# Patient Record
Sex: Female | Born: 1965
Health system: Southern US, Community
[De-identification: ages and names within clinical notes are randomized; demographics above are authoritative.]

## PROBLEM LIST (undated history)

## (undated) DIAGNOSIS — M797 Fibromyalgia: Secondary | ICD-10-CM

## (undated) DIAGNOSIS — M51369 Other intervertebral disc degeneration, lumbar region without mention of lumbar back pain or lower extremity pain: Secondary | ICD-10-CM

## (undated) DIAGNOSIS — D259 Leiomyoma of uterus, unspecified: Secondary | ICD-10-CM

## (undated) DIAGNOSIS — R209 Unspecified disturbances of skin sensation: Secondary | ICD-10-CM

## (undated) DIAGNOSIS — M5136 Other intervertebral disc degeneration, lumbar region: Secondary | ICD-10-CM

## (undated) DIAGNOSIS — G43909 Migraine, unspecified, not intractable, without status migrainosus: Secondary | ICD-10-CM

## (undated) DIAGNOSIS — J45909 Unspecified asthma, uncomplicated: Secondary | ICD-10-CM

## (undated) DIAGNOSIS — K219 Gastro-esophageal reflux disease without esophagitis: Secondary | ICD-10-CM

## (undated) HISTORY — DX: Migraine, unspecified, not intractable, without status migrainosus: G43.909

## (undated) HISTORY — PX: HAND SURGERY: SHX662

## (undated) HISTORY — DX: Unspecified disturbances of skin sensation: R20.9

## (undated) HISTORY — PX: CARPAL TUNNEL RELEASE: SHX101

## (undated) HISTORY — PX: CYSTECTOMY: SUR359

## (undated) HISTORY — DX: Other intervertebral disc degeneration, lumbar region: M51.36

## (undated) HISTORY — DX: Unspecified asthma, uncomplicated: J45.909

## (undated) HISTORY — PX: SHOULDER SURGERY: SHX246

---

## 1998-01-18 ENCOUNTER — Emergency Department (HOSPITAL_COMMUNITY): Admission: EM | Admit: 1998-01-18 | Discharge: 1998-01-18 | Payer: Self-pay | Admitting: Emergency Medicine

## 1998-01-25 ENCOUNTER — Inpatient Hospital Stay (HOSPITAL_COMMUNITY): Admission: EM | Admit: 1998-01-25 | Discharge: 1998-01-27 | Payer: Self-pay | Admitting: Emergency Medicine

## 1998-03-13 ENCOUNTER — Emergency Department (HOSPITAL_COMMUNITY): Admission: EM | Admit: 1998-03-13 | Discharge: 1998-03-13 | Payer: Self-pay | Admitting: Emergency Medicine

## 1998-03-16 ENCOUNTER — Ambulatory Visit (HOSPITAL_COMMUNITY): Admission: RE | Admit: 1998-03-16 | Discharge: 1998-03-16 | Payer: Self-pay | Admitting: Family Medicine

## 1998-04-04 ENCOUNTER — Ambulatory Visit (HOSPITAL_COMMUNITY): Admission: RE | Admit: 1998-04-04 | Discharge: 1998-04-04 | Payer: Self-pay | Admitting: Gastroenterology

## 1998-12-04 ENCOUNTER — Ambulatory Visit (HOSPITAL_COMMUNITY): Admission: RE | Admit: 1998-12-04 | Discharge: 1998-12-04 | Payer: Self-pay | Admitting: *Deleted

## 2000-02-13 ENCOUNTER — Other Ambulatory Visit: Admission: RE | Admit: 2000-02-13 | Discharge: 2000-02-13 | Payer: Self-pay | Admitting: Obstetrics and Gynecology

## 2000-02-13 ENCOUNTER — Other Ambulatory Visit: Admission: RE | Admit: 2000-02-13 | Discharge: 2000-02-13 | Payer: Self-pay | Admitting: Obstetrics & Gynecology

## 2000-02-15 ENCOUNTER — Ambulatory Visit (HOSPITAL_COMMUNITY): Admission: AD | Admit: 2000-02-15 | Discharge: 2000-02-15 | Payer: Self-pay | Admitting: Obstetrics & Gynecology

## 2000-02-15 ENCOUNTER — Encounter (INDEPENDENT_AMBULATORY_CARE_PROVIDER_SITE_OTHER): Payer: Self-pay

## 2000-04-15 ENCOUNTER — Encounter: Payer: Self-pay | Admitting: Internal Medicine

## 2000-04-15 ENCOUNTER — Encounter: Admission: RE | Admit: 2000-04-15 | Discharge: 2000-04-15 | Payer: Self-pay | Admitting: Internal Medicine

## 2000-10-16 ENCOUNTER — Encounter: Payer: Self-pay | Admitting: Internal Medicine

## 2000-10-16 ENCOUNTER — Encounter: Admission: RE | Admit: 2000-10-16 | Discharge: 2000-10-16 | Payer: Self-pay | Admitting: Internal Medicine

## 2000-10-21 ENCOUNTER — Encounter: Admission: RE | Admit: 2000-10-21 | Discharge: 2000-10-21 | Payer: Self-pay | Admitting: Internal Medicine

## 2000-10-21 ENCOUNTER — Encounter: Payer: Self-pay | Admitting: Internal Medicine

## 2001-04-30 ENCOUNTER — Encounter: Payer: Self-pay | Admitting: Internal Medicine

## 2001-04-30 ENCOUNTER — Encounter: Admission: RE | Admit: 2001-04-30 | Discharge: 2001-04-30 | Payer: Self-pay | Admitting: Internal Medicine

## 2001-05-05 ENCOUNTER — Encounter: Admission: RE | Admit: 2001-05-05 | Discharge: 2001-05-05 | Payer: Self-pay | Admitting: Internal Medicine

## 2001-05-05 ENCOUNTER — Encounter: Payer: Self-pay | Admitting: Internal Medicine

## 2001-05-10 ENCOUNTER — Other Ambulatory Visit: Admission: RE | Admit: 2001-05-10 | Discharge: 2001-05-10 | Payer: Self-pay | Admitting: Obstetrics and Gynecology

## 2001-05-12 ENCOUNTER — Encounter (INDEPENDENT_AMBULATORY_CARE_PROVIDER_SITE_OTHER): Payer: Self-pay

## 2001-05-12 ENCOUNTER — Ambulatory Visit (HOSPITAL_COMMUNITY): Admission: RE | Admit: 2001-05-12 | Discharge: 2001-05-12 | Payer: Self-pay | Admitting: Obstetrics and Gynecology

## 2001-07-30 ENCOUNTER — Encounter: Payer: Self-pay | Admitting: Internal Medicine

## 2001-07-30 ENCOUNTER — Encounter: Admission: RE | Admit: 2001-07-30 | Discharge: 2001-07-30 | Payer: Self-pay | Admitting: Internal Medicine

## 2001-08-15 ENCOUNTER — Emergency Department (HOSPITAL_COMMUNITY): Admission: EM | Admit: 2001-08-15 | Discharge: 2001-08-15 | Payer: Self-pay | Admitting: Emergency Medicine

## 2001-08-15 ENCOUNTER — Encounter: Payer: Self-pay | Admitting: Emergency Medicine

## 2001-11-26 ENCOUNTER — Encounter: Payer: Self-pay | Admitting: Internal Medicine

## 2001-11-26 ENCOUNTER — Encounter: Admission: RE | Admit: 2001-11-26 | Discharge: 2001-11-26 | Payer: Self-pay | Admitting: Internal Medicine

## 2001-12-23 ENCOUNTER — Encounter: Payer: Self-pay | Admitting: Emergency Medicine

## 2001-12-23 ENCOUNTER — Emergency Department (HOSPITAL_COMMUNITY): Admission: EM | Admit: 2001-12-23 | Discharge: 2001-12-23 | Payer: Self-pay | Admitting: Emergency Medicine

## 2003-02-09 ENCOUNTER — Encounter: Payer: Self-pay | Admitting: Internal Medicine

## 2003-02-09 ENCOUNTER — Encounter: Admission: RE | Admit: 2003-02-09 | Discharge: 2003-02-09 | Payer: Self-pay | Admitting: Internal Medicine

## 2004-02-28 ENCOUNTER — Ambulatory Visit (HOSPITAL_COMMUNITY): Admission: RE | Admit: 2004-02-28 | Discharge: 2004-02-28 | Payer: Self-pay | Admitting: Internal Medicine

## 2004-10-02 ENCOUNTER — Ambulatory Visit (HOSPITAL_COMMUNITY): Admission: RE | Admit: 2004-10-02 | Discharge: 2004-10-02 | Payer: Self-pay | Admitting: Anesthesiology

## 2006-10-16 ENCOUNTER — Emergency Department (HOSPITAL_COMMUNITY): Admission: EM | Admit: 2006-10-16 | Discharge: 2006-10-16 | Payer: Self-pay | Admitting: Emergency Medicine

## 2007-05-12 ENCOUNTER — Emergency Department (HOSPITAL_COMMUNITY): Admission: EM | Admit: 2007-05-12 | Discharge: 2007-05-12 | Payer: Self-pay | Admitting: Family Medicine

## 2007-09-18 ENCOUNTER — Emergency Department (HOSPITAL_COMMUNITY): Admission: EM | Admit: 2007-09-18 | Discharge: 2007-09-19 | Payer: Self-pay | Admitting: Emergency Medicine

## 2007-10-18 ENCOUNTER — Ambulatory Visit: Payer: Self-pay | Admitting: Gastroenterology

## 2007-10-18 LAB — CONVERTED CEMR LAB
AST: 16 units/L (ref 0–37)
Albumin: 3.7 g/dL (ref 3.5–5.2)
Amylase: 245 units/L — ABNORMAL HIGH (ref 27–131)
Lipase: 41 units/L (ref 11.0–59.0)
Total Bilirubin: 0.4 mg/dL (ref 0.3–1.2)
Total Protein: 7 g/dL (ref 6.0–8.3)

## 2007-10-26 ENCOUNTER — Ambulatory Visit: Payer: Self-pay | Admitting: Gastroenterology

## 2007-11-02 ENCOUNTER — Ambulatory Visit (HOSPITAL_COMMUNITY): Admission: RE | Admit: 2007-11-02 | Discharge: 2007-11-02 | Payer: Self-pay | Admitting: Gastroenterology

## 2007-11-15 ENCOUNTER — Ambulatory Visit: Payer: Self-pay | Admitting: Gastroenterology

## 2008-04-22 ENCOUNTER — Emergency Department (HOSPITAL_COMMUNITY): Admission: EM | Admit: 2008-04-22 | Discharge: 2008-04-22 | Payer: Self-pay | Admitting: Family Medicine

## 2008-06-02 ENCOUNTER — Emergency Department (HOSPITAL_COMMUNITY): Admission: EM | Admit: 2008-06-02 | Discharge: 2008-06-02 | Payer: Self-pay | Admitting: Emergency Medicine

## 2008-06-15 ENCOUNTER — Emergency Department (HOSPITAL_COMMUNITY): Admission: EM | Admit: 2008-06-15 | Discharge: 2008-06-16 | Payer: Self-pay | Admitting: Emergency Medicine

## 2008-06-26 ENCOUNTER — Emergency Department (HOSPITAL_COMMUNITY): Admission: EM | Admit: 2008-06-26 | Discharge: 2008-06-26 | Payer: Self-pay | Admitting: Emergency Medicine

## 2008-10-22 ENCOUNTER — Encounter (INDEPENDENT_AMBULATORY_CARE_PROVIDER_SITE_OTHER): Payer: Self-pay | Admitting: *Deleted

## 2008-10-22 ENCOUNTER — Emergency Department (HOSPITAL_COMMUNITY): Admission: EM | Admit: 2008-10-22 | Discharge: 2008-10-23 | Payer: Self-pay | Admitting: Emergency Medicine

## 2008-10-23 ENCOUNTER — Encounter (INDEPENDENT_AMBULATORY_CARE_PROVIDER_SITE_OTHER): Payer: Self-pay | Admitting: *Deleted

## 2008-10-30 ENCOUNTER — Telehealth: Payer: Self-pay | Admitting: Gastroenterology

## 2008-10-30 ENCOUNTER — Emergency Department (HOSPITAL_COMMUNITY): Admission: EM | Admit: 2008-10-30 | Discharge: 2008-10-30 | Payer: Self-pay | Admitting: Emergency Medicine

## 2008-10-30 ENCOUNTER — Encounter (INDEPENDENT_AMBULATORY_CARE_PROVIDER_SITE_OTHER): Payer: Self-pay | Admitting: *Deleted

## 2008-10-31 DIAGNOSIS — R51 Headache: Secondary | ICD-10-CM

## 2008-10-31 DIAGNOSIS — R109 Unspecified abdominal pain: Secondary | ICD-10-CM | POA: Insufficient documentation

## 2008-10-31 DIAGNOSIS — R519 Headache, unspecified: Secondary | ICD-10-CM | POA: Insufficient documentation

## 2008-10-31 DIAGNOSIS — F329 Major depressive disorder, single episode, unspecified: Secondary | ICD-10-CM

## 2008-10-31 DIAGNOSIS — M1990 Unspecified osteoarthritis, unspecified site: Secondary | ICD-10-CM | POA: Insufficient documentation

## 2008-10-31 DIAGNOSIS — IMO0001 Reserved for inherently not codable concepts without codable children: Secondary | ICD-10-CM

## 2008-10-31 DIAGNOSIS — Z8669 Personal history of other diseases of the nervous system and sense organs: Secondary | ICD-10-CM

## 2008-11-01 ENCOUNTER — Ambulatory Visit: Payer: Self-pay | Admitting: Gastroenterology

## 2008-11-08 ENCOUNTER — Telehealth: Payer: Self-pay | Admitting: Gastroenterology

## 2008-11-08 DIAGNOSIS — R141 Gas pain: Secondary | ICD-10-CM | POA: Insufficient documentation

## 2008-11-08 DIAGNOSIS — R1013 Epigastric pain: Secondary | ICD-10-CM

## 2008-11-08 DIAGNOSIS — R142 Eructation: Secondary | ICD-10-CM

## 2008-11-08 DIAGNOSIS — R143 Flatulence: Secondary | ICD-10-CM

## 2008-11-24 ENCOUNTER — Ambulatory Visit (HOSPITAL_COMMUNITY): Admission: RE | Admit: 2008-11-24 | Discharge: 2008-11-24 | Payer: Self-pay | Admitting: Gastroenterology

## 2008-12-06 ENCOUNTER — Ambulatory Visit: Payer: Self-pay | Admitting: Gastroenterology

## 2008-12-06 ENCOUNTER — Encounter: Payer: Self-pay | Admitting: Gastroenterology

## 2009-04-03 ENCOUNTER — Emergency Department (HOSPITAL_COMMUNITY): Admission: EM | Admit: 2009-04-03 | Discharge: 2009-04-03 | Payer: Self-pay | Admitting: Emergency Medicine

## 2009-04-04 ENCOUNTER — Emergency Department (HOSPITAL_COMMUNITY): Admission: EM | Admit: 2009-04-04 | Discharge: 2009-04-04 | Payer: Self-pay | Admitting: Family Medicine

## 2009-04-08 ENCOUNTER — Emergency Department (HOSPITAL_COMMUNITY): Admission: EM | Admit: 2009-04-08 | Discharge: 2009-04-08 | Payer: Self-pay | Admitting: Emergency Medicine

## 2009-04-28 ENCOUNTER — Emergency Department (HOSPITAL_COMMUNITY): Admission: EM | Admit: 2009-04-28 | Discharge: 2009-04-28 | Payer: Self-pay | Admitting: Emergency Medicine

## 2009-05-29 ENCOUNTER — Encounter: Admission: RE | Admit: 2009-05-29 | Discharge: 2009-05-29 | Payer: Self-pay | Admitting: Specialist

## 2009-08-09 ENCOUNTER — Emergency Department (HOSPITAL_COMMUNITY): Admission: EM | Admit: 2009-08-09 | Discharge: 2009-08-09 | Payer: Self-pay | Admitting: Emergency Medicine

## 2009-08-20 ENCOUNTER — Emergency Department (HOSPITAL_COMMUNITY): Admission: EM | Admit: 2009-08-20 | Discharge: 2009-08-21 | Payer: Self-pay | Admitting: Emergency Medicine

## 2009-12-04 ENCOUNTER — Emergency Department (HOSPITAL_COMMUNITY): Admission: EM | Admit: 2009-12-04 | Discharge: 2009-12-04 | Payer: Self-pay | Admitting: Emergency Medicine

## 2010-01-08 ENCOUNTER — Emergency Department (HOSPITAL_COMMUNITY): Admission: EM | Admit: 2010-01-08 | Discharge: 2010-01-08 | Payer: Self-pay | Admitting: Emergency Medicine

## 2010-02-06 ENCOUNTER — Emergency Department (HOSPITAL_COMMUNITY): Admission: EM | Admit: 2010-02-06 | Discharge: 2010-02-06 | Payer: Self-pay | Admitting: Emergency Medicine

## 2010-04-30 ENCOUNTER — Emergency Department (HOSPITAL_COMMUNITY): Admission: EM | Admit: 2010-04-30 | Discharge: 2010-05-01 | Payer: Self-pay | Admitting: Emergency Medicine

## 2010-08-11 ENCOUNTER — Emergency Department (HOSPITAL_COMMUNITY)
Admission: EM | Admit: 2010-08-11 | Discharge: 2010-08-11 | Payer: Self-pay | Source: Home / Self Care | Admitting: Emergency Medicine

## 2010-10-17 LAB — COMPREHENSIVE METABOLIC PANEL
ALT: 10 U/L (ref 0–35)
AST: 16 U/L (ref 0–37)
BUN: 8 mg/dL (ref 6–23)
CO2: 22 mEq/L (ref 19–32)
GFR calc Af Amer: >60 mL/min (ref >60)
GFR calc non Af Amer: >60 mL/min (ref >60)
Glucose, Bld: 90 mg/dL (ref 70–99)
Potassium: 3.3 mEq/L — ABNORMAL LOW (ref 3.5–5.1)

## 2010-10-17 LAB — CBC
HCT: 37.5 % (ref 36.0–46.0)
MCHC: 32.4 g/dL (ref 30.0–36.0)
MCV: 80.5 fL (ref 78.0–100.0)
Platelets: 357 10*3/uL (ref 150–400)
RBC: 4.66 MIL/uL (ref 3.87–5.11)
RDW: 14.8 % (ref 11.5–15.5)
WBC: 7.6 10*3/uL (ref 4.0–10.5)

## 2010-10-17 LAB — DIFFERENTIAL
Basophils Relative: 0 % (ref 0–1)
Eosinophils Absolute: 0.1 10*3/uL (ref 0.0–0.7)
Lymphocytes Relative: 26 % (ref 12–46)
Neutro Abs: 5.2 10*3/uL (ref 1.7–7.7)
Neutrophils Relative %: 69 % (ref 43–77)

## 2010-10-17 LAB — URINALYSIS, ROUTINE W REFLEX MICROSCOPIC
Bilirubin Urine: NEGATIVE
Glucose, UA: NEGATIVE mg/dL
Nitrite: NEGATIVE
pH: 6 (ref 5.0–8.0)

## 2010-10-20 LAB — CBC
HCT: 39.7 % (ref 36.0–46.0)
HCT: 39.5 % (ref 36.0–46.0)
Hemoglobin: 13.1 g/dL (ref 12.0–15.0)
MCH: 25.7 pg — ABNORMAL LOW (ref 26.0–34.0)
MCHC: 32.8 g/dL (ref 30.0–36.0)
MCHC: 33.3 g/dL (ref 30.0–36.0)
MCV: 78.3 fL (ref 78.0–100.0)
MCV: 77.6 fL — ABNORMAL LOW (ref 78.0–100.0)
MCV: 78.6 fL (ref 78.0–100.0)
Platelets: 282 10*3/uL (ref 150–400)
Platelets: 347 10*3/uL (ref 150–400)
RBC: 5.09 MIL/uL (ref 3.87–5.11)
RBC: 5.11 MIL/uL (ref 3.87–5.11)
RDW: 14.9 % (ref 11.5–15.5)
RDW: 14.8 % (ref 11.5–15.5)
WBC: 6.3 10*3/uL (ref 4.0–10.5)
WBC: 9.2 10*3/uL (ref 4.0–10.5)
WBC: 9.8 10*3/uL (ref 4.0–10.5)

## 2010-10-20 LAB — COMPREHENSIVE METABOLIC PANEL
ALT: 18 U/L (ref 0–35)
ALT: 16 U/L (ref 0–35)
AST: 20 U/L (ref 0–37)
AST: 20 U/L (ref 0–37)
Albumin: 4.2 g/dL (ref 3.5–5.2)
Alkaline Phosphatase: 37 U/L — ABNORMAL LOW (ref 39–117)
Alkaline Phosphatase: 43 U/L (ref 39–117)
Alkaline Phosphatase: 40 U/L (ref 39–117)
BUN: 15 mg/dL (ref 6–23)
BUN: 7 mg/dL (ref 6–23)
CO2: 27 mEq/L (ref 19–32)
CO2: 25 mEq/L (ref 19–32)
Calcium: 9.6 mg/dL (ref 8.4–10.5)
Chloride: 100 mEq/L (ref 96–112)
Chloride: 102 mEq/L (ref 96–112)
Creatinine, Ser: 0.66 mg/dL (ref 0.4–1.2)
Creatinine, Ser: 0.66 mg/dL (ref 0.4–1.2)
GFR calc Af Amer: >60 mL/min (ref >60)
GFR calc Af Amer: >60 mL/min (ref >60)
GFR calc non Af Amer: >60 mL/min (ref >60)
GFR calc non Af Amer: >60 mL/min (ref >60)
Glucose, Bld: 89 mg/dL (ref 70–99)
Glucose, Bld: 114 mg/dL — ABNORMAL HIGH (ref 70–99)
Potassium: 3.4 mEq/L — ABNORMAL LOW (ref 3.5–5.1)
Potassium: 3.7 mEq/L (ref 3.5–5.1)
Potassium: 3.5 mEq/L (ref 3.5–5.1)
Sodium: 135 mEq/L (ref 135–145)
Sodium: 133 mEq/L — ABNORMAL LOW (ref 135–145)
Total Bilirubin: 0.9 mg/dL (ref 0.3–1.2)
Total Bilirubin: 0.6 mg/dL (ref 0.3–1.2)
Total Protein: 8.2 g/dL (ref 6.0–8.3)
Total Protein: 8.4 g/dL — ABNORMAL HIGH (ref 6.0–8.3)

## 2010-10-20 LAB — URINALYSIS, ROUTINE W REFLEX MICROSCOPIC
Bilirubin Urine: NEGATIVE
Bilirubin Urine: NEGATIVE
Glucose, UA: NEGATIVE mg/dL
Glucose, UA: NEGATIVE mg/dL
Glucose, UA: NEGATIVE mg/dL
Hgb urine dipstick: NEGATIVE
Hgb urine dipstick: NEGATIVE
Ketones, ur: 40 mg/dL — AB
Ketones, ur: 40 mg/dL — AB
Ketones, ur: 15 mg/dL — AB
Nitrite: NEGATIVE
Protein, ur: NEGATIVE mg/dL
Protein, ur: NEGATIVE mg/dL
Protein, ur: NEGATIVE mg/dL
Specific Gravity, Urine: 1.026 (ref 1.005–1.030)
Urobilinogen, UA: 0.2 mg/dL (ref 0.0–1.0)
Urobilinogen, UA: 0.2 mg/dL (ref 0.0–1.0)
pH: 6.5 (ref 5.0–8.0)

## 2010-10-20 LAB — POCT PREGNANCY, URINE: Preg Test, Ur: NEGATIVE

## 2010-10-20 LAB — DIFFERENTIAL
Basophils Absolute: 0 10*3/uL (ref 0.0–0.1)
Basophils Absolute: 0 10*3/uL (ref 0.0–0.1)
Basophils Absolute: 0 10*3/uL (ref 0.0–0.1)
Basophils Relative: 0 % (ref 0–1)
Basophils Relative: 0 % (ref 0–1)
Eosinophils Absolute: 0 10*3/uL (ref 0.0–0.7)
Eosinophils Absolute: 0 10*3/uL (ref 0.0–0.7)
Eosinophils Relative: 0 % (ref 0–5)
Eosinophils Relative: 1 % (ref 0–5)
Lymphocytes Relative: 31 % (ref 12–46)
Lymphocytes Relative: 13 % (ref 12–46)
Lymphs Abs: 1.2 10*3/uL (ref 0.7–4.0)
Monocytes Absolute: 0.3 10*3/uL (ref 0.1–1.0)
Monocytes Relative: 5 % (ref 3–12)
Monocytes Relative: 3 % (ref 3–12)
Neutro Abs: 3.9 10*3/uL (ref 1.7–7.7)
Neutro Abs: 7.7 10*3/uL (ref 1.7–7.7)
Neutrophils Relative %: 62 % (ref 43–77)
Neutrophils Relative %: 83 % — ABNORMAL HIGH (ref 43–77)

## 2010-10-20 LAB — LIPASE, BLOOD
Lipase: 32 U/L (ref 11–59)
Lipase: 43 U/L (ref 11–59)

## 2010-10-20 LAB — WET PREP, GENITAL: Trich, Wet Prep: NONE SEEN

## 2010-10-20 LAB — RAPID URINE DRUG SCREEN, HOSP PERFORMED
Amphetamines: NOT DETECTED
Barbiturates: NOT DETECTED
Benzodiazepines: NOT DETECTED

## 2010-10-21 LAB — CBC
HCT: 39.2 % (ref 36.0–46.0)
MCHC: 32.5 g/dL (ref 30.0–36.0)
MCV: 78.6 fL (ref 78.0–100.0)
RBC: 4.98 MIL/uL (ref 3.87–5.11)

## 2010-10-21 LAB — DIFFERENTIAL
Eosinophils Relative: 0 % (ref 0–5)
Lymphocytes Relative: 12 % (ref 12–46)
Lymphs Abs: 1.1 10*3/uL (ref 0.7–4.0)
Monocytes Absolute: 0.3 10*3/uL (ref 0.1–1.0)
Monocytes Relative: 3 % (ref 3–12)

## 2010-10-21 LAB — COMPREHENSIVE METABOLIC PANEL
ALT: 14 U/L (ref 0–35)
Albumin: 4.8 g/dL (ref 3.5–5.2)
Alkaline Phosphatase: 34 U/L — ABNORMAL LOW (ref 39–117)
CO2: 22 mEq/L (ref 19–32)
GFR calc Af Amer: >60 mL/min (ref >60)
Glucose, Bld: 96 mg/dL (ref 70–99)
Total Protein: 8.4 g/dL — ABNORMAL HIGH (ref 6.0–8.3)

## 2010-10-21 LAB — URINALYSIS, ROUTINE W REFLEX MICROSCOPIC
Bilirubin Urine: NEGATIVE
Hgb urine dipstick: NEGATIVE
Ketones, ur: 15 mg/dL — AB
Nitrite: NEGATIVE
Urobilinogen, UA: 0.2 mg/dL (ref 0.0–1.0)

## 2010-10-22 LAB — DIFFERENTIAL
Eosinophils Relative: 0 % (ref 0–5)
Lymphocytes Relative: 15 % (ref 12–46)
Lymphs Abs: 1 10*3/uL (ref 0.7–4.0)
Monocytes Absolute: 0.3 10*3/uL (ref 0.1–1.0)
Monocytes Relative: 4 % (ref 3–12)

## 2010-10-22 LAB — URINALYSIS, ROUTINE W REFLEX MICROSCOPIC
Glucose, UA: NEGATIVE mg/dL
Hgb urine dipstick: NEGATIVE
Specific Gravity, Urine: 1.028 (ref 1.005–1.030)
Urobilinogen, UA: 0.2 mg/dL (ref 0.0–1.0)

## 2010-10-22 LAB — CBC
MCV: 78.8 fL (ref 78.0–100.0)
Platelets: 302 10*3/uL (ref 150–400)
WBC: 6.8 10*3/uL (ref 4.0–10.5)

## 2010-10-22 LAB — COMPREHENSIVE METABOLIC PANEL
AST: 18 U/L (ref 0–37)
Albumin: 4.1 g/dL (ref 3.5–5.2)
Calcium: 9.2 mg/dL (ref 8.4–10.5)
Chloride: 109 mEq/L (ref 96–112)
Creatinine, Ser: 0.76 mg/dL (ref 0.4–1.2)
GFR calc Af Amer: >60 mL/min (ref >60)
Total Bilirubin: 0.7 mg/dL (ref 0.3–1.2)
Total Protein: 8.4 g/dL — ABNORMAL HIGH (ref 6.0–8.3)

## 2010-10-22 LAB — URINE MICROSCOPIC-ADD ON

## 2010-11-08 LAB — POCT URINALYSIS DIP (DEVICE)
Glucose, UA: NEGATIVE mg/dL
Ketones, ur: NEGATIVE mg/dL
Nitrite: NEGATIVE
Protein, ur: NEGATIVE mg/dL
Protein, ur: NEGATIVE mg/dL
Specific Gravity, Urine: 1.02 (ref 1.005–1.030)
Urobilinogen, UA: 0.2 mg/dL (ref 0.0–1.0)
pH: 6 (ref 5.0–8.0)

## 2010-11-08 LAB — GC/CHLAMYDIA PROBE AMP, GENITAL
Chlamydia, DNA Probe: NEGATIVE
Chlamydia, DNA Probe: NEGATIVE
GC Probe Amp, Genital: NEGATIVE

## 2010-11-08 LAB — WET PREP, GENITAL
Trich, Wet Prep: NONE SEEN
WBC, Wet Prep HPF POC: NONE SEEN
Yeast Wet Prep HPF POC: NONE SEEN

## 2010-11-14 LAB — DIFFERENTIAL
Basophils Absolute: 0 10*3/uL (ref 0.0–0.1)
Basophils Absolute: 0 10*3/uL (ref 0.0–0.1)
Basophils Relative: 0 % (ref 0–1)
Eosinophils Relative: 0 % (ref 0–5)
Eosinophils Relative: 1 % (ref 0–5)
Lymphocytes Relative: 14 % (ref 12–46)
Lymphs Abs: 1.1 10*3/uL (ref 0.7–4.0)
Monocytes Absolute: 0.1 10*3/uL (ref 0.1–1.0)
Monocytes Absolute: 0.4 10*3/uL (ref 0.1–1.0)
Monocytes Relative: 1 % — ABNORMAL LOW (ref 3–12)
Neutro Abs: 6.3 10*3/uL (ref 1.7–7.7)
Neutro Abs: 8.9 10*3/uL — ABNORMAL HIGH (ref 1.7–7.7)

## 2010-11-14 LAB — COMPREHENSIVE METABOLIC PANEL
ALT: 15 U/L (ref 0–35)
AST: 16 U/L (ref 0–37)
Albumin: 4.1 g/dL (ref 3.5–5.2)
Alkaline Phosphatase: 42 U/L (ref 39–117)
BUN: 8 mg/dL (ref 6–23)
BUN: 9 mg/dL (ref 6–23)
CO2: 24 mEq/L (ref 19–32)
Calcium: 9.2 mg/dL (ref 8.4–10.5)
Calcium: 9.5 mg/dL (ref 8.4–10.5)
Chloride: 102 mEq/L (ref 96–112)
Creatinine, Ser: 0.7 mg/dL (ref 0.4–1.2)
GFR calc Af Amer: >60 mL/min (ref >60)
GFR calc non Af Amer: >60 mL/min (ref >60)
Glucose, Bld: 103 mg/dL — ABNORMAL HIGH (ref 70–99)
Sodium: 136 mEq/L (ref 135–145)
Total Bilirubin: 0.9 mg/dL (ref 0.3–1.2)
Total Protein: 7.5 g/dL (ref 6.0–8.3)
Total Protein: 7.7 g/dL (ref 6.0–8.3)

## 2010-11-14 LAB — CBC
HCT: 41.3 % (ref 36.0–46.0)
HCT: 42.8 % (ref 36.0–46.0)
Hemoglobin: 13.8 g/dL (ref 12.0–15.0)
MCHC: 32.2 g/dL (ref 30.0–36.0)
MCV: 78.9 fL (ref 78.0–100.0)
Platelets: 374 10*3/uL (ref 150–400)
RBC: 5.43 MIL/uL — ABNORMAL HIGH (ref 3.87–5.11)
RDW: 14.8 % (ref 11.5–15.5)
RDW: 15.3 % (ref 11.5–15.5)
WBC: 7.6 10*3/uL (ref 4.0–10.5)

## 2010-11-14 LAB — URINE MICROSCOPIC-ADD ON

## 2010-11-14 LAB — URINALYSIS, ROUTINE W REFLEX MICROSCOPIC
Bilirubin Urine: NEGATIVE
Glucose, UA: NEGATIVE mg/dL
Hgb urine dipstick: NEGATIVE
Hgb urine dipstick: NEGATIVE
Leukocytes, UA: NEGATIVE
Protein, ur: 30 mg/dL — AB
Protein, ur: NEGATIVE mg/dL
Specific Gravity, Urine: 1.029 (ref 1.005–1.030)
Urobilinogen, UA: 0.2 mg/dL (ref 0.0–1.0)
Urobilinogen, UA: 0.2 mg/dL (ref 0.0–1.0)

## 2010-12-17 NOTE — Assessment & Plan Note (Signed)
 HEALTHCARE                         GASTROENTEROLOGY OFFICE NOTE   NAME:Martel, ALEXSYS ESKIN                        MRN:          [id number]  DATE:11/15/2007                            DOB:          04/06/1966    PROBLEM:  Abdominal pain.   Ms. Iyer has returned for a scheduled followup. She continues to  complain of intermittent sharp periumbilical and mid epigastric pain.  The symptoms have not improved with high dose Nexium. Upper endoscopy  was entirely normal. A CT scan demonstrated uterine fibroid and small  liver cysts.   PHYSICAL EXAMINATION:  Pulse 88, blood pressure 100/60, weight 117.   IMPRESSION:  Nonspecific abdominal pain. I do not think this is related  to pancreatitis.   RECOMMENDATIONS:  1. Trial of Levbid 0.375 mg twice a day.  2. Avoid nonsteroidals.  3. Decrease Nexium to 40 mg daily.     Barbette Hair. Arlyce Dice, MD,FACG  Electronically Signed    RDK/MedQ  DD: 11/15/2007  DT: 11/15/2007  Job #: [id number]

## 2010-12-17 NOTE — Assessment & Plan Note (Signed)
[facility]                         GASTROENTEROLOGY OFFICE NOTE   NAME:Kerry Ortiz, Kerry Ortiz                        MRN:          [id number]  DATE:10/18/2007                            DOB:          11-26-1965    This is an office add-on note.   PROBLEM:  Followup per ER for pancreatitis.   HISTORY:  Kerry Ortiz is a 45 year old African American female known remotely  to Dr. Arlyce Dice who was last seen here in 1999.  At that time she was  evaluated for recurrent epigastric pain.  Though the patient tells me  today that she has never had pancreatitis, her old records relate that  she did have an episode of pancreatitis apparently at the age of 49.  She had undergone ERCP here in September 1999, which was normal and had  an upper endoscopy in 1999 as well which was normal.  She apparently has  previous history of peptic ulcer disease as well.  She is maintained on  chronic Nexium 40 mg per day.  The patient had an acute episode of epigastric pain which took her to  the emergency room on September 19, 2007.  Labs including CBC, C-MET, and  amylase and lipase were all negative with the exception of an amylase  elevated at 158.  Her lipase was normal at 49.  Upper abdominal  ultrasound was done which was a normal exam with the gallbladder  appearing normal.  There was no evidence of common bile duct dilation  and pancreas was also read as normal.  The patient was given Vicodin to  use as needed for pain and asked to follow up with GI.  Now, 3.5 weeks later she states that she is still having epigastric pain  though not as intense as it was initially.  She says the pain at that  time was 10/10 and is now 7-8 out of 10.  She has used the hydrocodone  and finds that helps the pain a bit but does not completely alleviate  it.  She is finding eating difficult and has been eating very light as  this tends to exacerbate the pain.  She has not had any vomiting, though  does  complain of nausea and states that her weight has been stable.  She  has not had any associated fever or chills.  There is no radiation of  her pain to her back or her chest.  She did noticed a small amount of  bright red blood per rectum today on the tissue.  She does take  occasional ibuprofen and is on a combination of hydrocodone/ibuprofen  product which she uses p.r.n.  The patient denies any alcohol use at all.  There is no family history  of pancreatic or gallbladder disease that she is aware of.  She has not  had problems with hyperlipidemia in the past.  She has not been on any  new medications.   CURRENT MEDICATIONS:  1. Nexium 40 mg daily.  2. Flexeril 10 p.o. daily.  3. Lyrica 75 b.i.d.  4. Hydrocodone/ibuprofen 7.5/200 p.r.n.   ALLERGIES:  No known drug allergies.   PAST HISTORY:  Pertinent for:  1. Fibromyalgia.  2. Depression.  3. Osteoarthritis.  4. Chronic headaches.  5. She has had a rotator cuff repair.  6. Carpal tunnel repair.   SOCIAL HISTORY:  The patient is single, has one child who accompanies  her today.  She is disabled because of her fibromyalgia.  She is a  nonsmoker, nondrinker.   FAMILY HISTORY:  Positive for alcoholism in her father.  Negative for  other GI diseases.   REVIEW OF SYSTEMS:  Positive for fatigue and lower extremity edema both  of which she attributes to the fibromyalgia.  She does have intermittent  problems with insomnia and has shortness of breath intermittently.  Otherwise complete review of systems negative except as in HPI.   PHYSICAL EXAMINATION:  GENERAL: Well developed African American female  in no acute distress.  VITAL SIGNS:  Height is 5 feet 2 inches, weight is 114.8, blood pressure  100/60, pulse is 84.  HEENT:  Nontraumatic, normocephalic.  EOMI.  PERRLA.  Sclerae are  anicteric.  NECK:  Supple.  CARDIOVASCULAR:  Regular rate and rhythm with S1 and S2.  No murmur,  rub, or gallop.  PULMONARY:  Clear to A&P.   ABDOMEN:  Soft.  She is mildly tender in the epigastrium.  There is no  palpable mass or hepatosplenomegaly.  No guarding or rebound.  RECTAL:  Not done today.   IMPRESSION:  A 45 year old African American female with one month  history of epigastric pain, initially diagnosed with pancreatitis,  however, this diagnosis was arrived at primarily by a mildly elevated  amylase.  Concerned at this point that her pain may be due to gastritis  or peptic ulcer disease rather than pancreatitis.   PLAN:  1. Increase Nexium to 40 mg b.i.d.  2. Stop ibuprofen products.  3. Schedule upper endoscopy with Dr. Arlyce Dice.  4. Repeat hepatic panel, amylase, and lipase today.      Kerry Gip, PA-C  Electronically Signed      Hedwig Morton. Juanda Chance, MD  Electronically Signed   AE/MedQ  DD: 10/18/2007  DT: 10/18/2007  Job #: [id number]   cc:   Barbette Hair. Arlyce Dice, MD,FACG

## 2010-12-20 NOTE — H&P (Signed)
[facility]  Patient:    Kerry Ortiz, Kerry Ortiz Visit Number: [id number] MRN: [id number]          Service Type: DSU Location: [facility] Attending Physician:  Wandalee Ferdinand Dictated by:   Rudy Jew Ashley Royalty, M.D. Admit Date:  05/12/2001                           History and Physical  HISTORY OF PRESENT ILLNESS:   The patient is a 45 year old gravida 1, para 0, AB1 with a several-week history of pelvic pain.  She was referred through the courtesy of Velna Hatchet, M.D. for same.  Dr. Allyne Gee ordered an ultrasound evaluation of the patients pelvis on April 30, 2001.  The study revealed a fibroid uterus with a right ovarian cyst 2.2 cm in greatest diameter.  The left ovary was essentially normal.  The patient is here for diagnostic/operative laparoscopy to remove the right ovarian cyst and evaluate the reason for her pelvic pain.  She stated the symptoms were sufficiently debilitating to warrant immediate surgery as opposed to expectant management with repeat ultrasound.  MEDICATIONS:                  1. Folic acid.                               2. Nexium.                               3. Vicodin.                               4. Metronidazole for vaginitis.  PAST MEDICAL HISTORY:         1. Hiatal hernia.                               2. History of pancreatitis.  PAST SURGICAL HISTORY:        None.  ALLERGIES:                    None.  FAMILY HISTORY:               Positive for hypertension and diabetes.  SOCIAL HISTORY:               The patient denies the use of tobacco or significant alcohol.  REVIEW OF SYSTEMS:            Noncontributory.  PHYSICAL EXAMINATION:  GENERAL:                      Well-developed, well-nourished, pleasant black female in no acute distress.  VITAL SIGNS:                  Afebrile with stable vital signs.  SKIN:                         Warm and dry without lesions.  LYMPH:                        There is no  supraclavicular, cervical or inguinal adenopathy.  HEENT:  Normocephalic.  NECK:                         Supple without thyromegaly.  CHEST:                        Lungs are clear.  CARDIAC:                      Regular rate and rhythm without murmurs, rubs, or gallops.  BREASTS:                      Deferred.  ABDOMEN:                      Soft and nontender without masses or organomegaly.  Bowel sounds are active.  MUSCULOSKELETAL:              Full range of motion without edema, cyanosis or ______.  PELVIC:                       Deferred until examination under anesthesia.  IMPRESSION:                   1. Right ovarian cyst.                               2. Pelvic pain, possibly secondary to #1, though                                  objective findings do not seem to explain the                                  entirety of the patients pelvic symptoms.  PLAN:                         Diagnostic/operative laparoscopy, right ovarian cystectomy and indicated procedures.  The risks, benefits, complications and alternatives were fully discussed with the patient.  She states she understands and accepts.  Questions were invited and answered.Dictated by: Rudy Jew Ashley Royalty, M.D. Attending Physician:  Wandalee Ferdinand DD:  05/12/01 TD:  05/12/01 Job: [id number]

## 2010-12-20 NOTE — Op Note (Signed)
[facility]  Patient:    Kerry Ortiz, Kerry Ortiz [facility] Visit Number: [id number] MRN: [id number]          Service Type: Attending:  Rudy Jew. Ashley Royalty, M.D. Dictated by:   Rudy Jew Ashley Royalty, M.D.                             Operative Report  PREOPERATIVE DIAGNOSES:       1. Right ovarian cyst.                               2. Pelvic pain possibly secondary to #1,                                  debilitating.  POSTOPERATIVE DIAGNOSES:      1. Right ovarian cyst, pathology pending.                               2. Pelvic pain possibly secondary to #1,                                  debilitating.                               3. No evidence of adhesions or endometriosis.  PROCEDURES:                   1. Diagnostic/operative laparoscopy.                               2. Right ovarian cystectomy.  SURGEON:                      Rudy Jew. Ashley Royalty, M.D.  ANESTHESIA:                   General.  ESTIMATED BLOOD LOSS:         Less than 50 cc.  COMPLICATIONS:                None.  PACKS AND DRAINS:             None.  DESCRIPTION OF PROCEDURE:     The patient was taken to the operating room and placed in the dorsal supine position.  After adequate general endotracheal anesthesia was administered, she was placed in the lithotomy position, prepped and draped in the usual manner for abdominal and vaginal surgery.  A posterior weighted retractor was placed in the vagina and the anterior lip of the cervix was grasped with a single-toooth tenaculum.  A Jarcho uterine manipulator was placed in the cervix.  The bladder was drained with a red rubber catheter.  A 1.5 cm infraumbilical incision was made in the transverse plane.  The laparoscopic trocar was then placed into the abdominal cavity.  Its location was verified by placement of the laparoscope.  There was no evidence of any trauma.  Pneumoperitoneum was created with CO2 and maintained throughout the procedure.  Next,  two 5 mm suprapubic trocars was placed in the left and right lower quadrants respectively using transillumination and direct  visualization. The pelvis was thoroughly surveyed.  The uterus was normal size, shape and contour without evidence of any endometriosis or external fibroids.  The fallopian tubes were bilaterally normal size, shape, contour and length with luxuriant fimbria.  The left ovary was normal size, shape and contour without evidence of any cysts or endometriosis.  The right ovary was approximately 4 x 3 x 3 cm with an approximate 2 cm cyst on the superior aspect. Appropriate photos were obtained.  The remainder of the peritoneal surfaces were smooth and glistening.  I could appreciate no evidence of any peritoneal endometriosis or other adhesions in the abdomen or pelvis.  Attention was then turned to the ovarian cystectomy.  The YAG laser was employed, using the GRP6 probe at 15 watts of power.  The cyst was circumscribed with the laser.  The cyst was then removed mostly intact and somewhat in a piecemeal fashion and submitted to pathology for histologic studies.  Hemostasis was obtained with bipolar cautery.  Hemostasis was noted. Copious irrigation was accomplished with the Nezhat suction irrigator. Hemostasis was once again noted.  Next, the abdominal instruments were removed and pneumoperitoneum evacuated.  Fascial defects were closed with 0 Vicryl in interrupted fashion.  The inferior skin incisions were closed with Dermabond. The superior skin incision was closed with 3-0 chromic in subcuticular fashion.  Approximately 7 cc of 0.25% Bupivacaine was instilled into the incisions to aid in postoperative analgesia.  The patient tolerated the procedure extremely well and was returned to the recovery room in good condition. Dictated by:   Rudy Jew Ashley Royalty, M.D. Attending:  Rudy Jew Ashley Royalty, M.D. DD:  05/12/01 TD:  05/12/01 Job: [id number]

## 2010-12-20 NOTE — Op Note (Signed)
[facility]  Patient:    Kerry Ortiz, Kerry Ortiz                        MRN: [id number] Proc. Date: 02/15/00 Adm. Date:  [id number] Attending:  Mickle Mallory                           Operative Report  PREOPERATIVE DIAGNOSIS:       Incomplete abortion - blood type B positive.  POSTOPERATIVE DIAGNOSIS:      Incomplete abortion - blood type B positive. Pathology pending.  OPERATION:                    Suction dilatation and curettage.  SURGEON:                      Gerrit Friends. Aldona Bar, M.D.  ASSISTANT:  ANESTHESIA:                   Intravenous conscious sedation and local paracervical block with 1% Xylocaine without epinephrine.  ESTIMATED BLOOD LOSS:  INDICATIONS:                  This primigravida who was seen in the office on July 13, with a history of some spotting had an ultrasound which showed what sounds ike an empty sac at seven weeks gestation.  She called on the morning of July 14, with increased bleeding, cramping, and clotting, and was advised to come to [facility] where she was seen at 8:30 a.m. and determined that she ad an incomplete abortion - the sac was visualized inside the uterus and there was  moderate bleeding from the cervix noted.  D&C was posted with the OR accordingly.  DESCRIPTION OF PROCEDURE:     The patient was finally taken to the operating room where after she was prepped and draped, she was given intravenous conscious sedation.  She was placed in the modified lithotomy position in the short Allen  stirrups.  A speculum was inserted in the vagina and the single tooth tenaculum was placed on the cervix for uterine stability during the procedure.  A paracervical block was carried out at this time using 12 cc of 1% Xylocaine without epinephrine and thereafter with minimal difficulty the internal os was dilated to a #25 Pratt dilator.  A #8 suction curet was then placed in the cavity and  after appropriately being tested, the cavity was thoroughly, gently, and systematically evacuated of all products of conception and this was confirmed by curettage with a small standard curet.  Further suctioning produced no tissue and at this time the patient was felt to be complete and stable and the procedure was terminated. Examination under anesthesia at this time found a uterus that was upper limits of normal size and firm.  The sacral promentory was very easy to feel.  The patient was taken to the recovery room in satisfactory condition having tolerated the procedure well.  Estimated blood loss was 50 cc.  All sponge, needle, and instrument counts were correct x 2.  The patient will be discharged to home with a prescription for Doxycycline to take one twice daily for a total of three days to avoid any potential infection and he will be given a prescription for Tylenol No.3 to use if needed for cramping. She will return to the office in approximately one  weeks time. Condition on arrival to the recovery room is satisfactory. DD:  02/15/00 TD:  02/15/00 Job: [id number]

## 2011-04-25 LAB — COMPREHENSIVE METABOLIC PANEL
ALT: 13
AST: 15
Albumin: 3.5
Alkaline Phosphatase: 30 — ABNORMAL LOW
BUN: 10
CO2: 25
Calcium: 8.9
Chloride: 106
Creatinine, Ser: 0.78
GFR calc Af Amer: >60
GFR calc non Af Amer: >60
Glucose, Bld: 102 — ABNORMAL HIGH
Potassium: 3.2 — ABNORMAL LOW
Sodium: 138
Total Bilirubin: 0.6
Total Protein: 6.6

## 2011-04-25 LAB — DIFFERENTIAL
Basophils Absolute: 0
Basophils Relative: 0
Eosinophils Absolute: 0
Eosinophils Relative: 1
Lymphocytes Relative: 39
Lymphs Abs: 2.2
Monocytes Absolute: 0.3
Monocytes Relative: 6
Neutro Abs: 2.9
Neutrophils Relative %: 54

## 2011-04-25 LAB — CBC
HCT: 34.2 — ABNORMAL LOW
Hemoglobin: 11.3 — ABNORMAL LOW
MCHC: 33.1
MCV: 77.4 — ABNORMAL LOW
Platelets: 352
RBC: 4.42
RDW: 14.6
WBC: 5.5

## 2011-04-25 LAB — URINALYSIS, ROUTINE W REFLEX MICROSCOPIC
Bilirubin Urine: NEGATIVE
Glucose, UA: NEGATIVE
Hgb urine dipstick: NEGATIVE
Nitrite: NEGATIVE
Specific Gravity, Urine: 1.029
pH: 6.5

## 2011-04-25 LAB — LIPASE, BLOOD
Lipase: 39
Lipase: 49

## 2011-04-25 LAB — AMYLASE: Amylase: 158 — ABNORMAL HIGH

## 2011-05-06 LAB — DIFFERENTIAL
Basophils Absolute: 0
Basophils Relative: 0
Eosinophils Absolute: 0
Eosinophils Absolute: 0
Eosinophils Relative: 1
Lymphocytes Relative: 35
Monocytes Absolute: 0.2
Monocytes Absolute: 0.4
Monocytes Relative: 5

## 2011-05-06 LAB — URINALYSIS, ROUTINE W REFLEX MICROSCOPIC
Bilirubin Urine: NEGATIVE
Bilirubin Urine: NEGATIVE
Glucose, UA: NEGATIVE
Hgb urine dipstick: NEGATIVE
Hgb urine dipstick: NEGATIVE
Ketones, ur: NEGATIVE
Protein, ur: NEGATIVE
Specific Gravity, Urine: 1.018
Urobilinogen, UA: 0.2
pH: 5.5

## 2011-05-06 LAB — LIPASE, BLOOD: Lipase: 43

## 2011-05-06 LAB — POCT I-STAT, CHEM 8
Hemoglobin: 15
Sodium: 138
TCO2: 21

## 2011-05-06 LAB — HEPATIC FUNCTION PANEL
Bilirubin, Direct: 0.1
Indirect Bilirubin: 0.8
Total Bilirubin: 0.9

## 2011-05-06 LAB — CBC
HCT: 39.6
Hemoglobin: 12.9
Hemoglobin: 13
MCHC: 32.2
MCHC: 32.7
MCV: 77.5 — ABNORMAL LOW
MCV: 78.3
RBC: 5.12 — ABNORMAL HIGH
RDW: 14.3

## 2011-05-06 LAB — COMPREHENSIVE METABOLIC PANEL
ALT: 12
AST: 19
Albumin: 4.3
CO2: 27
Creatinine, Ser: 0.67
Glucose, Bld: 87
Total Bilirubin: 0.7

## 2012-03-26 ENCOUNTER — Encounter (HOSPITAL_COMMUNITY): Payer: Self-pay

## 2012-03-26 ENCOUNTER — Emergency Department (HOSPITAL_COMMUNITY)
Admission: EM | Admit: 2012-03-26 | Discharge: 2012-03-26 | Disposition: A | Discharge status: Home / Self Care | Payer: Medicare Other | Attending: Emergency Medicine | Admitting: Emergency Medicine

## 2012-03-26 DIAGNOSIS — Z79899 Other long term (current) drug therapy: Secondary | ICD-10-CM | POA: Insufficient documentation

## 2012-03-26 DIAGNOSIS — K219 Gastro-esophageal reflux disease without esophagitis: Secondary | ICD-10-CM | POA: Insufficient documentation

## 2012-03-26 DIAGNOSIS — M7989 Other specified soft tissue disorders: Secondary | ICD-10-CM

## 2012-03-26 HISTORY — DX: Fibromyalgia: M79.7

## 2012-03-26 HISTORY — DX: Gastro-esophageal reflux disease without esophagitis: K21.9

## 2012-03-26 MED ORDER — ENOXAPARIN SODIUM 100 MG/ML ~~LOC~~ SOLN
1.5000 mg/kg | SUBCUTANEOUS | Status: AC
  Administered 2012-03-26: 80 mg via SUBCUTANEOUS
  Filled 2012-03-26: qty 1

## 2012-03-26 NOTE — ED Notes (Signed)
Pt now to window c/o chest pain, pt taken to Triage 2

## 2012-03-26 NOTE — ED Notes (Signed)
MD at bedside. 

## 2012-03-26 NOTE — ED Provider Notes (Signed)
History     CSN: [id number]  Arrival date & time 03/26/12  1639   First MD Initiated Contact with Patient 03/26/12 1928      Chief Complaint  Patient presents with  . Leg Swelling     The history is provided by the patient.   the patient reports developing bilateral leg swelling left greater than right over the past several days.  She has no prior history of DVT or pulmonary embolism.  Her main complaint is pain in the left calf.  She's had no recent surgery or long travel.  She's had no recent immobilization.  She reports that she had some chest pain shortness of breath several days ago but does not have any at this time.  He has no pleuritic chest pain.  She's had no lightheadedness when standing up.  She denies erythema rash.  Pain is worsened by palpation.  She denies weakness or numbness of her lower extremity  Past Medical History  Diagnosis Date  . Fibromyalgia   . Acid reflux     Past Surgical History  Procedure Date  . Carpal tunnel release   . Shoulder surgery   . Cystectomy     Family History  Problem Relation Age of Onset  . Hypertension Mother     History  Substance Use Topics  . Smoking status: Never Smoker   . Smokeless tobacco: Never Used  . Alcohol Use: No    OB History    Grav Para Term Preterm Abortions TAB SAB Ect Mult Living                  Review of Systems  All other systems reviewed and are negative.    Allergies  Review of patient's allergies indicates no known allergies.  Home Medications   Current Outpatient Rx  Name Route Sig Dispense Refill  . BIOTIN 10 MG PO CAPS Oral Take 1 tablet by mouth daily.    . CYCLOBENZAPRINE HCL 10 MG PO TABS Oral Take 10 mg by mouth 3 (three) times daily as needed. Muscle spasms    . DICLOFENAC SODIUM 75 MG PO TBEC Oral Take 75 mg by mouth 2 (two) times daily.    Marland Kitchen ESOMEPRAZOLE MAGNESIUM 40 MG PO CPDR Oral Take 40 mg by mouth daily before breakfast.    . HYDROCODONE-ACETAMINOPHEN 5-325 MG PO  TABS Oral Take 1 tablet by mouth every 6 (six) hours as needed. pain    . PREGABALIN 75 MG PO CAPS Oral Take 75 mg by mouth 2 (two) times daily.    Marland Kitchen ZONISAMIDE 25 MG PO CAPS Oral Take 75 mg by mouth daily.      BP 116/67  Pulse 60  Temp 98.4 F (36.9 C) (Oral)  Resp 18  Ht 5\' 2"  (1.575 m)  Wt 121 lb (54.885 kg)  BMI 22.13 kg/m2  SpO2 100%  LMP 03/11/2012  Physical Exam  Nursing note and vitals reviewed. Constitutional: She is oriented to person, place, and time. She appears well-developed and well-nourished. No distress.  HENT:  Head: Normocephalic and atraumatic.  Eyes: EOM are normal.  Neck: Normal range of motion.  Cardiovascular: Normal rate, regular rhythm and normal heart sounds.   Pulmonary/Chest: Effort normal and breath sounds normal.  Abdominal: Soft. She exhibits no distension. There is no tenderness.  Musculoskeletal: Normal range of motion.  Neurological: She is alert and oriented to person, place, and time.  Skin: Skin is warm and dry.  Psychiatric: She has a normal  mood and affect. Judgment normal.    ED Course  Procedures (including critical care time)   Date: 03/27/2012  Rate: 60  Rhythm: normal sinus rhythm  QRS Axis: normal  Intervals: normal  ST/T Wave abnormalities: normal  Conduction Disutrbances: none  Narrative Interpretation:   Old EKG Reviewed: No significant changes noted     Labs Reviewed - No data to display No results found.   1. Swelling of left lower extremity       MDM  Patient is given a dose of Lovenox in emergency department.  She will be seen in the vascular lab in the morning for duplex ultrasound of her left lower extremity to evaluate for DVT.  Her vitals signs are normal.  She has no hypoxia.  She has no chest pain or shortness of breath at this time.  He is noted in the nursing report that she has had shortness of breath x3 days but she does not express this to me.        Lyanne Co, MD 03/28/12 [phone]

## 2012-03-26 NOTE — ED Notes (Signed)
Patient reports that she began having bilateral leg swelling, weakness, and sob x 3 days.

## 2012-03-27 ENCOUNTER — Ambulatory Visit (HOSPITAL_COMMUNITY)
Admission: RE | Admit: 2012-03-27 | Discharge: 2012-03-27 | Disposition: A | Discharge status: Home / Self Care | Payer: Medicare Other | Source: Ambulatory Visit | Attending: Emergency Medicine | Admitting: Emergency Medicine

## 2012-03-27 DIAGNOSIS — M7989 Other specified soft tissue disorders: Secondary | ICD-10-CM

## 2012-03-27 NOTE — Progress Notes (Signed)
VASCULAR LAB PRELIMINARY  PRELIMINARY  PRELIMINARY  PRELIMINARY  Left lower extremity venous Doppler completed.    Preliminary report:  There is no DVT or SVT noted in the left lower extremity.  Kerry Ortiz, 03/27/2012, 7:19 PM

## 2012-04-21 ENCOUNTER — Other Ambulatory Visit: Payer: Self-pay | Admitting: Cardiology

## 2012-04-21 DIAGNOSIS — R079 Chest pain, unspecified: Secondary | ICD-10-CM

## 2012-04-27 ENCOUNTER — Ambulatory Visit
Admission: RE | Admit: 2012-04-27 | Discharge: 2012-04-27 | Disposition: A | Discharge status: Home / Self Care | Payer: Medicare Other | Source: Ambulatory Visit | Attending: Cardiology | Admitting: Cardiology

## 2012-04-27 DIAGNOSIS — R079 Chest pain, unspecified: Secondary | ICD-10-CM

## 2012-04-27 MED ORDER — IOHEXOL 350 MG/ML SOLN
100.0000 mL | Freq: Once | INTRAVENOUS | Status: AC | PRN
  Administered 2012-04-27: 100 mL via INTRAVENOUS

## 2012-07-11 ENCOUNTER — Emergency Department (HOSPITAL_COMMUNITY)
Admission: EM | Admit: 2012-07-11 | Discharge: 2012-07-11 | Disposition: A | Discharge status: Home / Self Care | Payer: Medicare Other | Attending: Emergency Medicine | Admitting: Emergency Medicine

## 2012-07-11 ENCOUNTER — Encounter (HOSPITAL_COMMUNITY): Payer: Self-pay | Admitting: *Deleted

## 2012-07-11 DIAGNOSIS — R109 Unspecified abdominal pain: Secondary | ICD-10-CM | POA: Insufficient documentation

## 2012-07-11 DIAGNOSIS — Z8739 Personal history of other diseases of the musculoskeletal system and connective tissue: Secondary | ICD-10-CM | POA: Insufficient documentation

## 2012-07-11 DIAGNOSIS — K2970 Gastritis, unspecified, without bleeding: Secondary | ICD-10-CM | POA: Insufficient documentation

## 2012-07-11 DIAGNOSIS — K219 Gastro-esophageal reflux disease without esophagitis: Secondary | ICD-10-CM | POA: Insufficient documentation

## 2012-07-11 DIAGNOSIS — R1900 Intra-abdominal and pelvic swelling, mass and lump, unspecified site: Secondary | ICD-10-CM | POA: Insufficient documentation

## 2012-07-11 DIAGNOSIS — R197 Diarrhea, unspecified: Secondary | ICD-10-CM | POA: Insufficient documentation

## 2012-07-11 DIAGNOSIS — Z79899 Other long term (current) drug therapy: Secondary | ICD-10-CM | POA: Insufficient documentation

## 2012-07-11 DIAGNOSIS — K2990 Gastroduodenitis, unspecified, without bleeding: Secondary | ICD-10-CM | POA: Insufficient documentation

## 2012-07-11 LAB — COMPREHENSIVE METABOLIC PANEL
ALT: 17 U/L (ref 0–35)
AST: 16 U/L (ref 0–37)
Albumin: 3.8 g/dL (ref 3.5–5.2)
Calcium: 8.9 mg/dL (ref 8.4–10.5)
Creatinine, Ser: 0.76 mg/dL (ref 0.50–1.10)
GFR calc non Af Amer: >90 mL/min (ref >90)
Sodium: 136 mEq/L (ref 135–145)
Total Protein: 7.6 g/dL (ref 6.0–8.3)

## 2012-07-11 LAB — CBC WITH DIFFERENTIAL/PLATELET
Basophils Absolute: 0 10*3/uL (ref 0.0–0.1)
Basophils Relative: 0 % (ref 0–1)
Eosinophils Relative: 1 % (ref 0–5)
HCT: 37.4 % (ref 36.0–46.0)
Lymphocytes Relative: 35 % (ref 12–46)
MCHC: 33.4 g/dL (ref 30.0–36.0)
MCV: 75.3 fL — ABNORMAL LOW (ref 78.0–100.0)
Monocytes Absolute: 0.2 10*3/uL (ref 0.1–1.0)
Platelets: 346 10*3/uL (ref 150–400)
RDW: 13.9 % (ref 11.5–15.5)
WBC: 5.1 10*3/uL (ref 4.0–10.5)

## 2012-07-11 LAB — URINALYSIS, ROUTINE W REFLEX MICROSCOPIC
Glucose, UA: NEGATIVE mg/dL
Hgb urine dipstick: NEGATIVE
Leukocytes, UA: NEGATIVE
Protein, ur: NEGATIVE mg/dL
Specific Gravity, Urine: 1.03 (ref 1.005–1.030)
pH: 5.5 (ref 5.0–8.0)

## 2012-07-11 LAB — PREGNANCY, URINE: Preg Test, Ur: NEGATIVE

## 2012-07-11 MED ORDER — ONDANSETRON HCL 4 MG PO TABS: 4.0000 mg | ORAL_TABLET | Freq: Four times a day (QID) | ORAL | Status: DC

## 2012-07-11 MED ORDER — SODIUM CHLORIDE 0.9 % IV BOLUS (SEPSIS)
1000.0000 mL | Freq: Once | INTRAVENOUS | Status: AC
  Administered 2012-07-11: 1000 mL via INTRAVENOUS

## 2012-07-11 MED ORDER — GI COCKTAIL ~~LOC~~
30.0000 mL | Freq: Once | ORAL | Status: AC
  Administered 2012-07-11: 30 mL via ORAL
  Filled 2012-07-11: qty 30

## 2012-07-11 MED ORDER — MORPHINE SULFATE 4 MG/ML IJ SOLN
4.0000 mg | Freq: Once | INTRAMUSCULAR | Status: AC
  Administered 2012-07-11: 4 mg via INTRAVENOUS
  Filled 2012-07-11: qty 1

## 2012-07-11 MED ORDER — ONDANSETRON HCL 4 MG/2ML IJ SOLN
4.0000 mg | Freq: Once | INTRAMUSCULAR | Status: AC
  Administered 2012-07-11: 4 mg via INTRAVENOUS
  Filled 2012-07-11: qty 2

## 2012-07-11 MED ORDER — SUCRALFATE 1 G PO TABS: 1.0000 g | ORAL_TABLET | Freq: Four times a day (QID) | ORAL | Status: DC

## 2012-07-11 NOTE — ED Provider Notes (Signed)
History     CSN: [id number]  Arrival date & time 07/11/12  0136   First MD Initiated Contact with Patient 07/11/12 0205      Chief Complaint  Patient presents with  . Nausea  . Emesis  . Diarrhea    (Consider location/radiation/quality/duration/timing/severity/associated sxs/prior treatment) HPI Comments: Patient presents with a one-day history of nausea vomiting and diarrhea. She states she started with some diarrhea this morning around 7 AM. She describes as watery diarrhea. She subsequently began having nausea and vomiting this evening. She's also had of constant burning type pain to her upper abdomen. She has had a history of pancreatitis and peptic ulcer disease in the past. She denies any blood in her stool or her emesis. Denies any fevers or chills. Denies any urinary symptoms. She's taking antacids without relief.  Patient is a 46 y.o. female presenting with vomiting and diarrhea.  Emesis  Associated symptoms include abdominal pain and diarrhea. Pertinent negatives include no arthralgias, no chills, no cough, no fever and no headaches.  Diarrhea The primary symptoms include abdominal pain, nausea, vomiting and diarrhea. Primary symptoms do not include fever, fatigue, arthralgias or rash.  The illness does not include chills or back pain.    Past Medical History  Diagnosis Date  . Fibromyalgia   . Acid reflux     Past Surgical History  Procedure Date  . Carpal tunnel release   . Shoulder surgery   . Cystectomy     Family History  Problem Relation Age of Onset  . Hypertension Mother     History  Substance Use Topics  . Smoking status: Never Smoker   . Smokeless tobacco: Never Used  . Alcohol Use: No    OB History    Grav Para Term Preterm Abortions TAB SAB Ect Mult Living                  Review of Systems  Constitutional: Negative for fever, chills, diaphoresis and fatigue.  HENT: Negative for congestion, rhinorrhea and sneezing.   Eyes: Negative.    Respiratory: Negative for cough, chest tightness and shortness of breath.   Cardiovascular: Negative for chest pain and leg swelling.  Gastrointestinal: Positive for nausea, vomiting, abdominal pain, diarrhea and abdominal distention. Negative for blood in stool.  Genitourinary: Negative for frequency, hematuria, flank pain and difficulty urinating.  Musculoskeletal: Negative for back pain and arthralgias.  Skin: Negative for rash.  Neurological: Negative for dizziness, speech difficulty, weakness, numbness and headaches.    Allergies  Review of patient's allergies indicates no known allergies.  Home Medications   Current Outpatient Rx  Name  Route  Sig  Dispense  Refill  . BIOTIN 10 MG PO CAPS   Oral   Take 1 tablet by mouth daily.         . CYCLOBENZAPRINE HCL 10 MG PO TABS   Oral   Take 10 mg by mouth 3 (three) times daily as needed. Muscle spasms         . DICLOFENAC SODIUM 75 MG PO TBEC   Oral   Take 75 mg by mouth 2 (two) times daily.         Marland Kitchen ESOMEPRAZOLE MAGNESIUM 40 MG PO CPDR   Oral   Take 40 mg by mouth daily before breakfast.         . HYDROCODONE-ACETAMINOPHEN 5-325 MG PO TABS   Oral   Take 1 tablet by mouth every 6 (six) hours as needed. pain         .  PREGABALIN 75 MG PO CAPS   Oral   Take 75 mg by mouth 2 (two) times daily.         Marland Kitchen ZONISAMIDE 25 MG PO CAPS   Oral   Take 75 mg by mouth daily.         Marland Kitchen ONDANSETRON HCL 4 MG PO TABS   Oral   Take 1 tablet (4 mg total) by mouth every 6 (six) hours.   12 tablet   0   . SUCRALFATE 1 G PO TABS   Oral   Take 1 tablet (1 g total) by mouth 4 (four) times daily.   20 tablet   0     BP 109/76  Pulse 85  Temp 99.1 F (37.3 C) (Oral)  Resp 15  Ht 5\' 2"  (1.575 m)  Wt 121 lb (54.885 kg)  BMI 22.13 kg/m2  SpO2 100%  LMP 06/23/2012  Physical Exam  Constitutional: She is oriented to person, place, and time. She appears well-developed and well-nourished.  HENT:  Head:  Normocephalic and atraumatic.  Eyes: Pupils are equal, round, and reactive to light.  Neck: Normal range of motion. Neck supple.  Cardiovascular: Normal rate, regular rhythm and normal heart sounds.   Pulmonary/Chest: Effort normal and breath sounds normal. No respiratory distress. She has no wheezes. She has no rales. She exhibits no tenderness.  Abdominal: Soft. Bowel sounds are normal. There is tenderness (Moderate tenderness to epigastrium). There is no rebound and no guarding.       No CVA tenderness  Musculoskeletal: Normal range of motion. She exhibits no edema.  Lymphadenopathy:    She has no cervical adenopathy.  Neurological: She is alert and oriented to person, place, and time.  Skin: Skin is warm and dry. No rash noted.  Psychiatric: She has a normal mood and affect.    ED Course  Procedures (including critical care time)  Results for orders placed during the hospital encounter of 07/11/12  CBC WITH DIFFERENTIAL      Component Value Range   WBC 5.1  4.0 - 10.5 K/uL   RBC 4.97  3.87 - 5.11 MIL/uL   Hemoglobin 12.5  12.0 - 15.0 g/dL   HCT 40.9  81.1 - 91.4 %   MCV 75.3 (*) 78.0 - 100.0 fL   MCH 25.2 (*) 26.0 - 34.0 pg   MCHC 33.4  30.0 - 36.0 g/dL   RDW 78.2  95.6 - 21.3 %   Platelets 346  150 - 400 K/uL   Neutrophils Relative 61  43 - 77 %   Neutro Abs 3.1  1.7 - 7.7 K/uL   Lymphocytes Relative 35  12 - 46 %   Lymphs Abs 1.8  0.7 - 4.0 K/uL   Monocytes Relative 4  3 - 12 %   Monocytes Absolute 0.2  0.1 - 1.0 K/uL   Eosinophils Relative 1  0 - 5 %   Eosinophils Absolute 0.0  0.0 - 0.7 K/uL   Basophils Relative 0  0 - 1 %   Basophils Absolute 0.0  0.0 - 0.1 K/uL  COMPREHENSIVE METABOLIC PANEL      Component Value Range   Sodium 136  135 - 145 mEq/L   Potassium 3.5  3.5 - 5.1 mEq/L   Chloride 104  96 - 112 mEq/L   CO2 22  19 - 32 mEq/L   Glucose, Bld 103 (*) 70 - 99 mg/dL   BUN 10  6 - 23 mg/dL   Creatinine,  Ser 0.76  0.50 - 1.10 mg/dL   Calcium 8.9  8.4 -  96.0 mg/dL   Total Protein 7.6  6.0 - 8.3 g/dL   Albumin 3.8  3.5 - 5.2 g/dL   AST 16  0 - 37 U/L   ALT 17  0 - 35 U/L   Alkaline Phosphatase 35 (*) 39 - 117 U/L   Total Bilirubin 0.3  0.3 - 1.2 mg/dL   GFR calc non Af Amer >90  >90 mL/min   GFR calc Af Amer >90  >90 mL/min  LIPASE, BLOOD      Component Value Range   Lipase 38  11 - 59 U/L  URINALYSIS, ROUTINE W REFLEX MICROSCOPIC      Component Value Range   Color, Urine YELLOW  YELLOW   APPearance CLOUDY (*) CLEAR   Specific Gravity, Urine 1.030  1.005 - 1.030   pH 5.5  5.0 - 8.0   Glucose, UA NEGATIVE  NEGATIVE mg/dL   Hgb urine dipstick NEGATIVE  NEGATIVE   Bilirubin Urine NEGATIVE  NEGATIVE   Ketones, ur NEGATIVE  NEGATIVE mg/dL   Protein, ur NEGATIVE  NEGATIVE mg/dL   Urobilinogen, UA 1.0  0.0 - 1.0 mg/dL   Nitrite NEGATIVE  NEGATIVE   Leukocytes, UA NEGATIVE  NEGATIVE  PREGNANCY, URINE      Component Value Range   Preg Test, Ur NEGATIVE  NEGATIVE   No results found.    1. Gastritis       MDM  Pt feeling much better after fluids, zofran, GI cocktail.  abd nontender.  No evidence of pancreatitis.  Given that pain has resolved, do not feel that further imaging is warranted.  Will start on zofran, carafate in addition to her nexium.  F/u with her PMD in 2 days or return here if symptoms worsen.        Rolan Bucco, MD 07/11/12 [phone]

## 2012-07-11 NOTE — ED Notes (Signed)
Pt from home, alert and oriented. Sts she began having diarrhea constantly since Saturday morning at 700am and nausea/vomiting since 11pm Saturday as well. Patient sts that she has had a lot of belching and abdominal pain constantly since Saturday morning. Patient sts she has taken an antacid with no relief.

## 2012-09-23 ENCOUNTER — Other Ambulatory Visit: Payer: Self-pay | Admitting: Gastroenterology

## 2012-09-23 DIAGNOSIS — R109 Unspecified abdominal pain: Secondary | ICD-10-CM

## 2012-09-23 DIAGNOSIS — R102 Pelvic and perineal pain: Secondary | ICD-10-CM

## 2012-09-28 ENCOUNTER — Other Ambulatory Visit: Payer: Medicare Other

## 2012-10-04 ENCOUNTER — Other Ambulatory Visit: Payer: Medicare Other

## 2012-10-08 ENCOUNTER — Other Ambulatory Visit: Payer: Medicare Other

## 2012-10-20 ENCOUNTER — Ambulatory Visit
Admission: RE | Admit: 2012-10-20 | Discharge: 2012-10-20 | Disposition: A | Discharge status: Home / Self Care | Payer: Medicare Other | Source: Ambulatory Visit | Attending: Gastroenterology | Admitting: Gastroenterology

## 2012-10-20 DIAGNOSIS — R109 Unspecified abdominal pain: Secondary | ICD-10-CM

## 2012-10-20 DIAGNOSIS — R102 Pelvic and perineal pain: Secondary | ICD-10-CM

## 2012-11-11 ENCOUNTER — Other Ambulatory Visit (HOSPITAL_COMMUNITY)
Admission: RE | Admit: 2012-11-11 | Discharge: 2012-11-11 | Disposition: A | Discharge status: Home / Self Care | Payer: Medicare Other | Source: Ambulatory Visit | Attending: Obstetrics and Gynecology | Admitting: Obstetrics and Gynecology

## 2012-11-11 ENCOUNTER — Other Ambulatory Visit: Payer: Self-pay | Admitting: Obstetrics and Gynecology

## 2012-11-11 DIAGNOSIS — Z124 Encounter for screening for malignant neoplasm of cervix: Secondary | ICD-10-CM | POA: Insufficient documentation

## 2012-11-11 DIAGNOSIS — Z1151 Encounter for screening for human papillomavirus (HPV): Secondary | ICD-10-CM | POA: Insufficient documentation

## 2013-04-11 ENCOUNTER — Encounter (HOSPITAL_COMMUNITY): Payer: Self-pay | Admitting: Emergency Medicine

## 2013-04-11 ENCOUNTER — Emergency Department (INDEPENDENT_AMBULATORY_CARE_PROVIDER_SITE_OTHER)
Admission: EM | Admit: 2013-04-11 | Discharge: 2013-04-11 | Disposition: A | Discharge status: Home / Self Care | Payer: Medicare Other | Source: Home / Self Care | Attending: Family Medicine | Admitting: Family Medicine

## 2013-04-11 DIAGNOSIS — L501 Idiopathic urticaria: Secondary | ICD-10-CM

## 2013-04-11 MED ORDER — TRIAMCINOLONE ACETONIDE 40 MG/ML IJ SUSP
INTRAMUSCULAR | Status: AC
  Filled 2013-04-11: qty 1

## 2013-04-11 MED ORDER — TRIAMCINOLONE ACETONIDE 40 MG/ML IJ SUSP
40.0000 mg | Freq: Once | INTRAMUSCULAR | Status: AC
  Administered 2013-04-11: 40 mg via INTRAMUSCULAR

## 2013-04-11 MED ORDER — HYDROXYZINE HCL 25 MG PO TABS: 25.0000 mg | ORAL_TABLET | Freq: Four times a day (QID) | ORAL | Status: DC

## 2013-04-11 NOTE — ED Notes (Signed)
C/o rash on bilateral arms for one week now.  Patient has used cream on arm but no relief.  Patient says she notice rash after doing yard work.  Rash does itch.

## 2013-04-11 NOTE — ED Provider Notes (Signed)
CSN: [id number]     Arrival date & time 04/11/13  1257 History   First MD Initiated Contact with Patient 04/11/13 1409     Chief Complaint  Patient presents with  . Rash   (Consider location/radiation/quality/duration/timing/severity/associated sxs/prior Treatment) Patient is a 47 y.o. female presenting with rash. The history is provided by the patient.  Rash Pain severity:  No pain Onset quality:  Sudden Duration:  1 week Progression:  Unchanged Chronicity:  New Context comment:  Onset after working in yard, not evident at this time.   Past Medical History  Diagnosis Date  . Fibromyalgia   . Acid reflux    Past Surgical History  Procedure Laterality Date  . Carpal tunnel release    . Shoulder surgery    . Cystectomy     Family History  Problem Relation Age of Onset  . Hypertension Mother    History  Substance Use Topics  . Smoking status: Never Smoker   . Smokeless tobacco: Never Used  . Alcohol Use: No   OB History   Grav Para Term Preterm Abortions TAB SAB Ect Mult Living                 Review of Systems  Constitutional: Negative.   Skin: Positive for rash.    Allergies  Review of patient's allergies indicates no known allergies.  Home Medications   Current Outpatient Rx  Name  Route  Sig  Dispense  Refill  . Biotin 10 MG CAPS   Oral   Take 1 tablet by mouth daily.         . cyclobenzaprine (FLEXERIL) 10 MG tablet   Oral   Take 10 mg by mouth 3 (three) times daily as needed. Muscle spasms         . diclofenac (VOLTAREN) 75 MG EC tablet   Oral   Take 75 mg by mouth 2 (two) times daily.         Marland Kitchen esomeprazole (NEXIUM) 40 MG capsule   Oral   Take 40 mg by mouth daily before breakfast.         . HYDROcodone-acetaminophen (NORCO/VICODIN) 5-325 MG per tablet   Oral   Take 1 tablet by mouth every 6 (six) hours as needed. pain         . hydrOXYzine (ATARAX/VISTARIL) 25 MG tablet   Oral   Take 1 tablet (25 mg total) by mouth every 6  (six) hours. For rash   20 tablet   0   . ondansetron (ZOFRAN) 4 MG tablet   Oral   Take 1 tablet (4 mg total) by mouth every 6 (six) hours.   12 tablet   0   . pregabalin (LYRICA) 75 MG capsule   Oral   Take 75 mg by mouth 2 (two) times daily.         . sucralfate (CARAFATE) 1 G tablet   Oral   Take 1 tablet (1 g total) by mouth 4 (four) times daily.   20 tablet   0   . zonisamide (ZONEGRAN) 25 MG capsule   Oral   Take 75 mg by mouth daily.          BP 117/74  Pulse 62  Temp(Src) 98.8 F (37.1 C) (Oral)  Resp 18  SpO2 100%  LMP 02/14/2013 Physical Exam  Nursing note and vitals reviewed. Constitutional: She is oriented to person, place, and time. She appears well-developed and well-nourished.  Neurological: She is alert and oriented  to person, place, and time.  Skin: Skin is warm and dry. No rash noted.    ED Course  Procedures (including critical care time) Labs Review Labs Reviewed - No data to display Imaging Review No results found.  MDM      Linna Hoff, MD 04/11/13 1430

## 2013-09-02 ENCOUNTER — Emergency Department (INDEPENDENT_AMBULATORY_CARE_PROVIDER_SITE_OTHER)
Admission: EM | Admit: 2013-09-02 | Discharge: 2013-09-02 | Disposition: A | Discharge status: Home / Self Care | Payer: Medicare Other | Source: Home / Self Care | Attending: Family Medicine | Admitting: Family Medicine

## 2013-09-02 ENCOUNTER — Encounter (HOSPITAL_COMMUNITY): Payer: Self-pay | Admitting: Emergency Medicine

## 2013-09-02 DIAGNOSIS — M79641 Pain in right hand: Secondary | ICD-10-CM

## 2013-09-02 DIAGNOSIS — M79609 Pain in unspecified limb: Secondary | ICD-10-CM

## 2013-09-02 DIAGNOSIS — B354 Tinea corporis: Secondary | ICD-10-CM

## 2013-09-02 MED ORDER — NAPROXEN 500 MG PO TABS
500.0000 mg | ORAL_TABLET | Freq: Two times a day (BID) | ORAL | Status: DC
Start: 2013-09-02 — End: 2013-11-16

## 2013-09-02 MED ORDER — TERBINAFINE HCL 1 % EX CREA: 1.0000 "application " | TOPICAL_CREAM | Freq: Two times a day (BID) | CUTANEOUS | Status: DC

## 2013-09-02 NOTE — ED Provider Notes (Signed)
CSN: [id number]     Arrival date & time 09/02/13  1057 History   First MD Initiated Contact with Patient 09/02/13 1141     Chief Complaint  Patient presents with  . Rash  . Hand Problem   (Consider location/radiation/quality/duration/timing/severity/associated sxs/prior Treatment) Patient is a 48 y.o. female presenting with rash.  Rash Location:  Hand Hand rash location:  R wrist and R finger Quality: dryness, itchiness and scaling   Severity:  Mild Onset quality:  Gradual Duration:  4 days Timing:  Constant Progression:  Worsening Chronicity:  New Relieved by:  None tried Worsened by:  Nothing tried Ineffective treatments:  None tried  Kerry Ortiz is a 48 y.o. female who presents to the ED with a rash on the right wrist and little finger that started 4 days ago. She complains of itching to the areas. She has not used any OTC medications. She woke this morning and noted swelling of the right hand and tenderness. She does not know of any injury to the hand.   Past Medical History  Diagnosis Date  . Fibromyalgia   . Acid reflux    Past Surgical History  Procedure Laterality Date  . Carpal tunnel release    . Shoulder surgery    . Cystectomy     Family History  Problem Relation Age of Onset  . Hypertension Mother    History  Substance Use Topics  . Smoking status: Never Smoker   . Smokeless tobacco: Never Used  . Alcohol Use: No   OB History   Grav Para Term Preterm Abortions TAB SAB Ect Mult Living                 Review of Systems Negative except as stated in HPI  Allergies  Review of patient's allergies indicates no known allergies.  Home Medications   Current Outpatient Rx  Name  Route  Sig  Dispense  Refill  . diclofenac (VOLTAREN) 75 MG EC tablet   Oral   Take 75 mg by mouth 2 (two) times daily.         Marland Kitchen esomeprazole (NEXIUM) 40 MG capsule   Oral   Take 40 mg by mouth daily before breakfast.         . HYDROcodone-acetaminophen  (NORCO/VICODIN) 5-325 MG per tablet   Oral   Take 1 tablet by mouth every 6 (six) hours as needed. pain         . pregabalin (LYRICA) 75 MG capsule   Oral   Take 75 mg by mouth 2 (two) times daily.         Marland Kitchen tiZANidine (ZANAFLEX) 4 MG capsule   Oral   Take 4 mg by mouth 3 (three) times daily.         Marland Kitchen zonisamide (ZONEGRAN) 25 MG capsule   Oral   Take 75 mg by mouth daily.         . Biotin 10 MG CAPS   Oral   Take 1 tablet by mouth daily.         . cyclobenzaprine (FLEXERIL) 10 MG tablet   Oral   Take 10 mg by mouth 3 (three) times daily as needed. Muscle spasms         . hydrOXYzine (ATARAX/VISTARIL) 25 MG tablet   Oral   Take 1 tablet (25 mg total) by mouth every 6 (six) hours. For rash   20 tablet   0   . ondansetron (ZOFRAN) 4 MG tablet  Oral   Take 1 tablet (4 mg total) by mouth every 6 (six) hours.   12 tablet   0   . sucralfate (CARAFATE) 1 G tablet   Oral   Take 1 tablet (1 g total) by mouth 4 (four) times daily.   20 tablet   0    BP 136/62  Pulse 60  Temp(Src) 97.7 F (36.5 C) (Oral)  Resp 16  SpO2 100%  LMP 07/05/2013 Physical Exam  Nursing note and vitals reviewed. Constitutional: She is oriented to person, place, and time. She appears well-developed and well-nourished. No distress.  HENT:  Head: Normocephalic.  Eyes: EOM are normal.  Neck: Neck supple.  Cardiovascular: Normal rate.   Pulmonary/Chest: Effort normal.  Musculoskeletal: Normal range of motion.       Right hand: She exhibits tenderness and swelling. She exhibits normal range of motion, normal capillary refill and no deformity. Normal sensation noted. Normal strength noted.       Hands: There is a circular scaly rash noted on the little finger of the right hand. There is a similar area noted to the ulnar aspect of the right wrist. Radial pulse strong, adequate circulation, good touch sensation. There is minimal swelling noted to the dorsum of the right hand that is  tender with palpation.    Neurological: She is alert and oriented to person, place, and time. No cranial nerve deficit.  Skin: Skin is warm and dry.  Psychiatric: She has a normal mood and affect. Her behavior is normal.    ED Course  Procedures   MDM  48 y.o. female with tinea of the wrist and right little finger. Pain of the right hand and wrist due to inflammation. No signs of infection at this time.  She will wear a wrist splint for comfort, take NSAIDS and use the antifungal  cream for the ringworm. She will return as needed for any problems.    Medication List    TAKE these medications       naproxen 500 MG tablet  Commonly known as:  NAPROSYN  Take 1 tablet (500 mg total) by mouth 2 (two) times daily.     terbinafine 1 % cream  Commonly known as:  LAMISIL AT  Apply 1 application topically 2 (two) times daily.      ASK your doctor about these medications       Biotin 10 MG Caps  Take 1 tablet by mouth daily.     cyclobenzaprine 10 MG tablet  Commonly known as:  FLEXERIL  Take 10 mg by mouth 3 (three) times daily as needed. Muscle spasms     diclofenac 75 MG EC tablet  Commonly known as:  VOLTAREN  Take 75 mg by mouth 2 (two) times daily.     esomeprazole 40 MG capsule  Commonly known as:  NEXIUM  Take 40 mg by mouth daily before breakfast.     HYDROcodone-acetaminophen 5-325 MG per tablet  Commonly known as:  NORCO/VICODIN  Take 1 tablet by mouth every 6 (six) hours as needed. pain     hydrOXYzine 25 MG tablet  Commonly known as:  ATARAX/VISTARIL  Take 1 tablet (25 mg total) by mouth every 6 (six) hours. For rash     ondansetron 4 MG tablet  Commonly known as:  ZOFRAN  Take 1 tablet (4 mg total) by mouth every 6 (six) hours.     pregabalin 75 MG capsule  Commonly known as:  LYRICA  Take 75 mg  by mouth 2 (two) times daily.     sucralfate 1 G tablet  Commonly known as:  CARAFATE  Take 1 tablet (1 g total) by mouth 4 (four) times daily.     tiZANidine  4 MG capsule  Commonly known as:  ZANAFLEX  Take 4 mg by mouth 3 (three) times daily.     zonisamide 25 MG capsule  Commonly known as:  ZONEGRAN  Take 75 mg by mouth daily.           Ashley Murrain, NP 09/02/13 1225

## 2013-09-02 NOTE — Discharge Instructions (Signed)
The swelling and tenderness in your hand is most likely inflammation. We are giving you medication for pain and inflammation. Wear the wrist splint for comfort. Use the cream for the ring worm. Return as needed.

## 2013-09-02 NOTE — ED Provider Notes (Signed)
Medical screening examination/treatment/procedure(s) were performed by resident physician or non-physician practitioner and as supervising physician I was immediately available for consultation/collaboration.   Pauline Good MD.   Billy Fischer, MD 09/02/13 2137

## 2013-09-02 NOTE — ED Notes (Signed)
C/o rash and swelling of the right hand with irritation/burning sensation.  X 3 to 4 days.  No otc meds used for symptoms.  Denies changes in soaps/detergents.

## 2013-09-23 ENCOUNTER — Encounter (HOSPITAL_COMMUNITY): Payer: Self-pay | Admitting: Emergency Medicine

## 2013-09-23 ENCOUNTER — Emergency Department (INDEPENDENT_AMBULATORY_CARE_PROVIDER_SITE_OTHER)
Admission: EM | Admit: 2013-09-23 | Discharge: 2013-09-23 | Disposition: A | Discharge status: Home / Self Care | Payer: Medicare Other | Source: Home / Self Care | Attending: Family Medicine | Admitting: Family Medicine

## 2013-09-23 DIAGNOSIS — J069 Acute upper respiratory infection, unspecified: Secondary | ICD-10-CM

## 2013-09-23 MED ORDER — GUAIFENESIN-CODEINE 100-10 MG/5ML PO SOLN: 10.0000 mL | Freq: Four times a day (QID) | ORAL | Status: DC | PRN

## 2013-09-23 MED ORDER — ALBUTEROL SULFATE HFA 108 (90 BASE) MCG/ACT IN AERS: 2.0000 | INHALATION_SPRAY | RESPIRATORY_TRACT | Status: DC | PRN

## 2013-09-23 MED ORDER — AEROCHAMBER PLUS FLO-VU LARGE MISC: 1.0000 | Freq: Once | Status: DC

## 2013-09-23 MED ORDER — METHYLPREDNISOLONE (PAK) 4 MG PO TABS: ORAL_TABLET | ORAL | Status: DC

## 2013-09-23 MED ORDER — AZITHROMYCIN 250 MG PO TABS: ORAL_TABLET | ORAL | Status: DC

## 2013-09-23 NOTE — ED Notes (Signed)
Pt states symptoms present since 09/16/13.  Mw,cma

## 2013-09-23 NOTE — Discharge Instructions (Signed)

## 2013-09-23 NOTE — ED Provider Notes (Signed)
CSN: [id number]     Arrival date & time 09/23/13  1615 History   First MD Initiated Contact with Patient 09/23/13 1737     Chief Complaint  Patient presents with  . Cough     (Consider location/radiation/quality/duration/timing/severity/associated sxs/prior Treatment) HPI Comments: 67f presents complaining of cough, chest tightness, nasal congestion, shortness of breath, pleuritic chest pain. These symptoms have all been getting worse for about one week. She denies any recent travel or sick contacts. Denies any fever at home. Denies NVD  Patient is a 48 y.o. female presenting with cough.  Cough Associated symptoms: chest pain, rhinorrhea, sore throat and wheezing   Associated symptoms: no chills, no fever, no myalgias, no rash and no shortness of breath     Past Medical History  Diagnosis Date  . Fibromyalgia   . Acid reflux    Past Surgical History  Procedure Laterality Date  . Carpal tunnel release    . Shoulder surgery    . Cystectomy     Family History  Problem Relation Age of Onset  . Hypertension Mother    History  Substance Use Topics  . Smoking status: Never Smoker   . Smokeless tobacco: Never Used  . Alcohol Use: No   OB History   Grav Para Term Preterm Abortions TAB SAB Ect Mult Living                 Review of Systems  Constitutional: Negative for fever and chills.  HENT: Positive for congestion, rhinorrhea and sore throat. Negative for postnasal drip.   Eyes: Negative for visual disturbance.  Respiratory: Positive for cough, chest tightness and wheezing. Negative for shortness of breath.   Cardiovascular: Positive for chest pain. Negative for palpitations and leg swelling.  Gastrointestinal: Negative for nausea, vomiting, abdominal pain and diarrhea.  Endocrine: Negative for polydipsia and polyuria.  Genitourinary: Negative for dysuria, urgency and frequency.  Musculoskeletal: Negative for arthralgias and myalgias.  Skin: Negative for rash.   Neurological: Negative for dizziness, weakness and light-headedness.      Allergies  Review of patient's allergies indicates no known allergies.  Home Medications   Current Outpatient Rx  Name  Route  Sig  Dispense  Refill  . esomeprazole (NEXIUM) 40 MG capsule   Oral   Take 40 mg by mouth daily before breakfast.         . HYDROcodone-acetaminophen (NORCO/VICODIN) 5-325 MG per tablet   Oral   Take 1 tablet by mouth every 6 (six) hours as needed. pain         . zonisamide (ZONEGRAN) 25 MG capsule   Oral   Take 75 mg by mouth daily.         Marland Kitchen albuterol (PROVENTIL HFA;VENTOLIN HFA) 108 (90 BASE) MCG/ACT inhaler   Inhalation   Inhale 2 puffs into the lungs every 4 (four) hours as needed for wheezing.   1 Inhaler   0   . azithromycin (ZITHROMAX Z-PAK) 250 MG tablet      Use as directed   6 each   0   . Biotin 10 MG CAPS   Oral   Take 1 tablet by mouth daily.         . cyclobenzaprine (FLEXERIL) 10 MG tablet   Oral   Take 10 mg by mouth 3 (three) times daily as needed. Muscle spasms         . diclofenac (VOLTAREN) 75 MG EC tablet   Oral   Take 75 mg by mouth  2 (two) times daily.         Marland Kitchen guaiFENesin-codeine 100-10 MG/5ML syrup   Oral   Take 10 mLs by mouth every 6 (six) hours as needed for cough.   120 mL   0   . hydrOXYzine (ATARAX/VISTARIL) 25 MG tablet   Oral   Take 1 tablet (25 mg total) by mouth every 6 (six) hours. For rash   20 tablet   0   . methylPREDNIsolone (MEDROL DOSPACK) 4 MG tablet      follow package directions   21 tablet   0   . naproxen (NAPROSYN) 500 MG tablet   Oral   Take 1 tablet (500 mg total) by mouth 2 (two) times daily.   20 tablet   0   . ondansetron (ZOFRAN) 4 MG tablet   Oral   Take 1 tablet (4 mg total) by mouth every 6 (six) hours.   12 tablet   0   . pregabalin (LYRICA) 75 MG capsule   Oral   Take 75 mg by mouth 2 (two) times daily.         Marland Kitchen Spacer/Aero-Holding Chambers (AEROCHAMBER PLUS  FLO-VU LARGE) MISC   Other   1 each by Other route once.   1 each   0   . sucralfate (CARAFATE) 1 G tablet   Oral   Take 1 tablet (1 g total) by mouth 4 (four) times daily.   20 tablet   0   . terbinafine (LAMISIL AT) 1 % cream   Topical   Apply 1 application topically 2 (two) times daily.   30 g   0   . tiZANidine (ZANAFLEX) 4 MG capsule   Oral   Take 4 mg by mouth 3 (three) times daily.          BP 123/73  Pulse 70  Temp(Src) 98.7 F (37.1 C) (Oral)  Resp 18  SpO2 100%  LMP 09/16/2013 Physical Exam  Nursing note and vitals reviewed. Constitutional: She is oriented to person, place, and time. Vital signs are normal. She appears well-developed and well-nourished. No distress.  HENT:  Head: Normocephalic and atraumatic.  Right Ear: External ear normal.  Left Ear: External ear normal.  Nose: Nose normal.  Mouth/Throat: Oropharynx is clear and moist. No oropharyngeal exudate.  Eyes: Conjunctivae are normal. Right eye exhibits no discharge. Left eye exhibits no discharge.  Neck: Normal range of motion. Neck supple.  Cardiovascular: Normal rate, regular rhythm and normal heart sounds.  Exam reveals no gallop and no friction rub.   No murmur heard. Pulmonary/Chest: Effort normal and breath sounds normal. No respiratory distress. She has no wheezes. She has no rales.  Lymphadenopathy:    She has no cervical adenopathy.  Neurological: She is alert and oriented to person, place, and time. She has normal strength. Coordination normal.  Skin: Skin is warm and dry. No rash noted. She is not diaphoretic.  Psychiatric: She has a normal mood and affect. Judgment normal.    ED Course  Procedures (including critical care time) Labs Review Labs Reviewed - No data to display Imaging Review No results found.    MDM   Final diagnoses:  URI (upper respiratory infection)   Treating with azithromycin in addition to symptomatic treatment.  F/u if worsening or not improving     Meds ordered this encounter  Medications  . guaiFENesin-codeine 100-10 MG/5ML syrup    Sig: Take 10 mLs by mouth every 6 (six) hours as needed for  cough.    Dispense:  120 mL    Refill:  0    Order Specific Question:  Supervising Provider    Answer:  Lynne Leader, [place]  . azithromycin (ZITHROMAX Z-PAK) 250 MG tablet    Sig: Use as directed    Dispense:  6 each    Refill:  0    Order Specific Question:  Supervising Provider    Answer:  Lynne Leader, [place]  . albuterol (PROVENTIL HFA;VENTOLIN HFA) 108 (90 BASE) MCG/ACT inhaler    Sig: Inhale 2 puffs into the lungs every 4 (four) hours as needed for wheezing.    Dispense:  1 Inhaler    Refill:  0    Order Specific Question:  Supervising Provider    Answer:  Lynne Leader, [place]  . Spacer/Aero-Holding Chambers (AEROCHAMBER PLUS FLO-VU LARGE) MISC    Sig: 1 each by Other route once.    Dispense:  1 each    Refill:  0    Order Specific Question:  Supervising Provider    Answer:  Lynne Leader, [place]  . methylPREDNIsolone (MEDROL DOSPACK) 4 MG tablet    Sig: follow package directions    Dispense:  21 tablet    Refill:  0    Order Specific Question:  Supervising Provider    Answer:  Lynne Leader, [place]       Liam Graham, PA-C 09/23/13 1816

## 2013-09-23 NOTE — ED Notes (Signed)
C/o  Productive cough with thick mucus.  Chest congestion.  Cough is worse at night.  Runny nose.    No relief with otc meds.  Denies fever, n/v/d

## 2013-09-25 NOTE — ED Provider Notes (Signed)
Medical screening examination/treatment/procedure(s) were performed by a resident physician or non-physician practitioner and as the supervising physician I was immediately available for consultation/collaboration.  Rishon Thilges, MD    Josias Tomerlin S Hung Rhinesmith, MD 09/25/13 0833 

## 2013-11-16 ENCOUNTER — Emergency Department (HOSPITAL_COMMUNITY)
Admission: EM | Admit: 2013-11-16 | Discharge: 2013-11-16 | Disposition: A | Discharge status: Home / Self Care | Payer: Medicare Other | Attending: Emergency Medicine | Admitting: Emergency Medicine

## 2013-11-16 ENCOUNTER — Encounter (HOSPITAL_COMMUNITY): Payer: Self-pay | Admitting: Emergency Medicine

## 2013-11-16 ENCOUNTER — Emergency Department (HOSPITAL_COMMUNITY): Payer: Medicare Other

## 2013-11-16 DIAGNOSIS — R071 Chest pain on breathing: Secondary | ICD-10-CM | POA: Insufficient documentation

## 2013-11-16 DIAGNOSIS — Z8742 Personal history of other diseases of the female genital tract: Secondary | ICD-10-CM | POA: Insufficient documentation

## 2013-11-16 DIAGNOSIS — Z791 Long term (current) use of non-steroidal anti-inflammatories (NSAID): Secondary | ICD-10-CM | POA: Insufficient documentation

## 2013-11-16 DIAGNOSIS — K219 Gastro-esophageal reflux disease without esophagitis: Secondary | ICD-10-CM | POA: Insufficient documentation

## 2013-11-16 DIAGNOSIS — Z3202 Encounter for pregnancy test, result negative: Secondary | ICD-10-CM | POA: Insufficient documentation

## 2013-11-16 DIAGNOSIS — M7918 Myalgia, other site: Secondary | ICD-10-CM

## 2013-11-16 DIAGNOSIS — Z79899 Other long term (current) drug therapy: Secondary | ICD-10-CM | POA: Insufficient documentation

## 2013-11-16 HISTORY — DX: Leiomyoma of uterus, unspecified: D25.9

## 2013-11-16 LAB — CBC WITH DIFFERENTIAL/PLATELET
Basophils Absolute: 0 10*3/uL (ref 0.0–0.1)
Basophils Relative: 0 % (ref 0–1)
EOS ABS: 0 10*3/uL (ref 0.0–0.7)
Eosinophils Relative: 0 % (ref 0–5)
HCT: 36.6 % (ref 36.0–46.0)
HEMOGLOBIN: 11.9 g/dL — AB (ref 12.0–15.0)
Lymphocytes Relative: 34 % (ref 12–46)
Lymphs Abs: 2 10*3/uL (ref 0.7–4.0)
MCH: 25.5 pg — AB (ref 26.0–34.0)
MCHC: 32.5 g/dL (ref 30.0–36.0)
MCV: 78.5 fL (ref 78.0–100.0)
MONOS PCT: 6 % (ref 3–12)
Monocytes Absolute: 0.3 10*3/uL (ref 0.1–1.0)
NEUTROS ABS: 3.4 10*3/uL (ref 1.7–7.7)
NEUTROS PCT: 60 % (ref 43–77)
Platelets: 306 10*3/uL (ref 150–400)
RBC: 4.66 MIL/uL (ref 3.87–5.11)
RDW: 14.4 % (ref 11.5–15.5)
WBC: 5.8 10*3/uL (ref 4.0–10.5)

## 2013-11-16 LAB — LIPASE, BLOOD: LIPASE: 46 U/L (ref 11–59)

## 2013-11-16 LAB — COMPREHENSIVE METABOLIC PANEL
ALK PHOS: 34 U/L — AB (ref 39–117)
ALT: 8 U/L (ref 0–35)
AST: 11 U/L (ref 0–37)
Albumin: 3.8 g/dL (ref 3.5–5.2)
BILIRUBIN TOTAL: 0.4 mg/dL (ref 0.3–1.2)
BUN: 18 mg/dL (ref 6–23)
CO2: 24 mEq/L (ref 19–32)
Calcium: 9.1 mg/dL (ref 8.4–10.5)
Chloride: 99 mEq/L (ref 96–112)
Creatinine, Ser: 0.95 mg/dL (ref 0.50–1.10)
GFR calc non Af Amer: 70 mL/min — ABNORMAL LOW (ref >90)
GFR, EST AFRICAN AMERICAN: 81 mL/min — AB (ref >90)
GLUCOSE: 86 mg/dL (ref 70–99)
POTASSIUM: 3.6 meq/L — AB (ref 3.7–5.3)
Sodium: 136 mEq/L — ABNORMAL LOW (ref 137–147)
Total Protein: 7.4 g/dL (ref 6.0–8.3)

## 2013-11-16 LAB — URINALYSIS, ROUTINE W REFLEX MICROSCOPIC
BILIRUBIN URINE: NEGATIVE
Glucose, UA: NEGATIVE mg/dL
Hgb urine dipstick: NEGATIVE
Ketones, ur: NEGATIVE mg/dL
Leukocytes, UA: NEGATIVE
Nitrite: NEGATIVE
PH: 6.5 (ref 5.0–8.0)
Protein, ur: NEGATIVE mg/dL
SPECIFIC GRAVITY, URINE: 1.029 (ref 1.005–1.030)
Urobilinogen, UA: 1 mg/dL (ref 0.0–1.0)

## 2013-11-16 LAB — PREGNANCY, URINE: Preg Test, Ur: NEGATIVE

## 2013-11-16 MED ORDER — SODIUM CHLORIDE 0.9 % IV SOLN
1000.0000 mL | INTRAVENOUS | Status: DC
Start: 2013-11-16 — End: 2013-11-17
  Administered 2013-11-16: 1000 mL via INTRAVENOUS

## 2013-11-16 MED ORDER — HYDROCODONE-ACETAMINOPHEN 5-325 MG PO TABS: 1.0000 | ORAL_TABLET | ORAL | Status: DC | PRN

## 2013-11-16 MED ORDER — KETOROLAC TROMETHAMINE 30 MG/ML IJ SOLN
30.0000 mg | Freq: Once | INTRAMUSCULAR | Status: AC
  Administered 2013-11-16: 30 mg via INTRAVENOUS
  Filled 2013-11-16: qty 1

## 2013-11-16 MED ORDER — MORPHINE SULFATE 4 MG/ML IJ SOLN: 4.0000 mg | Freq: Once | INTRAMUSCULAR | Status: DC

## 2013-11-16 MED ORDER — SODIUM CHLORIDE 0.9 % IV SOLN
1000.0000 mL | Freq: Once | INTRAVENOUS | Status: AC
  Administered 2013-11-16: 1000 mL via INTRAVENOUS

## 2013-11-16 MED ORDER — NAPROXEN 500 MG PO TABS
500.0000 mg | ORAL_TABLET | Freq: Two times a day (BID) | ORAL | Status: DC
Start: 2013-11-16 — End: 2015-05-03

## 2013-11-16 NOTE — ED Provider Notes (Signed)
CSN: [id number]     Arrival date & time 11/16/13  1918 History   First MD Initiated Contact with Patient 11/16/13 2002     Chief Complaint  Patient presents with  . Flank Pain   Patient is a 48 y.o. female presenting with flank pain. The history is provided by the patient.  Flank Pain This is a new problem. The current episode started more than 2 days ago (5 days ago). The problem occurs constantly. The problem has been gradually worsening. Associated symptoms comments: The pain is located in the right flank.   . Exacerbated by:  WHenever she tries to turn or bend the pain will increase.  Nothing relieves the symptoms.    Past Medical History  Diagnosis Date  . Fibromyalgia   . Acid reflux   . Uterine fibroid    Past Surgical History  Procedure Laterality Date  . Carpal tunnel release    . Shoulder surgery    . Cystectomy     Family History  Problem Relation Age of Onset  . Hypertension Mother    History  Substance Use Topics  . Smoking status: Never Smoker   . Smokeless tobacco: Never Used  . Alcohol Use: No   OB History   Grav Para Term Preterm Abortions TAB SAB Ect Mult Living                 Review of Systems  Constitutional: Negative for fever.  Gastrointestinal: Negative for vomiting and diarrhea.  Genitourinary: Positive for flank pain. Negative for dysuria.  Skin: Negative for rash.  All other systems reviewed and are negative.     Allergies  Review of patient's allergies indicates no known allergies.  Home Medications   Prior to Admission medications   Medication Sig Start Date End Date Taking? Authorizing Provider  albuterol (PROVENTIL HFA;VENTOLIN HFA) 108 (90 BASE) MCG/ACT inhaler Inhale 2 puffs into the lungs every 4 (four) hours as needed for wheezing. 09/23/13  Yes Liam Graham, PA-C  diclofenac (VOLTAREN) 75 MG EC tablet Take 75 mg by mouth 2 (two) times daily.   Yes Historical Provider, MD  esomeprazole (NEXIUM) 40 MG capsule Take 40 mg by  mouth daily before breakfast.   Yes Historical Provider, MD  HYDROcodone-acetaminophen (NORCO) 10-325 MG per tablet Take 1 tablet by mouth every 6 (six) hours as needed (pain).   Yes Historical Provider, MD  ibuprofen (ADVIL,MOTRIN) 800 MG tablet Take 800 mg by mouth daily as needed (pain.).   Yes Historical Provider, MD  pregabalin (LYRICA) 75 MG capsule Take 75 mg by mouth 2 (two) times daily.   Yes Historical Provider, MD  Spacer/Aero-Holding Chambers (AEROCHAMBER PLUS FLO-VU LARGE) MISC 1 each by Other route once. 09/23/13  Yes Freeman Caldron Baker, PA-C  sucralfate (CARAFATE) 1 G tablet Take 1 tablet (1 g total) by mouth 4 (four) times daily. 07/11/12  Yes Malvin Johns, MD  tiZANidine (ZANAFLEX) 4 MG capsule Take 4 mg by mouth 3 (three) times daily.   Yes Historical Provider, MD  zonisamide (ZONEGRAN) 25 MG capsule Take 75 mg by mouth daily.   Yes Historical Provider, MD   BP 122/71  Pulse 74  Temp(Src) 97.7 F (36.5 C) (Oral)  Resp 18  Ht 5\' 2"  (1.575 m)  Wt 125 lb (56.7 kg)  BMI 22.86 kg/m2  SpO2 99%  LMP 10/24/2013 Physical Exam  Nursing note and vitals reviewed. Constitutional: She appears well-developed and well-nourished. No distress.  HENT:  Head: Normocephalic and atraumatic.  Right Ear: External ear normal.  Left Ear: External ear normal.  Eyes: Conjunctivae are normal. Right eye exhibits no discharge. Left eye exhibits no discharge. No scleral icterus.  Neck: Neck supple. No tracheal deviation present.  Cardiovascular: Normal rate, regular rhythm and intact distal pulses.   Pulmonary/Chest: Effort normal and breath sounds normal. No stridor. No respiratory distress. She has no wheezes. She has no rales. She exhibits tenderness ( discrete area of ttp, right mid axillar lower intercostal region).  Abdominal: Soft. Bowel sounds are normal. She exhibits no distension. There is no tenderness. There is no rebound and no guarding.  Musculoskeletal: She exhibits no edema and no  tenderness.  Neurological: She is alert. She has normal strength. No cranial nerve deficit (no facial droop, extraocular movements intact, no slurred speech) or sensory deficit. She exhibits normal muscle tone. She displays no seizure activity. Coordination normal.  Skin: Skin is warm and dry. No rash noted.  Psychiatric: She has a normal mood and affect.    ED Course  Procedures (including critical care time) Labs Review Labs Reviewed  URINALYSIS, ROUTINE W REFLEX MICROSCOPIC - Abnormal; Notable for the following:    APPearance CLOUDY (*)    All other components within normal limits  CBC WITH DIFFERENTIAL - Abnormal; Notable for the following:    Hemoglobin 11.9 (*)    MCH 25.5 (*)    All other components within normal limits  COMPREHENSIVE METABOLIC PANEL - Abnormal; Notable for the following:    Sodium 136 (*)    Potassium 3.6 (*)    Alkaline Phosphatase 34 (*)    GFR calc non Af Amer 70 (*)    GFR calc Af Amer 81 (*)    All other components within normal limits  LIPASE, BLOOD  PREGNANCY, URINE    Imaging Review Dg Chest 2 View  11/16/2013   CLINICAL DATA:  Cough for 2 months.  Right flank pain.  EXAM: CHEST  2 VIEW  COMPARISON:  08/11/2010 as well as chest CT 04/27/2012  FINDINGS: Lungs are clear. The cardiomediastinal silhouette and remainder of the exam is unchanged.  IMPRESSION: No active cardiopulmonary disease.   Electronically Signed   By: Marin Olp M.D.   On: 11/16/2013 20:31    MDM   Final diagnoses:  Intercostal muscle pain    Labs are normal.  No abdominal ttp on exam.  Symptoms unrelated to eating and drinking.  Doubt gallbladder etiology.  Nl vitals.  Low risk for PE.  Perc negative.  Suspect muscular etiology.  Rx pain meds.  Follow up one week.  Return for fever, vomiting.    Kathalene Frames, MD 11/16/13 2221

## 2013-11-16 NOTE — ED Notes (Signed)
Patient transported to X-ray 

## 2013-11-16 NOTE — ED Notes (Signed)
Pt states has been having R flank pain x 5 days, denies n/v/d, denies urinary problems, denies hx of kidney stones.

## 2013-11-16 NOTE — ED Notes (Signed)
Pt reports sharp R flank pain x 5 days, started suddenly when pt was sitting in car. Very tender to palpation.

## 2013-11-16 NOTE — Discharge Instructions (Signed)

## 2014-02-14 ENCOUNTER — Other Ambulatory Visit: Payer: Self-pay | Admitting: Obstetrics and Gynecology

## 2014-02-14 ENCOUNTER — Other Ambulatory Visit (HOSPITAL_COMMUNITY)
Admission: RE | Admit: 2014-02-14 | Discharge: 2014-02-14 | Disposition: A | Discharge status: Home / Self Care | Payer: Medicare Other | Source: Ambulatory Visit | Attending: Obstetrics and Gynecology | Admitting: Obstetrics and Gynecology

## 2014-02-14 DIAGNOSIS — Z124 Encounter for screening for malignant neoplasm of cervix: Secondary | ICD-10-CM | POA: Insufficient documentation

## 2014-02-16 LAB — CYTOLOGY - PAP

## 2014-08-18 ENCOUNTER — Encounter (HOSPITAL_COMMUNITY): Payer: Self-pay | Admitting: Emergency Medicine

## 2014-08-18 ENCOUNTER — Emergency Department (INDEPENDENT_AMBULATORY_CARE_PROVIDER_SITE_OTHER)
Admission: EM | Admit: 2014-08-18 | Discharge: 2014-08-18 | Disposition: A | Discharge status: Home / Self Care | Payer: Medicare Other | Source: Home / Self Care | Attending: Emergency Medicine | Admitting: Emergency Medicine

## 2014-08-18 DIAGNOSIS — J020 Streptococcal pharyngitis: Secondary | ICD-10-CM

## 2014-08-18 LAB — POCT RAPID STREP A: Streptococcus, Group A Screen (Direct): NEGATIVE

## 2014-08-18 MED ORDER — AMOXICILLIN 500 MG PO CAPS: 500.0000 mg | ORAL_CAPSULE | Freq: Two times a day (BID) | ORAL | Status: DC

## 2014-08-18 NOTE — Discharge Instructions (Signed)
You likely have strep throat. Take amoxicillin 1 pill twice a day for 10 days. Use salt water gargles and Chloraseptic spray as needed for sore throat. Take Tylenol or ibuprofen as needed for body aches or fever. Follow-up as needed.

## 2014-08-18 NOTE — ED Provider Notes (Signed)
CSN: [id number]     Arrival date & time 08/18/14  1533 History   First MD Initiated Contact with Patient 08/18/14 1619     Chief Complaint  Patient presents with  . Sore Throat   (Consider location/radiation/quality/duration/timing/severity/associated sxs/prior Treatment) HPI  She is a 49 year old woman here for evaluation of sore throat. Her symptoms started last night and are worse today. She reports sore throat and body aches. No headaches. No known fever. No nasal congestion or runny nose. No cough or shortness of breath. No nausea or vomiting. No known sick contacts.  Past Medical History  Diagnosis Date  . Fibromyalgia   . Acid reflux   . Uterine fibroid    Past Surgical History  Procedure Laterality Date  . Carpal tunnel release    . Shoulder surgery    . Cystectomy     Family History  Problem Relation Age of Onset  . Hypertension Mother    History  Substance Use Topics  . Smoking status: Never Smoker   . Smokeless tobacco: Never Used  . Alcohol Use: No   OB History    No data available     Review of Systems  Constitutional: Negative for fever and chills.  HENT: Positive for sore throat. Negative for congestion, rhinorrhea and trouble swallowing.   Respiratory: Negative for cough and shortness of breath.   Gastrointestinal: Negative for nausea and vomiting.  Musculoskeletal: Positive for myalgias.  Neurological: Negative for headaches.    Allergies  Review of patient's allergies indicates no known allergies.  Home Medications   Prior to Admission medications   Medication Sig Start Date End Date Taking? Authorizing Provider  albuterol (PROVENTIL HFA;VENTOLIN HFA) 108 (90 BASE) MCG/ACT inhaler Inhale 2 puffs into the lungs every 4 (four) hours as needed for wheezing. 09/23/13   Liam Graham, PA-C  amoxicillin (AMOXIL) 500 MG capsule Take 1 capsule (500 mg total) by mouth 2 (two) times daily. 08/18/14   Melony Overly, MD  diclofenac (VOLTAREN) 75 MG EC  tablet Take 75 mg by mouth 2 (two) times daily.    Historical Provider, MD  esomeprazole (NEXIUM) 40 MG capsule Take 40 mg by mouth daily before breakfast.    Historical Provider, MD  HYDROcodone-acetaminophen (NORCO) 10-325 MG per tablet Take 1 tablet by mouth every 6 (six) hours as needed (pain).    Historical Provider, MD  HYDROcodone-acetaminophen (NORCO/VICODIN) 5-325 MG per tablet Take 1-2 tablets by mouth every 4 (four) hours as needed. 11/16/13   Dorie Rank, MD  ibuprofen (ADVIL,MOTRIN) 800 MG tablet Take 800 mg by mouth daily as needed (pain.).    Historical Provider, MD  naproxen (NAPROSYN) 500 MG tablet Take 1 tablet (500 mg total) by mouth 2 (two) times daily with a meal. 11/16/13   Dorie Rank, MD  pregabalin (LYRICA) 75 MG capsule Take 75 mg by mouth 2 (two) times daily.    Historical Provider, MD  Spacer/Aero-Holding Chambers (AEROCHAMBER PLUS FLO-VU LARGE) MISC 1 each by Other route once. 09/23/13   Liam Graham, PA-C  sucralfate (CARAFATE) 1 G tablet Take 1 tablet (1 g total) by mouth 4 (four) times daily. 07/11/12   Malvin Johns, MD  tiZANidine (ZANAFLEX) 4 MG capsule Take 4 mg by mouth 3 (three) times daily.    Historical Provider, MD  zonisamide (ZONEGRAN) 25 MG capsule Take 75 mg by mouth daily.    Historical Provider, MD   BP 115/78 mmHg  Pulse 77  Temp(Src) 98.2 F (36.8 C) (Oral)  Resp 18  SpO2 98%  LMP 07/20/2014 Physical Exam  Constitutional: She is oriented to person, place, and time. She appears well-developed and well-nourished. No distress.  HENT:  Head: Normocephalic and atraumatic.  Mouth/Throat: Mucous membranes are normal. Posterior oropharyngeal erythema present. No oropharyngeal exudate.  Cardiovascular: Normal rate, regular rhythm and normal heart sounds.   Pulmonary/Chest: Effort normal and breath sounds normal. No respiratory distress. She has no wheezes. She has no rales.  Lymphadenopathy:    She has no cervical adenopathy.  Neurological: She is alert  and oriented to person, place, and time.    ED Course  Procedures (including critical care time) Labs Review Labs Reviewed  POCT RAPID STREP A ([facility] URG CARE ONLY)    Imaging Review No results found.   MDM   1. Strep pharyngitis    Rapid strep is negative, but clinically she has strep throat. We'll treat with amoxicillin. Symptomatic care with Chloraseptic spray, salt water gargles, Tylenol, Motrin. Follow-up as needed.    Melony Overly, MD 08/18/14 [phone]

## 2014-08-18 NOTE — ED Notes (Signed)
Sore throat started last night, general body aches, unknown if running a fever per patient.  Denies ear pain, no runny nose or cough

## 2014-08-20 LAB — CULTURE, GROUP A STREP

## 2014-09-23 ENCOUNTER — Encounter (HOSPITAL_COMMUNITY): Payer: Self-pay | Admitting: *Deleted

## 2014-09-23 ENCOUNTER — Emergency Department (INDEPENDENT_AMBULATORY_CARE_PROVIDER_SITE_OTHER)
Admission: EM | Admit: 2014-09-23 | Discharge: 2014-09-23 | Disposition: A | Discharge status: Home / Self Care | Payer: Medicare Other | Source: Home / Self Care | Attending: Emergency Medicine | Admitting: Emergency Medicine

## 2014-09-23 DIAGNOSIS — J020 Streptococcal pharyngitis: Secondary | ICD-10-CM | POA: Diagnosis not present

## 2014-09-23 DIAGNOSIS — R5381 Other malaise: Secondary | ICD-10-CM

## 2014-09-23 LAB — POCT RAPID STREP A: STREPTOCOCCUS, GROUP A SCREEN (DIRECT): NEGATIVE

## 2014-09-23 MED ORDER — AMOXICILLIN 500 MG PO CAPS: 500.0000 mg | ORAL_CAPSULE | Freq: Two times a day (BID) | ORAL | Status: DC

## 2014-09-23 NOTE — Discharge Instructions (Signed)
Pharyngitis Pharyngitis is a sore throat (pharynx). There is redness, pain, and swelling of your throat. HOME CARE   Drink enough fluids to keep your pee (urine) clear or pale yellow.  Only take medicine as told by your doctor.  You may get sick again if you do not take medicine as told. Finish your medicines, even if you start to feel better.  Do not take aspirin.  Rest.  Rinse your mouth (gargle) with salt water ( tsp of salt per 1 qt of water) every 1-2 hours. This will help the pain.  If you are not at risk for choking, you can suck on hard candy or sore throat lozenges. GET HELP IF:  You have large, tender lumps on your neck.  You have a rash.  You cough up green, yellow-brown, or bloody spit. GET HELP RIGHT AWAY IF:   You have a stiff neck.  You drool or cannot swallow liquids.  You throw up (vomit) or are not able to keep medicine or liquids down.  You have very bad pain that does not go away with medicine.  You have problems breathing (not from a stuffy nose). MAKE SURE YOU:   Understand these instructions.  Will watch your condition.  Will get help right away if you are not doing well or get worse. Document Released: 01/07/2008 Document Revised: 05/11/2013 Document Reviewed: 03/28/2013 [facility] Patient Information 2015 [place], [place]. This information is not intended to replace advice given to you by your health care provider. Make sure you discuss any questions you have with your health care provider.    Strep test was negative. However given your exam will have you complete a course of Antibiotics. Use Motrin/Ibuprofen 800mg  every 8 hours for pain and fevers/chills

## 2014-09-23 NOTE — ED Provider Notes (Signed)
CSN: [id number]     Arrival date & time 09/23/14  1323 History   First MD Initiated Contact with Patient 09/23/14 1403     Chief Complaint  Patient presents with  . URI   (Consider location/radiation/quality/duration/timing/severity/associated sxs/prior Treatment) HPI Comments: Patient is a 49 yo black female who presents with onset sore throat yesterday, chills, myalgias and overall fatigue. She was diagnosed with Strep 8 weeks ago and reports that "this feels the same". She denies cough or nasal congestion. No known exposure is noted.   Patient is a 49 y.o. female presenting with URI. The history is provided by the patient.  URI Presenting symptoms: fatigue and sore throat   Presenting symptoms: no fever and no rhinorrhea   Associated symptoms: myalgias     Past Medical History  Diagnosis Date  . Fibromyalgia   . Acid reflux   . Uterine fibroid    Past Surgical History  Procedure Laterality Date  . Carpal tunnel release    . Shoulder surgery    . Cystectomy     Family History  Problem Relation Age of Onset  . Hypertension Mother    History  Substance Use Topics  . Smoking status: Never Smoker   . Smokeless tobacco: Never Used  . Alcohol Use: No   OB History    No data available     Review of Systems  Constitutional: Positive for chills and fatigue. Negative for fever.  HENT: Positive for sore throat. Negative for rhinorrhea and sinus pressure.   Respiratory: Negative.   Musculoskeletal: Positive for myalgias.  Allergic/Immunologic: Negative.   Neurological: Negative.   Psychiatric/Behavioral: Negative.     Allergies  Review of patient's allergies indicates no known allergies.  Home Medications   Prior to Admission medications   Medication Sig Start Date End Date Taking? Authorizing Provider  albuterol (PROVENTIL HFA;VENTOLIN HFA) 108 (90 BASE) MCG/ACT inhaler Inhale 2 puffs into the lungs every 4 (four) hours as needed for wheezing. 09/23/13   Liam Graham, PA-C  amoxicillin (AMOXIL) 500 MG capsule Take 1 capsule (500 mg total) by mouth 2 (two) times daily. 09/23/14   Bjorn Pippin, PA-C  diclofenac (VOLTAREN) 75 MG EC tablet Take 75 mg by mouth 2 (two) times daily.    Historical Provider, MD  esomeprazole (NEXIUM) 40 MG capsule Take 40 mg by mouth daily before breakfast.    Historical Provider, MD  HYDROcodone-acetaminophen (NORCO) 10-325 MG per tablet Take 1 tablet by mouth every 6 (six) hours as needed (pain).    Historical Provider, MD  HYDROcodone-acetaminophen (NORCO/VICODIN) 5-325 MG per tablet Take 1-2 tablets by mouth every 4 (four) hours as needed. 11/16/13   Dorie Rank, MD  ibuprofen (ADVIL,MOTRIN) 800 MG tablet Take 800 mg by mouth daily as needed (pain.).    Historical Provider, MD  naproxen (NAPROSYN) 500 MG tablet Take 1 tablet (500 mg total) by mouth 2 (two) times daily with a meal. 11/16/13   Dorie Rank, MD  pregabalin (LYRICA) 75 MG capsule Take 75 mg by mouth 2 (two) times daily.    Historical Provider, MD  Spacer/Aero-Holding Chambers (AEROCHAMBER PLUS FLO-VU LARGE) MISC 1 each by Other route once. 09/23/13   Liam Graham, PA-C  sucralfate (CARAFATE) 1 G tablet Take 1 tablet (1 g total) by mouth 4 (four) times daily. 07/11/12   Malvin Johns, MD  tiZANidine (ZANAFLEX) 4 MG capsule Take 4 mg by mouth 3 (three) times daily.    Historical Provider, MD  zonisamide (  ZONEGRAN) 25 MG capsule Take 75 mg by mouth daily.    Historical Provider, MD   BP 118/70 mmHg  Pulse 70  Temp(Src) 98.9 F (37.2 C) (Oral)  Resp 18  SpO2 100%  LMP 09/20/2014 Physical Exam  Constitutional: She is oriented to person, place, and time. She appears well-developed and well-nourished. No distress.  HENT:  Head: Normocephalic and atraumatic.  Right Ear: External ear normal.  Left Ear: External ear normal.  Mouth/Throat: Oropharyngeal exudate present.  Injected oropharynx with mild exudate left tonsil. +1  Neck: Normal range of motion. Neck supple.   Cardiovascular: Normal rate and regular rhythm.   Pulmonary/Chest: Effort normal and breath sounds normal. No respiratory distress.  Lymphadenopathy:    She has no cervical adenopathy.  Neurological: She is alert and oriented to person, place, and time.  Skin: Skin is warm and dry. No rash noted. She is not diaphoretic.  Psychiatric: Her behavior is normal.  Nursing note and vitals reviewed.   ED Course  Procedures (including critical care time) Labs Review Labs Reviewed  POCT RAPID STREP A ([facility] CARE ONLY)    Imaging Review No results found.   MDM   1. Strep pharyngitis   2. Malaise    Clinical presentation of strep though rapid negative. Will treat with Amoxicillin and supportive care. F/U if worsens.   Bjorn Pippin, PA-C 09/23/14 1530

## 2014-09-23 NOTE — ED Notes (Signed)
Pt  Reports  Symptoms  Of       Headache   sorethroat      Chills  And  Body aches     With  Symptoms   Starting  Yesterday       Pt is  Sitting  Upright on  The  Exam  Table  Speaking in  Complete  sentances      Masked   And  Is  In no acute  Distress       Pt   Reports  Symptoms  Not  releived  By OTC meds

## 2014-09-26 LAB — CULTURE, GROUP A STREP: Strep A Culture: NEGATIVE

## 2014-10-24 ENCOUNTER — Emergency Department (INDEPENDENT_AMBULATORY_CARE_PROVIDER_SITE_OTHER): Payer: Medicare Other

## 2014-10-24 ENCOUNTER — Emergency Department (INDEPENDENT_AMBULATORY_CARE_PROVIDER_SITE_OTHER)
Admission: EM | Admit: 2014-10-24 | Discharge: 2014-10-24 | Disposition: A | Discharge status: Home / Self Care | Payer: Medicare Other | Source: Home / Self Care | Attending: Family Medicine | Admitting: Family Medicine

## 2014-10-24 ENCOUNTER — Encounter (HOSPITAL_COMMUNITY): Payer: Self-pay | Admitting: Emergency Medicine

## 2014-10-24 DIAGNOSIS — R05 Cough: Secondary | ICD-10-CM

## 2014-10-24 DIAGNOSIS — R053 Chronic cough: Secondary | ICD-10-CM

## 2014-10-24 MED ORDER — FLUTICASONE PROPIONATE 50 MCG/ACT NA SUSP: 2.0000 | Freq: Every day | NASAL | Status: DC

## 2014-10-24 MED ORDER — BENZONATATE 200 MG PO CAPS: 200.0000 mg | ORAL_CAPSULE | Freq: Three times a day (TID) | ORAL | Status: DC

## 2014-10-24 MED ORDER — ALBUTEROL SULFATE HFA 108 (90 BASE) MCG/ACT IN AERS: 2.0000 | INHALATION_SPRAY | Freq: Four times a day (QID) | RESPIRATORY_TRACT | Status: AC | PRN

## 2014-10-24 NOTE — Discharge Instructions (Signed)
Thank you for coming in today. Call or go to the emergency room if you get worse, have trouble breathing, have chest pains, or palpitations.  Use over-the-counter Zaditor eyedrops (Ketotifen) Use over-the-counter Zyrtec (cetirizine)  Use Systane artificial tears as needed  Follow up with your doctor.    Cough, Adult  A cough is a reflex that helps clear your throat and airways. It can help heal the body or may be a reaction to an irritated airway. A cough may only last 2 or 3 weeks (acute) or may last more than 8 weeks (chronic).  CAUSES Acute cough:  Viral or bacterial infections. Chronic cough:  Infections.  Allergies.  Asthma.  Post-nasal drip.  Smoking.  Heartburn or acid reflux.  Some medicines.  Chronic lung problems (COPD).  Cancer. SYMPTOMS   Cough.  Fever.  Chest pain.  Increased breathing rate.  High-pitched whistling sound when breathing (wheezing).  Colored mucus that you cough up (sputum). TREATMENT   A bacterial cough may be treated with antibiotic medicine.  A viral cough must run its course and will not respond to antibiotics.  Your caregiver may recommend other treatments if you have a chronic cough. HOME CARE INSTRUCTIONS   Only take over-the-counter or prescription medicines for pain, discomfort, or fever as directed by your caregiver. Use cough suppressants only as directed by your caregiver.  Use a cold steam vaporizer or humidifier in your bedroom or home to help loosen secretions.  Sleep in a semi-upright position if your cough is worse at night.  Rest as needed.  Stop smoking if you smoke. SEEK IMMEDIATE MEDICAL CARE IF:   You have pus in your sputum.  Your cough starts to worsen.  You cannot control your cough with suppressants and are losing sleep.  You begin coughing up blood.  You have difficulty breathing.  You develop pain which is getting worse or is uncontrolled with medicine.  You have a fever. MAKE SURE  YOU:   Understand these instructions.  Will watch your condition.  Will get help right away if you are not doing well or get worse. Document Released: 01/17/2011 Document Revised: 10/13/2011 Document Reviewed: 01/17/2011 [facility] Patient Information 2015 [place], [place]. This information is not intended to replace advice given to you by your health care provider. Make sure you discuss any questions you have with your health care provider.

## 2014-10-24 NOTE — ED Notes (Signed)
Cough, sore throat, body aches,  Onset of symptoms 3/20

## 2014-10-24 NOTE — ED Provider Notes (Signed)
Kerry Ortiz is a 49 y.o. female who presents to Urgent Care today for sore throat and body ache and cough. Symptoms persisted and over the past 6 weeks. Patient takes Nexium daily for acid reflux. She denies any history of asthma. She's tried some over-the-counter medications which helps some. No fevers or chills or vomiting or diarrhea. No trouble breathing or wheezing.   Past Medical History  Diagnosis Date  . Fibromyalgia   . Acid reflux   . Uterine fibroid    Past Surgical History  Procedure Laterality Date  . Carpal tunnel release    . Shoulder surgery    . Cystectomy     History  Substance Use Topics  . Smoking status: Never Smoker   . Smokeless tobacco: Never Used  . Alcohol Use: No   ROS as above Medications: No current facility-administered medications for this encounter.   Current Outpatient Prescriptions  Medication Sig Dispense Refill  . albuterol (PROVENTIL HFA;VENTOLIN HFA) 108 (90 BASE) MCG/ACT inhaler Inhale 2 puffs into the lungs every 6 (six) hours as needed for wheezing or shortness of breath. 1 Inhaler 2  . amoxicillin (AMOXIL) 500 MG capsule Take 1 capsule (500 mg total) by mouth 2 (two) times daily. 20 capsule 0  . benzonatate (TESSALON) 200 MG capsule Take 1 capsule (200 mg total) by mouth every 8 (eight) hours. 30 capsule 0  . diclofenac (VOLTAREN) 75 MG EC tablet Take 75 mg by mouth 2 (two) times daily.    Marland Kitchen esomeprazole (NEXIUM) 40 MG capsule Take 40 mg by mouth daily before breakfast.    . fluticasone (FLONASE) 50 MCG/ACT nasal spray Place 2 sprays into both nostrils daily. 16 g 2  . HYDROcodone-acetaminophen (NORCO) 10-325 MG per tablet Take 1 tablet by mouth every 6 (six) hours as needed (pain).    Marland Kitchen HYDROcodone-acetaminophen (NORCO/VICODIN) 5-325 MG per tablet Take 1-2 tablets by mouth every 4 (four) hours as needed. 20 tablet 0  . ibuprofen (ADVIL,MOTRIN) 800 MG tablet Take 800 mg by mouth daily as needed (pain.).    Marland Kitchen naproxen (NAPROSYN) 500 MG  tablet Take 1 tablet (500 mg total) by mouth 2 (two) times daily with a meal. 30 tablet 0  . pregabalin (LYRICA) 75 MG capsule Take 75 mg by mouth 2 (two) times daily.    Marland Kitchen Spacer/Aero-Holding Chambers (AEROCHAMBER PLUS FLO-VU LARGE) MISC 1 each by Other route once. 1 each 0  . sucralfate (CARAFATE) 1 G tablet Take 1 tablet (1 g total) by mouth 4 (four) times daily. 20 tablet 0  . tiZANidine (ZANAFLEX) 4 MG capsule Take 4 mg by mouth 3 (three) times daily.    Marland Kitchen zonisamide (ZONEGRAN) 25 MG capsule Take 75 mg by mouth daily.     No Known Allergies   Exam:  BP 114/68 mmHg  Pulse 60  Temp(Src) 99.4 F (37.4 C) (Oral)  Resp 12  SpO2 100%  LMP 10/19/2014 (Approximate) Gen: Well NAD HEENT: EOMI,  MMM posterior pharynx with cobblestoning. Clear nasal discharge. Normal tympanic membranes bilaterally. Lungs: Normal work of breathing. CTABL Heart: RRR no MRG Abd: NABS, Soft. Nondistended, Nontender Exts: Brisk capillary refill, warm and well perfused.   No results found for this or any previous visit (from the past 24 hour(s)). Dg Chest 2 View  10/24/2014   CLINICAL DATA:  Cough, sore throat, body aches  EXAM: CHEST  2 VIEW  COMPARISON:  11/16/2013  FINDINGS: Cardiomediastinal silhouette is stable. No acute infiltrate or pleural effusion. No pulmonary edema.  Bony thorax is stable.  IMPRESSION: No active cardiopulmonary disease.   Electronically Signed   By: Lahoma Crocker M.D.   On: 10/24/2014 14:14    Assessment and Plan: 49 y.o. female with chronic cough. Likely due to allergic rhinitis with postnasal drip. Asthma baby component. Treat with Flonase nasal spray, albuterol, and Tessalon. Follow-up with PCP.  Discussed warning signs or symptoms. Please see discharge instructions. Patient expresses understanding.     Gregor Hams, MD 10/24/14 [phone]

## 2015-05-03 ENCOUNTER — Ambulatory Visit (INDEPENDENT_AMBULATORY_CARE_PROVIDER_SITE_OTHER): Payer: Medicare Other | Admitting: Neurology

## 2015-05-03 ENCOUNTER — Encounter: Payer: Self-pay | Admitting: Neurology

## 2015-05-03 VITALS — BP 100/63 | HR 60 | Ht 62.0 in | Wt 117.0 lb

## 2015-05-03 DIAGNOSIS — R202 Paresthesia of skin: Secondary | ICD-10-CM | POA: Diagnosis not present

## 2015-05-03 MED ORDER — CITALOPRAM HYDROBROMIDE 10 MG PO TABS: 10.0000 mg | ORAL_TABLET | Freq: Every day | ORAL | Status: DC

## 2015-05-03 NOTE — Progress Notes (Signed)
PATIENT: Kerry Ortiz DOB: 1966-05-28  Chief Complaint  Patient presents with  . Disturbance of skin Sensation    Reports her skin is sensitive to touch and tingling in various areas of her body.  Symptoms have progressed over the last two years. Initially, her symptoms were intermittent but now the sensations are constant.     HISTORICAL  Kerry Ortiz is a 49 years old right-handed female, seen in refer by  her primary care physician Dr. Glendale Chard in May 03 2015 for evaluation of bilateral feet and lower extremity achy pain  I have reviewed and summarized office note, she had a history of fibromyalgia, carpal tunnel release surgery in the past, chronic migraines, she went on disability since 2008, complains of diffuse body achy pain, on polypharmacy treatment, including Elavil, Lyrica, Zonegran, She continue complains of depression symptoms, tearful during today's interview  Since 2012, she began to notice bilateral feet cold burning sensation, involving bottom and top of her feet, gradually trending up to knee level, getting worse with pressure, barium weight, she has chronic low back and neck pain, denies radiating pain, gait difficulty due to pain, no bowel and bladder incontinence, no bilateral upper extremity paresthesia or weakness,  She complains of feeling tired all the time, lack of interest, I have reviewed laboratory in July 2016, A1c was elevated 5.9, normal B12 567, TSH 1.4, CMP, she had more laboratory evaluation in August 2016   REVIEW OF SYSTEMS: Full 14 system review of systems performed and notable only for fever, chill, fatigue, chest pain, swelling in legs, rash, shortness of breath, cough, anemia, easy bruising, feeling hot, feeling cold, joint pain, joint swelling, cramps, achy muscles, skin sensitivity, headaches, numbness, weakness, dizziness, sleepiness, restless leg, much sleep, disinterested in activities  ALLERGIES: No Known Allergies  HOME  MEDICATIONS: Current Outpatient Prescriptions  Medication Sig Dispense Refill  . albuterol (PROVENTIL HFA;VENTOLIN HFA) 108 (90 BASE) MCG/ACT inhaler Inhale 2 puffs into the lungs every 6 (six) hours as needed for wheezing or shortness of breath. 1 Inhaler 2  . amitriptyline (ELAVIL) 25 MG tablet Take 25 mg by mouth at bedtime.    . cyclobenzaprine (FLEXERIL) 10 MG tablet Take 10 mg by mouth 3 (three) times daily as needed for muscle spasms.    . diclofenac (VOLTAREN) 75 MG EC tablet Take 75 mg by mouth 2 (two) times daily.    Marland Kitchen esomeprazole (NEXIUM) 40 MG capsule Take 40 mg by mouth daily before breakfast.    . fluticasone (FLONASE) 50 MCG/ACT nasal spray Place 2 sprays into both nostrils daily. 16 g 2  . HYDROcodone-acetaminophen (NORCO) 10-325 MG per tablet Take 1 tablet by mouth every 6 (six) hours as needed (pain).    Marland Kitchen ibuprofen (ADVIL,MOTRIN) 800 MG tablet Take 800 mg by mouth daily as needed (pain.).    Marland Kitchen pregabalin (LYRICA) 150 MG capsule Take 150 mg by mouth daily.    Marland Kitchen Spacer/Aero-Holding Chambers (AEROCHAMBER PLUS FLO-VU LARGE) MISC 1 each by Other route once. 1 each 0  . sucralfate (CARAFATE) 1 G tablet Take 1 tablet (1 g total) by mouth 4 (four) times daily. 20 tablet 0  . zonisamide (ZONEGRAN) 25 MG capsule Take 75 mg by mouth daily.       PAST MEDICAL HISTORY: Past Medical History  Diagnosis Date  . Fibromyalgia   . Acid reflux   . Uterine fibroid   . Disturbance of skin sensation     PAST SURGICAL HISTORY: Past Surgical  History  Procedure Laterality Date  . Carpal tunnel release    . Shoulder surgery    . Cystectomy      FAMILY HISTORY: Family History  Problem Relation Age of Onset  . Hypertension Mother   . Diabetes Maternal Grandmother   . Arrhythmia Mother   . Thyroid disease Mother   . Kidney disease Sister   . Multiple sclerosis Cousin     SOCIAL HISTORY:  Social History   Social History  . Marital Status: Single    Spouse Name: N/A  . Number  of Children: 1  . Years of Education: HS   Occupational History  . Disabled    Social History Main Topics  . Smoking status: Never Smoker   . Smokeless tobacco: Never Used  . Alcohol Use: No  . Drug Use: No  . Sexual Activity: Not Currently    Birth Control/ Protection: None   Other Topics Concern  . Not on file   Social History Narrative   Lives at home alone.   Right-handed.   No caffeine use.     PHYSICAL EXAM   Filed Vitals:   05/03/15 1128  BP: 100/63  Pulse: 60  Height: 5\' 2"  (1.575 m)  Weight: 117 lb (53.071 kg)    Not recorded      Body mass index is 21.39 kg/(m^2).  PHYSICAL EXAMNIATION:  Gen: NAD, conversant, well nourised, obese, well groomed                     Cardiovascular: Regular rate rhythm, no peripheral edema, warm, nontender. Eyes: Conjunctivae clear without exudates or hemorrhage Neck: Supple, no carotid bruise. Pulmonary: Clear to auscultation bilaterally   NEUROLOGICAL EXAM:  MENTAL STATUS: Speech:    Speech is normal; fluent and spontaneous with normal comprehension.  Cognition:     Orientation to time, place and person     Normal recent and remote memory     Normal Attention span and concentration     Normal Language, naming, repeating,spontaneous speech     Fund of knowledge   CRANIAL NERVES: CN II: Visual fields are full to confrontation. Fundoscopic exam is normal with sharp discs and no vascular changes. Pupils are round equal and briskly reactive to light. CN III, IV, VI: extraocular movement are normal. No ptosis. CN V: Facial sensation is intact to pinprick in all 3 divisions bilaterally. Corneal responses are intact.  CN VII: Face is symmetric with normal eye closure and smile. CN VIII: Hearing is normal to rubbing fingers CN IX, X: Palate elevates symmetrically. Phonation is normal. CN XI: Head turning and shoulder shrug are intact CN XII: Tongue is midline with normal movements and no atrophy.  MOTOR: There is  no pronator drift of out-stretched arms. Muscle bulk and tone are normal. Muscle strength is normal.  REFLEXES: Reflexes are 2+ and symmetric at the biceps, triceps, knees, and ankles. Plantar responses are flexor.  SENSORY: Intact to light touch, pinprick, position sense, and vibration sense are intact in fingers and toes.  COORDINATION: Rapid alternating movements and fine finger movements are intact. There is no dysmetria on finger-to-nose and heel-knee-shin.    GAIT/STANCE: Posture is normal. Gait is steady with normal steps, base, arm swing, and turning. Heel and toe walking are normal. Tandem gait is normal.  Romberg is absent.   DIAGNOSTIC DATA (LABS, IMAGING, TESTING) - I reviewed patient records, labs, notes, testing and imaging myself where available.   ASSESSMENT AND PLAN  Muriel  KASHAY CAVENAUGH is a 49 y.o. female  Bilateral lower extremity paresthesia  Differentiation diagnosis includes peripheral neuropathy versus musculoskeletal etiology with her diagnosis of fibromyalgia  EMG nerve conduction study  Get laboratory result from her primary care physician Dr. Glendale Chard  Celexa 10 mg daily  Marcial Pacas, M.D. Ph.D.  [facility] [address] [place], [place] [address] Ph: [phone] Fax: [phone]  CC: Dr. Glendale Chard

## 2015-06-04 ENCOUNTER — Encounter: Payer: Medicare Other | Admitting: Neurology

## 2015-06-25 ENCOUNTER — Encounter: Payer: Self-pay | Admitting: Gastroenterology

## 2015-06-29 ENCOUNTER — Encounter (HOSPITAL_COMMUNITY): Payer: Self-pay | Admitting: Emergency Medicine

## 2015-06-29 ENCOUNTER — Emergency Department (HOSPITAL_COMMUNITY): Payer: Medicare Other

## 2015-06-29 ENCOUNTER — Emergency Department (HOSPITAL_COMMUNITY)
Admission: EM | Admit: 2015-06-29 | Discharge: 2015-06-30 | Disposition: A | Discharge status: Home / Self Care | Payer: Medicare Other | Attending: Emergency Medicine | Admitting: Emergency Medicine

## 2015-06-29 DIAGNOSIS — Z7951 Long term (current) use of inhaled steroids: Secondary | ICD-10-CM | POA: Insufficient documentation

## 2015-06-29 DIAGNOSIS — Z79899 Other long term (current) drug therapy: Secondary | ICD-10-CM | POA: Diagnosis not present

## 2015-06-29 DIAGNOSIS — R112 Nausea with vomiting, unspecified: Secondary | ICD-10-CM | POA: Insufficient documentation

## 2015-06-29 DIAGNOSIS — Z86018 Personal history of other benign neoplasm: Secondary | ICD-10-CM | POA: Diagnosis not present

## 2015-06-29 DIAGNOSIS — K219 Gastro-esophageal reflux disease without esophagitis: Secondary | ICD-10-CM | POA: Diagnosis not present

## 2015-06-29 DIAGNOSIS — R079 Chest pain, unspecified: Secondary | ICD-10-CM | POA: Diagnosis not present

## 2015-06-29 DIAGNOSIS — R0602 Shortness of breath: Secondary | ICD-10-CM | POA: Diagnosis not present

## 2015-06-29 DIAGNOSIS — R197 Diarrhea, unspecified: Secondary | ICD-10-CM | POA: Insufficient documentation

## 2015-06-29 DIAGNOSIS — R1013 Epigastric pain: Secondary | ICD-10-CM | POA: Diagnosis not present

## 2015-06-29 DIAGNOSIS — Z3202 Encounter for pregnancy test, result negative: Secondary | ICD-10-CM | POA: Insufficient documentation

## 2015-06-29 LAB — CBC
HEMATOCRIT: 37.7 % (ref 36.0–46.0)
HEMOGLOBIN: 12.2 g/dL (ref 12.0–15.0)
MCH: 25.2 pg — ABNORMAL LOW (ref 26.0–34.0)
MCHC: 32.4 g/dL (ref 30.0–36.0)
MCV: 77.7 fL — ABNORMAL LOW (ref 78.0–100.0)
Platelets: 330 10*3/uL (ref 150–400)
RBC: 4.85 MIL/uL (ref 3.87–5.11)
RDW: 14.6 % (ref 11.5–15.5)
WBC: 6.5 10*3/uL (ref 4.0–10.5)

## 2015-06-29 LAB — BASIC METABOLIC PANEL
ANION GAP: 8 (ref 5–15)
BUN: 14 mg/dL (ref 6–20)
CO2: 23 mmol/L (ref 22–32)
Calcium: 9.3 mg/dL (ref 8.9–10.3)
Chloride: 103 mmol/L (ref 101–111)
Creatinine, Ser: 0.91 mg/dL (ref 0.44–1.00)
GFR calc Af Amer: >60 mL/min (ref >60)
Glucose, Bld: 110 mg/dL — ABNORMAL HIGH (ref 65–99)
POTASSIUM: 3.4 mmol/L — AB (ref 3.5–5.1)
Sodium: 134 mmol/L — ABNORMAL LOW (ref 135–145)

## 2015-06-29 LAB — I-STAT TROPONIN, ED: Troponin i, poc: 0 ng/mL (ref 0.00–0.08)

## 2015-06-29 MED ORDER — SODIUM CHLORIDE 0.9 % IV BOLUS (SEPSIS)
1000.0000 mL | Freq: Once | INTRAVENOUS | Status: AC
  Administered 2015-06-29: 1000 mL via INTRAVENOUS

## 2015-06-29 MED ORDER — PANTOPRAZOLE SODIUM 40 MG IV SOLR
40.0000 mg | INTRAVENOUS | Status: AC
  Administered 2015-06-29: 40 mg via INTRAVENOUS
  Filled 2015-06-29: qty 40

## 2015-06-29 MED ORDER — ACETAMINOPHEN 325 MG PO TABS
650.0000 mg | ORAL_TABLET | Freq: Once | ORAL | Status: AC
  Administered 2015-06-29: 650 mg via ORAL
  Filled 2015-06-29: qty 2

## 2015-06-29 MED ORDER — ONDANSETRON 4 MG PO TBDP
4.0000 mg | ORAL_TABLET | Freq: Once | ORAL | Status: AC
  Administered 2015-06-29: 4 mg via ORAL
  Filled 2015-06-29: qty 1

## 2015-06-29 MED ORDER — ONDANSETRON HCL 4 MG/2ML IJ SOLN
4.0000 mg | Freq: Once | INTRAMUSCULAR | Status: AC
  Administered 2015-06-29: 4 mg via INTRAVENOUS
  Filled 2015-06-29: qty 2

## 2015-06-29 NOTE — ED Notes (Signed)
Per PA, ok for pt to go back to lobby.

## 2015-06-29 NOTE — ED Provider Notes (Signed)
CSN: [id number]     Arrival date & time 06/29/15  1711 History   First MD Initiated Contact with Patient 06/29/15 1743     Chief Complaint  Patient presents with  . Chest Pain  . Nausea  . Diarrhea  . Emesis     (Consider location/radiation/quality/duration/timing/severity/associated sxs/prior Treatment) HPI   Pt is a 49 yo female with PMH of fibromyalgia and GERD who presents to the ED with complaint of nausea, diarrhea and CP. Patient reports having nausea and multiple episodes of nonbloody diarrhea that started at 10 PM yesterday after eating collared greens. She endorses having constant "sharp, stabbing, burning" periumbilical pain, denies any aggravating or alleviating factors. Denies fever, chills, nasal congestion, sore throat, vomiting, urinary sxs, vaginal bleeding/discharge. She reports other family members who also ate collard greens having similar symptoms however she reports her symptoms are much worse. Pt states she also began having midsternal "sharp, stabbing, burning" pain that is worse with taking a deep breath. She notes having intermittent episodes of SOB when her CP worsens. She notes she typically takes Nexium for her reflux but notes she has not taken it for the last 2 days. LMP 11/7. No history of abdominal surgeries.   Past Medical History  Diagnosis Date  . Fibromyalgia   . Acid reflux   . Uterine fibroid   . Disturbance of skin sensation    Past Surgical History  Procedure Laterality Date  . Carpal tunnel release    . Shoulder surgery    . Cystectomy     Family History  Problem Relation Age of Onset  . Hypertension Mother   . Diabetes Maternal Grandmother   . Arrhythmia Mother   . Thyroid disease Mother   . Kidney disease Sister   . Multiple sclerosis Cousin    Social History  Substance Use Topics  . Smoking status: Never Smoker   . Smokeless tobacco: Never Used  . Alcohol Use: No   OB History    No data available     Review of Systems   Respiratory: Positive for shortness of breath.   Cardiovascular: Positive for chest pain.  Gastrointestinal: Positive for nausea, abdominal pain and diarrhea.  All other systems reviewed and are negative.     Allergies  Review of patient's allergies indicates no known allergies.  Home Medications   Prior to Admission medications   Medication Sig Start Date End Date Taking? Authorizing Provider  albuterol (PROVENTIL HFA;VENTOLIN HFA) 108 (90 BASE) MCG/ACT inhaler Inhale 2 puffs into the lungs every 6 (six) hours as needed for wheezing or shortness of breath. 10/24/14  Yes Gregor Hams, MD  amitriptyline (ELAVIL) 25 MG tablet Take 25 mg by mouth at bedtime. 04/23/15  Yes Historical Provider, MD  citalopram (CELEXA) 10 MG tablet Take 1 tablet (10 mg total) by mouth daily. 05/03/15  Yes Marcial Pacas, MD  cyclobenzaprine (FLEXERIL) 10 MG tablet Take 10 mg by mouth 3 (three) times daily as needed for muscle spasms.   Yes Historical Provider, MD  diclofenac (VOLTAREN) 75 MG EC tablet Take 75 mg by mouth 2 (two) times daily.   Yes Historical Provider, MD  esomeprazole (NEXIUM) 40 MG capsule Take 40 mg by mouth daily before breakfast.   Yes Historical Provider, MD  fluticasone (FLONASE) 50 MCG/ACT nasal spray Place 2 sprays into both nostrils daily. 10/24/14  Yes Gregor Hams, MD  HYDROcodone-acetaminophen (NORCO) 10-325 MG per tablet Take 1 tablet by mouth every 6 (six) hours as needed (pain).  Yes Historical Provider, MD  ibuprofen (ADVIL,MOTRIN) 800 MG tablet Take 800 mg by mouth daily as needed (pain.).   Yes Historical Provider, MD  pregabalin (LYRICA) 150 MG capsule Take 150 mg by mouth 2 (two) times daily.    Yes Historical Provider, MD  sucralfate (CARAFATE) 1 G tablet Take 1 tablet (1 g total) by mouth 4 (four) times daily. Patient taking differently: Take 1 g by mouth daily.  07/11/12  Yes Malvin Johns, MD  zonisamide (ZONEGRAN) 25 MG capsule Take 75 mg by mouth at bedtime.    Yes Historical  Provider, MD  ondansetron (ZOFRAN ODT) 4 MG disintegrating tablet Take 1 tablet (4 mg total) by mouth every 8 (eight) hours as needed for nausea or vomiting. 06/30/15   Nona Dell, PA-C  Spacer/Aero-Holding Chambers (AEROCHAMBER PLUS FLO-VU LARGE) MISC 1 each by Other route once. 09/23/13   Freeman Caldron Baker, PA-C   BP 120/77 mmHg  Pulse 72  Resp 18  SpO2 100%  LMP 06/11/2015 Physical Exam  Constitutional: She is oriented to person, place, and time. She appears well-developed and well-nourished.  HENT:  Head: Normocephalic and atraumatic.  Mouth/Throat: Oropharynx is clear and moist.  Eyes: Conjunctivae and EOM are normal. Right eye exhibits no discharge. Left eye exhibits no discharge. No scleral icterus.  Neck: Normal range of motion. Neck supple.  Cardiovascular: Normal rate, regular rhythm, normal heart sounds and intact distal pulses.   Pulmonary/Chest: Effort normal and breath sounds normal. No respiratory distress. She has no wheezes. She has no rales. She exhibits no tenderness.  Abdominal: Soft. Bowel sounds are normal. She exhibits no distension and no mass. There is tenderness in the epigastric area. There is no rebound and no guarding.  Musculoskeletal: She exhibits no edema.  Lymphadenopathy:    She has no cervical adenopathy.  Neurological: She is alert and oriented to person, place, and time.  Skin: Skin is warm and dry.  Nursing note and vitals reviewed.   ED Course  Procedures (including critical care time) Labs Review Labs Reviewed  BASIC METABOLIC PANEL - Abnormal; Notable for the following:    Sodium 134 (*)    Potassium 3.4 (*)    Glucose, Bld 110 (*)    All other components within normal limits  CBC - Abnormal; Notable for the following:    MCV 77.7 (*)    MCH 25.2 (*)    All other components within normal limits  URINALYSIS, ROUTINE W REFLEX MICROSCOPIC (NOT AT [facility]) - Abnormal; Notable for the following:    APPearance CLOUDY (*)    All other  components within normal limits  PREGNANCY, URINE  I-STAT TROPOININ, ED    Imaging Review Dg Chest 2 View  06/29/2015  CLINICAL DATA:  Mid chest pain 2 days with shortness of breath and nausea, vomiting and diarrhea. EXAM: CHEST  2 VIEW COMPARISON:  10/24/2014 FINDINGS: The heart size and mediastinal contours are within normal limits. Both lungs are clear. The visualized skeletal structures are unremarkable. IMPRESSION: No active cardiopulmonary disease. Electronically Signed   By: Marin Olp M.D.   On: 06/29/2015 18:35   I have personally reviewed and evaluated these images and lab results as part of my medical decision-making.   EKG Interpretation   Date/Time:  Friday June 29 2015 17:25:35 EST Ventricular Rate:  86 PR Interval:  137 QRS Duration: 98 QT Interval:  365 QTC Calculation: 436 R Axis:   64 Text Interpretation:  Sinus rhythm Biatrial enlargement RSR' in V1  or V2,  probably normal variant No significant change since last tracing Confirmed  by LIU MD, DANA [phone]) on 06/29/2015 10:33:51 PM      MDM   Final diagnoses:  Nausea vomiting and diarrhea    Patient presents with nausea, vomiting, diarrhea and abdominal pain that started last night after eating collard greens. She also reports having midsternal chest pain that is worse with deep breathing. VSS. Exam revealed mild epigastric tenderness, no peritoneal signs. Cardiac exam unremarkable. EKG showed sinus rhythm, unchanged from prior. Chest x-ray negative, troponin negative. Pregnancy negative. Labs and urine unremarkable. Patient given IV fluids, antiemetic and PPI. Patient reports improvement of nausea but abdominal pain persists. Gi cocktail ordered. I suspect patient's symptoms are likely due to viral gastroenteritis do not feel that any further imaging is warranted at this time. I have a low suspicion for ACS, PE, dissection, or other acute cardiac event at this time. Patient able tolerate by mouth. Plan to  discharge patient home with antiemetics, discussed taking fluids to remain hydrated and eating a bland diet. Advised patient to follow up with her PCP on Monday.  Evaluation does not show pathology requring ongoing emergent intervention or admission. Pt is hemodynamically stable and mentating appropriately. Discussed findings/results and plan with patient/guardian, who agrees with plan. All questions answered. Return precautions discussed and outpatient follow up given.     Chesley Noon [place], [place] 06/30/15 [id number]  Forde Dandy, MD 06/30/15 [phone]

## 2015-06-29 NOTE — ED Notes (Signed)
Pt comes to Ed with c/o of chest pain of 7 out 10 x2 days. SOB on movement, and reports nausea , vomiting and diarrhea. Pain located in [employer] area. Assessment triage started.

## 2015-06-30 DIAGNOSIS — R112 Nausea with vomiting, unspecified: Secondary | ICD-10-CM | POA: Diagnosis not present

## 2015-06-30 LAB — URINALYSIS, ROUTINE W REFLEX MICROSCOPIC
Bilirubin Urine: NEGATIVE
Glucose, UA: NEGATIVE mg/dL
HGB URINE DIPSTICK: NEGATIVE
KETONES UR: NEGATIVE mg/dL
LEUKOCYTES UA: NEGATIVE
Nitrite: NEGATIVE
PROTEIN: NEGATIVE mg/dL
Specific Gravity, Urine: 1.025 (ref 1.005–1.030)
pH: 5.5 (ref 5.0–8.0)

## 2015-06-30 LAB — PREGNANCY, URINE: Preg Test, Ur: NEGATIVE

## 2015-06-30 MED ORDER — ONDANSETRON 4 MG PO TBDP: 4.0000 mg | ORAL_TABLET | Freq: Three times a day (TID) | ORAL | Status: DC | PRN

## 2015-06-30 MED ORDER — GI COCKTAIL ~~LOC~~
30.0000 mL | Freq: Once | ORAL | Status: AC
  Administered 2015-06-30: 30 mL via ORAL
  Filled 2015-06-30: qty 30

## 2015-06-30 NOTE — Discharge Instructions (Signed)
Take your medications as prescribed as needed for pain relief. Continue drinking fluids to remain hydrated to eat a bland diet for the next 2-3 days until symptoms improve. Follow up with your primary care provider in 3 days. Please return to the Emergency Department if symptoms worsen or new onset of fever, vomiting blood, bloody stools.

## 2015-07-10 ENCOUNTER — Ambulatory Visit (INDEPENDENT_AMBULATORY_CARE_PROVIDER_SITE_OTHER): Payer: Medicare Other | Admitting: Neurology

## 2015-07-10 ENCOUNTER — Ambulatory Visit (INDEPENDENT_AMBULATORY_CARE_PROVIDER_SITE_OTHER): Payer: Self-pay | Admitting: Neurology

## 2015-07-10 DIAGNOSIS — M79671 Pain in right foot: Secondary | ICD-10-CM

## 2015-07-10 DIAGNOSIS — R202 Paresthesia of skin: Secondary | ICD-10-CM | POA: Diagnosis not present

## 2015-07-10 DIAGNOSIS — M79672 Pain in left foot: Secondary | ICD-10-CM

## 2015-07-10 DIAGNOSIS — Z0289 Encounter for other administrative examinations: Secondary | ICD-10-CM

## 2015-07-10 DIAGNOSIS — M545 Low back pain: Secondary | ICD-10-CM

## 2015-07-10 NOTE — Procedures (Signed)
   NCS (NERVE CONDUCTION STUDY) WITH EMG (ELECTROMYOGRAPHY) REPORT   STUDY DATE: July 10 2015 PATIENT NAME: Kerry Ortiz DOB: Aug 06, 1965 MRN: [id number]    TECHNOLOGIST: Laretta Alstrom ELECTROMYOGRAPHER: Marcial Pacas M.D.  CLINICAL INFORMATION:  50 years old female, with chronic low back pain, diffuse body achy pain, recent worsening bilateral feet pain  FINDINGS: NERVE CONDUCTION STUDY: Bilateral peroneal sensory responses were normal. Bilateral peroneal to EDB, tibial motor responses were normal. The lateral tibial H reflexes were normal and symmetric.  NEEDLE ELECTROMYOGRAPHY: Selective needle examinations were performed at bilateral lower extremity muscles, bilateral lumbar sacral paraspinal muscles.  Needle examination of bilateral tibialis anterior, tibialis posterior, vastus lateralis, biceps femoris long head was normal.  There was no spontaneous activity at bilateral lumbosacral paraspinal muscles, bilateral L4-5 S1.  IMPRESSION:  This was a normal study. There was no electrodiagnostic evidence of large fiber peripheral neuropathy, inflammatory myopathy, or bilateral lumbosacral radiculopathy   INTERPRETING PHYSICIAN:   Marcial Pacas M.D. Ph.D. [facility] [address] [place], [place] [address] [phone]

## 2015-07-10 NOTE — Progress Notes (Signed)
49 years old female with chronic low back pain diffuse body aching pain bilateral lower extremity pain,  Today's electrodiagnostic studies normal, there was no evidence of large fiber peripheral neuropathy, myopathy, lumbosacral radiculopathy,

## 2015-09-29 ENCOUNTER — Emergency Department (HOSPITAL_COMMUNITY): Payer: Medicare Other

## 2015-09-29 ENCOUNTER — Emergency Department (HOSPITAL_COMMUNITY)
Admission: EM | Admit: 2015-09-29 | Discharge: 2015-09-29 | Disposition: A | Discharge status: Home / Self Care | Payer: Medicare Other | Attending: Emergency Medicine | Admitting: Emergency Medicine

## 2015-09-29 ENCOUNTER — Encounter (HOSPITAL_COMMUNITY): Payer: Self-pay | Admitting: Oncology

## 2015-09-29 DIAGNOSIS — Z86018 Personal history of other benign neoplasm: Secondary | ICD-10-CM | POA: Diagnosis not present

## 2015-09-29 DIAGNOSIS — X58XXXA Exposure to other specified factors, initial encounter: Secondary | ICD-10-CM | POA: Insufficient documentation

## 2015-09-29 DIAGNOSIS — M797 Fibromyalgia: Secondary | ICD-10-CM | POA: Insufficient documentation

## 2015-09-29 DIAGNOSIS — Z79899 Other long term (current) drug therapy: Secondary | ICD-10-CM | POA: Diagnosis not present

## 2015-09-29 DIAGNOSIS — S63114A Dislocation of metacarpophalangeal joint of right thumb, initial encounter: Secondary | ICD-10-CM | POA: Diagnosis not present

## 2015-09-29 DIAGNOSIS — Z791 Long term (current) use of non-steroidal anti-inflammatories (NSAID): Secondary | ICD-10-CM | POA: Diagnosis not present

## 2015-09-29 DIAGNOSIS — Y9389 Activity, other specified: Secondary | ICD-10-CM | POA: Insufficient documentation

## 2015-09-29 DIAGNOSIS — S63269A Dislocation of metacarpophalangeal joint of unspecified finger, initial encounter: Secondary | ICD-10-CM

## 2015-09-29 DIAGNOSIS — Z7951 Long term (current) use of inhaled steroids: Secondary | ICD-10-CM | POA: Diagnosis not present

## 2015-09-29 DIAGNOSIS — Y998 Other external cause status: Secondary | ICD-10-CM | POA: Diagnosis not present

## 2015-09-29 DIAGNOSIS — K219 Gastro-esophageal reflux disease without esophagitis: Secondary | ICD-10-CM | POA: Diagnosis not present

## 2015-09-29 DIAGNOSIS — S6991XA Unspecified injury of right wrist, hand and finger(s), initial encounter: Secondary | ICD-10-CM | POA: Diagnosis present

## 2015-09-29 DIAGNOSIS — Y9289 Other specified places as the place of occurrence of the external cause: Secondary | ICD-10-CM | POA: Diagnosis not present

## 2015-09-29 MED ORDER — IBUPROFEN 800 MG PO TABS: 800.0000 mg | ORAL_TABLET | Freq: Three times a day (TID) | ORAL | Status: DC | PRN

## 2015-09-29 MED ORDER — LIDOCAINE HCL 2 % IJ SOLN
10.0000 mL | Freq: Once | INTRAMUSCULAR | Status: AC
  Administered 2015-09-29: 200 mg
  Filled 2015-09-29: qty 20

## 2015-09-29 MED ORDER — OXYCODONE-ACETAMINOPHEN 5-325 MG PO TABS
2.0000 | ORAL_TABLET | Freq: Once | ORAL | Status: AC
  Administered 2015-09-29: 2 via ORAL
  Filled 2015-09-29: qty 2

## 2015-09-29 NOTE — Discharge Instructions (Signed)
Call Dr. Biagio Borg office on Monday to schedule a follow-up appointment. Keep your splint on unless otherwise instructed by Dr. Grandville Silos. Take ibuprofen as needed for pain. Ice or thumb 3-4 times per day for 15-20 minutes each time. Return to the ED as needed if symptoms worsen.  Finger Dislocation Finger dislocation is the displacement of bones in your finger at the joints. Most commonly, finger dislocation occurs at the proximal interphalangeal joint (the joint closest to your knuckle). Very strong, fibrous tissues (ligaments) and joint capsules connect the three bones of your fingers.  CAUSES Dislocation is caused by a forceful impact. This impact moves these bones off the joint and often tears your ligaments.  SYMPTOMS Symptoms of finger dislocation include:  Deformity of your finger.  Pain, with loss of movement. DIAGNOSIS  Finger dislocation is diagnosed with a physical exam. Often, X-ray exams are done to see if you have associated injuries, such as bone fractures. TREATMENT  Finger dislocations are treated by putting your bones back into position (reduction) either by manually moving the bones back into place or through surgery. Your finger is then kept in a fixed position (immobilized) with the use of a dressing or splint for a brief period. When your ligament has to be surgically repaired, it needs to be kept in a fixed position with a dressing or splint for 1 to 2 weeks. Because joint stiffness is a long-term complication of finger dislocation, hand exercises or physical therapy to increase the range of motion and to regain strength is usually started as soon as the ligament is healed. Exercises and therapy generally last no more than 3 months. HOME CARE INSTRUCTIONS The following measures can help to reduce pain and speed up the healing process:  Rest your injured joint. Do not move until instructed otherwise by your caregiver. Avoid activities similar to the one that caused your  injury.  Apply ice to your injured joint for the first day or 2 after your reduction or as directed by your caregiver. Applying ice helps to reduce inflammation and pain.  Put ice in a plastic bag.  Place a towel between your skin and the bag.  Leave the ice on for 15-20 minutes at a time, every 2 hours while you are awake.  Elevate your hand above your heart as directed by your caregiver to reduce swelling.  Take over-the-counter or prescription medicine for pain as your caregiver instructs you. SEEK IMMEDIATE MEDICAL CARE IF:  Your dressing or splint becomes damaged.  Your pain becomes worse rather than better.  You lose feeling in your finger, or it becomes cold and white. MAKE SURE YOU:  Understand these instructions.  Will watch your condition.  Will get help right away if you are not doing well or get worse.   This information is not intended to replace advice given to you by your health care provider. Make sure you discuss any questions you have with your health care provider.   Document Released: 07/18/2000 Document Revised: 08/11/2014 Document Reviewed: 12/15/2014 Elsevier Interactive Patient Education [employer].
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
## 2015-10-03 DIAGNOSIS — M79642 Pain in left hand: Secondary | ICD-10-CM | POA: Diagnosis not present

## 2015-10-03 DIAGNOSIS — M797 Fibromyalgia: Secondary | ICD-10-CM | POA: Diagnosis not present

## 2015-10-03 DIAGNOSIS — M62838 Other muscle spasm: Secondary | ICD-10-CM | POA: Diagnosis not present

## 2015-10-03 DIAGNOSIS — K219 Gastro-esophageal reflux disease without esophagitis: Secondary | ICD-10-CM | POA: Diagnosis not present

## 2015-10-04 DIAGNOSIS — S63641A Sprain of metacarpophalangeal joint of right thumb, initial encounter: Secondary | ICD-10-CM | POA: Diagnosis not present

## 2015-10-08 ENCOUNTER — Ambulatory Visit: Payer: Medicare Other | Admitting: Adult Health

## 2015-10-09 ENCOUNTER — Ambulatory Visit (INDEPENDENT_AMBULATORY_CARE_PROVIDER_SITE_OTHER): Payer: Medicare Other | Admitting: Adult Health

## 2015-10-09 ENCOUNTER — Encounter: Payer: Self-pay | Admitting: Adult Health

## 2015-10-09 VITALS — BP 101/64 | HR 71 | Ht 62.0 in

## 2015-10-09 DIAGNOSIS — R202 Paresthesia of skin: Secondary | ICD-10-CM | POA: Diagnosis not present

## 2015-10-09 DIAGNOSIS — M797 Fibromyalgia: Secondary | ICD-10-CM

## 2015-10-09 NOTE — Patient Instructions (Signed)
Try Compounded Cream  For now continue Celexa If your symptoms worsen or you develop new symptoms please let us know.
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
## 2015-10-09 NOTE — Progress Notes (Signed)
I have reviewed and agreed above plan. 

## 2015-10-17 DIAGNOSIS — E7800 Pure hypercholesterolemia, unspecified: Secondary | ICD-10-CM | POA: Diagnosis not present

## 2015-10-17 DIAGNOSIS — Z79899 Other long term (current) drug therapy: Secondary | ICD-10-CM | POA: Diagnosis not present

## 2015-10-17 DIAGNOSIS — R7309 Other abnormal glucose: Secondary | ICD-10-CM | POA: Diagnosis not present

## 2015-10-17 DIAGNOSIS — M797 Fibromyalgia: Secondary | ICD-10-CM | POA: Diagnosis not present

## 2015-10-19 DIAGNOSIS — S63681A Other sprain of right thumb, initial encounter: Secondary | ICD-10-CM | POA: Diagnosis not present

## 2015-10-19 DIAGNOSIS — S63641A Sprain of metacarpophalangeal joint of right thumb, initial encounter: Secondary | ICD-10-CM | POA: Diagnosis not present

## 2015-10-19 DIAGNOSIS — Y939 Activity, unspecified: Secondary | ICD-10-CM | POA: Diagnosis not present

## 2015-10-19 DIAGNOSIS — Y999 Unspecified external cause status: Secondary | ICD-10-CM | POA: Diagnosis not present

## 2015-10-19 DIAGNOSIS — Y929 Unspecified place or not applicable: Secondary | ICD-10-CM | POA: Diagnosis not present

## 2015-10-19 DIAGNOSIS — X58XXXA Exposure to other specified factors, initial encounter: Secondary | ICD-10-CM | POA: Diagnosis not present

## 2015-10-19 DIAGNOSIS — G8918 Other acute postprocedural pain: Secondary | ICD-10-CM | POA: Diagnosis not present

## 2015-10-24 DIAGNOSIS — M79604 Pain in right leg: Secondary | ICD-10-CM | POA: Diagnosis not present

## 2015-10-24 DIAGNOSIS — M79605 Pain in left leg: Secondary | ICD-10-CM | POA: Diagnosis not present

## 2015-11-01 DIAGNOSIS — S63641D Sprain of metacarpophalangeal joint of right thumb, subsequent encounter: Secondary | ICD-10-CM | POA: Diagnosis not present

## 2015-11-20 DIAGNOSIS — M797 Fibromyalgia: Secondary | ICD-10-CM | POA: Diagnosis not present

## 2015-11-20 DIAGNOSIS — E7800 Pure hypercholesterolemia, unspecified: Secondary | ICD-10-CM | POA: Diagnosis not present

## 2015-11-20 DIAGNOSIS — Z0000 Encounter for general adult medical examination without abnormal findings: Secondary | ICD-10-CM | POA: Diagnosis not present

## 2015-11-20 DIAGNOSIS — J029 Acute pharyngitis, unspecified: Secondary | ICD-10-CM | POA: Diagnosis not present

## 2015-11-29 DIAGNOSIS — S63641A Sprain of metacarpophalangeal joint of right thumb, initial encounter: Secondary | ICD-10-CM | POA: Diagnosis not present

## 2015-12-04 DIAGNOSIS — M79641 Pain in right hand: Secondary | ICD-10-CM | POA: Diagnosis not present

## 2015-12-06 DIAGNOSIS — M24541 Contracture, right hand: Secondary | ICD-10-CM | POA: Diagnosis not present

## 2015-12-06 DIAGNOSIS — M25441 Effusion, right hand: Secondary | ICD-10-CM | POA: Diagnosis not present

## 2015-12-06 DIAGNOSIS — M79641 Pain in right hand: Secondary | ICD-10-CM | POA: Diagnosis not present

## 2015-12-06 DIAGNOSIS — M25641 Stiffness of right hand, not elsewhere classified: Secondary | ICD-10-CM | POA: Diagnosis not present

## 2015-12-11 DIAGNOSIS — M25641 Stiffness of right hand, not elsewhere classified: Secondary | ICD-10-CM | POA: Diagnosis not present

## 2015-12-11 DIAGNOSIS — M25441 Effusion, right hand: Secondary | ICD-10-CM | POA: Diagnosis not present

## 2015-12-11 DIAGNOSIS — M79641 Pain in right hand: Secondary | ICD-10-CM | POA: Diagnosis not present

## 2015-12-11 DIAGNOSIS — M24541 Contracture, right hand: Secondary | ICD-10-CM | POA: Diagnosis not present

## 2015-12-13 DIAGNOSIS — M79641 Pain in right hand: Secondary | ICD-10-CM | POA: Diagnosis not present

## 2015-12-13 DIAGNOSIS — M25441 Effusion, right hand: Secondary | ICD-10-CM | POA: Diagnosis not present

## 2015-12-13 DIAGNOSIS — M24541 Contracture, right hand: Secondary | ICD-10-CM | POA: Diagnosis not present

## 2015-12-13 DIAGNOSIS — M25641 Stiffness of right hand, not elsewhere classified: Secondary | ICD-10-CM | POA: Diagnosis not present

## 2015-12-18 DIAGNOSIS — M79641 Pain in right hand: Secondary | ICD-10-CM | POA: Diagnosis not present

## 2015-12-18 DIAGNOSIS — M25441 Effusion, right hand: Secondary | ICD-10-CM | POA: Diagnosis not present

## 2015-12-18 DIAGNOSIS — M24541 Contracture, right hand: Secondary | ICD-10-CM | POA: Diagnosis not present

## 2015-12-18 DIAGNOSIS — M25641 Stiffness of right hand, not elsewhere classified: Secondary | ICD-10-CM | POA: Diagnosis not present

## 2015-12-25 DIAGNOSIS — M25641 Stiffness of right hand, not elsewhere classified: Secondary | ICD-10-CM | POA: Diagnosis not present

## 2015-12-25 DIAGNOSIS — M24541 Contracture, right hand: Secondary | ICD-10-CM | POA: Diagnosis not present

## 2015-12-25 DIAGNOSIS — M25441 Effusion, right hand: Secondary | ICD-10-CM | POA: Diagnosis not present

## 2015-12-25 DIAGNOSIS — M79641 Pain in right hand: Secondary | ICD-10-CM | POA: Diagnosis not present

## 2016-01-02 DIAGNOSIS — M25441 Effusion, right hand: Secondary | ICD-10-CM | POA: Diagnosis not present

## 2016-01-02 DIAGNOSIS — M25641 Stiffness of right hand, not elsewhere classified: Secondary | ICD-10-CM | POA: Diagnosis not present

## 2016-01-02 DIAGNOSIS — M79641 Pain in right hand: Secondary | ICD-10-CM | POA: Diagnosis not present

## 2016-01-02 DIAGNOSIS — S63641D Sprain of metacarpophalangeal joint of right thumb, subsequent encounter: Secondary | ICD-10-CM | POA: Diagnosis not present

## 2016-01-02 DIAGNOSIS — M24541 Contracture, right hand: Secondary | ICD-10-CM | POA: Diagnosis not present

## 2016-01-15 DIAGNOSIS — M25441 Effusion, right hand: Secondary | ICD-10-CM | POA: Diagnosis not present

## 2016-01-15 DIAGNOSIS — M25641 Stiffness of right hand, not elsewhere classified: Secondary | ICD-10-CM | POA: Diagnosis not present

## 2016-01-15 DIAGNOSIS — M24541 Contracture, right hand: Secondary | ICD-10-CM | POA: Diagnosis not present

## 2016-01-15 DIAGNOSIS — M79641 Pain in right hand: Secondary | ICD-10-CM | POA: Diagnosis not present

## 2016-01-23 DIAGNOSIS — M25441 Effusion, right hand: Secondary | ICD-10-CM | POA: Diagnosis not present

## 2016-01-23 DIAGNOSIS — M24541 Contracture, right hand: Secondary | ICD-10-CM | POA: Diagnosis not present

## 2016-01-23 DIAGNOSIS — M79641 Pain in right hand: Secondary | ICD-10-CM | POA: Diagnosis not present

## 2016-01-23 DIAGNOSIS — M25641 Stiffness of right hand, not elsewhere classified: Secondary | ICD-10-CM | POA: Diagnosis not present

## 2016-01-29 DIAGNOSIS — M25641 Stiffness of right hand, not elsewhere classified: Secondary | ICD-10-CM | POA: Diagnosis not present

## 2016-01-29 DIAGNOSIS — M25441 Effusion, right hand: Secondary | ICD-10-CM | POA: Diagnosis not present

## 2016-01-29 DIAGNOSIS — M24541 Contracture, right hand: Secondary | ICD-10-CM | POA: Diagnosis not present

## 2016-01-29 DIAGNOSIS — M79641 Pain in right hand: Secondary | ICD-10-CM | POA: Diagnosis not present

## 2016-01-31 DIAGNOSIS — M25641 Stiffness of right hand, not elsewhere classified: Secondary | ICD-10-CM | POA: Diagnosis not present

## 2016-01-31 DIAGNOSIS — M79641 Pain in right hand: Secondary | ICD-10-CM | POA: Diagnosis not present

## 2016-01-31 DIAGNOSIS — M25441 Effusion, right hand: Secondary | ICD-10-CM | POA: Diagnosis not present

## 2016-01-31 DIAGNOSIS — M24541 Contracture, right hand: Secondary | ICD-10-CM | POA: Diagnosis not present

## 2016-02-07 ENCOUNTER — Encounter (INDEPENDENT_AMBULATORY_CARE_PROVIDER_SITE_OTHER): Payer: Self-pay

## 2016-02-07 ENCOUNTER — Ambulatory Visit (INDEPENDENT_AMBULATORY_CARE_PROVIDER_SITE_OTHER): Payer: Medicare Other | Admitting: Neurology

## 2016-02-07 ENCOUNTER — Encounter: Payer: Self-pay | Admitting: Neurology

## 2016-02-07 VITALS — BP 106/64 | HR 62 | Ht 62.0 in | Wt 112.2 lb

## 2016-02-07 DIAGNOSIS — M25641 Stiffness of right hand, not elsewhere classified: Secondary | ICD-10-CM | POA: Diagnosis not present

## 2016-02-07 DIAGNOSIS — R202 Paresthesia of skin: Secondary | ICD-10-CM

## 2016-02-07 DIAGNOSIS — M24541 Contracture, right hand: Secondary | ICD-10-CM | POA: Diagnosis not present

## 2016-02-07 DIAGNOSIS — M25441 Effusion, right hand: Secondary | ICD-10-CM | POA: Diagnosis not present

## 2016-02-07 DIAGNOSIS — M79641 Pain in right hand: Secondary | ICD-10-CM | POA: Diagnosis not present

## 2016-02-07 NOTE — Progress Notes (Signed)
Chief Complaint  Patient presents with  . Back Pain    She is still having back pain (does not radiate into legs).  Feels her current medication regimen is keeping her pain tolerable.  . Peripheral Neuropathy    The burning, tingling pain in her bilateral feet are still bothersome.  Symptoms are disruptive to her sleep.        PATIENT: Kerry Ortiz DOB: 03-16-1966  HISTORY OF PRESENT ILLNESS: HISTORY 05/03/15: Kerry Ortiz is a 50 years old right-handed female, seen in refer by her primary care physician Dr. Glendale Chard in May 03 2015 for evaluation of bilateral feet and lower extremity achy pain  I have reviewed and summarized office note, she had a history of fibromyalgia, carpal tunnel release surgery in the past, chronic migraines, she went on disability since 2008, complains of diffuse body achy pain, on polypharmacy treatment, including Elavil, Lyrica, Zonegran, She continue complains of depression symptoms, tearful during today's interview  Since 2012, she began to notice bilateral feet cold burning sensation, involving bottom and top of her feet, gradually trending up to knee level, getting worse with pressure, barium weight, she has chronic low back and neck pain, denies radiating pain, gait difficulty due to pain, no bowel and bladder incontinence, no bilateral upper extremity paresthesia or weakness,  She complains of feeling tired all the time, lack of interest, I have reviewed laboratory in July 2016, A1c was elevated 5.9, normal B12 567, TSH 1.4, CMP, she had more laboratory evaluation in August 2016  UPDATE July 6th 2017: Symptoms overall has much improved, she is taking Lyrica 150 mg twice a day, Celexa 10 mg daily, she still has intermittent bilateral feet paresthesia, no significant pain,I reviewed EMG nerve conduction study in December 2016, there is no evidence of large fiber peripheral neuropathy  I also reviewed laboratory evaluation in November 2016:normal  CBC, mildly low potassium on BMP 3.4  REVIEW OF SYSTEMS: Out of a complete 14 system review of symptoms, the patient complains only of the following symptoms, and all other reviewed systems are negative. Chill, fatigue, shortness of breath, choking, chest tightness, chest pain, leg swelling, cold intolerance, abdominal pain, rectal bleeding, restless leg, insomnia, frequent awakening, daytime sleepiness, sleep talking, joint pain, joint swelling, back pain, achy muscles, muscle cramps, walking difficulty, bruise easily, anemia, dizziness, headaches, weakness, depression  ALLERGIES: No Known Allergies  HOME MEDICATIONS: Outpatient Prescriptions Prior to Visit  Medication Sig Dispense Refill  . albuterol (PROVENTIL HFA;VENTOLIN HFA) 108 (90 BASE) MCG/ACT inhaler Inhale 2 puffs into the lungs every 6 (six) hours as needed for wheezing or shortness of breath. 1 Inhaler 2  . amitriptyline (ELAVIL) 25 MG tablet Take 25 mg by mouth at bedtime.    . citalopram (CELEXA) 10 MG tablet Take 1 tablet (10 mg total) by mouth daily. 30 tablet 6  . cyclobenzaprine (FLEXERIL) 10 MG tablet Take 10 mg by mouth 3 (three) times daily as needed for muscle spasms.    . diclofenac (VOLTAREN) 75 MG EC tablet Take 75 mg by mouth 2 (two) times daily.    Marland Kitchen esomeprazole (NEXIUM) 40 MG capsule Take 40 mg by mouth daily before breakfast.    . fluticasone (FLONASE) 50 MCG/ACT nasal spray Place 2 sprays into both nostrils daily. 16 g 2  . HYDROcodone-acetaminophen (NORCO) 10-325 MG per tablet Take 1 tablet by mouth every 6 (six) hours as needed (pain).    Marland Kitchen ibuprofen (ADVIL,MOTRIN) 800 MG tablet Take 1 tablet (800 mg  total) by mouth every 8 (eight) hours as needed for mild pain or moderate pain (pain.). 30 tablet 0  . pregabalin (LYRICA) 150 MG capsule Take 150 mg by mouth 2 (two) times daily.     Marland Kitchen Spacer/Aero-Holding Chambers (AEROCHAMBER PLUS FLO-VU LARGE) MISC 1 each by Other route once. 1 each 0  . sucralfate (CARAFATE) 1  G tablet Take 1 tablet (1 g total) by mouth 4 (four) times daily. (Patient taking differently: Take 1 g by mouth daily. ) 20 tablet 0  . zonisamide (ZONEGRAN) 25 MG capsule Take 75 mg by mouth at bedtime.     Marland Kitchen HYDROcodone-acetaminophen (NORCO/VICODIN) 5-325 MG tablet Take 1 tablet by mouth every 4 (four) hours as needed.  0   No facility-administered medications prior to visit.    PAST MEDICAL HISTORY: Past Medical History  Diagnosis Date  . Fibromyalgia   . Acid reflux   . Uterine fibroid   . Disturbance of skin sensation     PAST SURGICAL HISTORY: Past Surgical History  Procedure Laterality Date  . Carpal tunnel release    . Shoulder surgery    . Cystectomy      FAMILY HISTORY: Family History  Problem Relation Age of Onset  . Hypertension Mother   . Diabetes Maternal Grandmother   . Arrhythmia Mother   . Thyroid disease Mother   . Kidney disease Sister   . Multiple sclerosis Cousin     SOCIAL HISTORY: Social History   Social History  . Marital Status: Single    Spouse Name: N/A  . Number of Children: 1  . Years of Education: HS   Occupational History  . Disabled    Social History Main Topics  . Smoking status: Never Smoker   . Smokeless tobacco: Never Used  . Alcohol Use: No  . Drug Use: No  . Sexual Activity: Not Currently    Birth Control/ Protection: None   Other Topics Concern  . Not on file   Social History Narrative   Lives at home alone.   Right-handed.   No caffeine use.      PHYSICAL EXAM  Filed Vitals:   02/07/16 1017  BP: 106/64  Pulse: 62  Height: 5\' 2"  (1.575 m)  Weight: 112 lb 4 oz (50.916 kg)   Body mass index is 20.53 kg/(m^2).  Generalized: Well developed, in no acute distress   Neurological examination  Mentation: Alert oriented to time, place, history taking. Follows all commands speech and language fluent Cranial nerve II-XII: Pupils were equal round reactive to light. Extraocular movements were full, visual field  were full on confrontational test. Facial sensation and strength were normal. Uvula tongue midline. Head turning and shoulder shrug  were normal and symmetric. Motor: The motor testing reveals 5 over 5 strength of all 4 extremities. Good symmetric motor tone is noted throughout. She has a soft cast on the right hand. Sensory: Sensory testing is intact to soft touch on all 4 extremities. No evidence of extinction is noted.  Coordination: Cerebellar testing reveals good finger-nose-finger and heel-to-shin bilaterally.  Gait and station: Gait is normal. Tandem gait is normal. Romberg is negative. No drift is seen.  Reflexes: Deep tendon reflexes are symmetric and normal bilaterally.   DIAGNOSTIC DATA (LABS, IMAGING, TESTING) - I reviewed patient records, labs, notes, testing and imaging myself where available.   ASSESSMENT AND PLAN 50 y.o. year old female   Paresthesia  No evidence of large fiber peripheral neuropathy  Mild elevated A1c 5.9,  symptoms are suggestive of small fiber neuropathy  No other treatable cause identified  Continue Lyrica150 mg twice a day, Celexa 10 mg a day  Only return to clinic for new issues,  Marcial Pacas, M.D. Ph.D.  [facility] [place], [place] [address] Phone: [phone] Fax:      [phone]

## 2016-02-11 DIAGNOSIS — M1811 Unilateral primary osteoarthritis of first carpometacarpal joint, right hand: Secondary | ICD-10-CM | POA: Diagnosis not present

## 2016-02-21 DIAGNOSIS — R7309 Other abnormal glucose: Secondary | ICD-10-CM | POA: Diagnosis not present

## 2016-02-21 DIAGNOSIS — R079 Chest pain, unspecified: Secondary | ICD-10-CM | POA: Diagnosis not present

## 2016-03-03 DIAGNOSIS — R0789 Other chest pain: Secondary | ICD-10-CM | POA: Diagnosis not present

## 2016-03-04 DIAGNOSIS — M25641 Stiffness of right hand, not elsewhere classified: Secondary | ICD-10-CM | POA: Diagnosis not present

## 2016-03-04 DIAGNOSIS — M79641 Pain in right hand: Secondary | ICD-10-CM | POA: Diagnosis not present

## 2016-03-04 DIAGNOSIS — M25441 Effusion, right hand: Secondary | ICD-10-CM | POA: Diagnosis not present

## 2016-03-04 DIAGNOSIS — M24541 Contracture, right hand: Secondary | ICD-10-CM | POA: Diagnosis not present

## 2016-03-06 DIAGNOSIS — M24541 Contracture, right hand: Secondary | ICD-10-CM | POA: Diagnosis not present

## 2016-03-06 DIAGNOSIS — M25641 Stiffness of right hand, not elsewhere classified: Secondary | ICD-10-CM | POA: Diagnosis not present

## 2016-03-06 DIAGNOSIS — M25441 Effusion, right hand: Secondary | ICD-10-CM | POA: Diagnosis not present

## 2016-03-06 DIAGNOSIS — M79641 Pain in right hand: Secondary | ICD-10-CM | POA: Diagnosis not present

## 2016-03-11 DIAGNOSIS — M25441 Effusion, right hand: Secondary | ICD-10-CM | POA: Diagnosis not present

## 2016-03-11 DIAGNOSIS — M24541 Contracture, right hand: Secondary | ICD-10-CM | POA: Diagnosis not present

## 2016-03-11 DIAGNOSIS — M79641 Pain in right hand: Secondary | ICD-10-CM | POA: Diagnosis not present

## 2016-03-11 DIAGNOSIS — M25641 Stiffness of right hand, not elsewhere classified: Secondary | ICD-10-CM | POA: Diagnosis not present

## 2016-03-12 DIAGNOSIS — S63641A Sprain of metacarpophalangeal joint of right thumb, initial encounter: Secondary | ICD-10-CM | POA: Diagnosis not present

## 2016-03-12 DIAGNOSIS — M1811 Unilateral primary osteoarthritis of first carpometacarpal joint, right hand: Secondary | ICD-10-CM | POA: Diagnosis not present

## 2016-03-18 DIAGNOSIS — M25641 Stiffness of right hand, not elsewhere classified: Secondary | ICD-10-CM | POA: Diagnosis not present

## 2016-03-18 DIAGNOSIS — M79641 Pain in right hand: Secondary | ICD-10-CM | POA: Diagnosis not present

## 2016-03-18 DIAGNOSIS — M24541 Contracture, right hand: Secondary | ICD-10-CM | POA: Diagnosis not present

## 2016-03-18 DIAGNOSIS — M25441 Effusion, right hand: Secondary | ICD-10-CM | POA: Diagnosis not present

## 2016-05-22 DIAGNOSIS — J302 Other seasonal allergic rhinitis: Secondary | ICD-10-CM | POA: Diagnosis not present

## 2016-05-22 DIAGNOSIS — R05 Cough: Secondary | ICD-10-CM | POA: Diagnosis not present

## 2016-06-13 DIAGNOSIS — Z1231 Encounter for screening mammogram for malignant neoplasm of breast: Secondary | ICD-10-CM | POA: Diagnosis not present

## 2016-06-18 DIAGNOSIS — M1811 Unilateral primary osteoarthritis of first carpometacarpal joint, right hand: Secondary | ICD-10-CM | POA: Diagnosis not present

## 2016-06-18 DIAGNOSIS — S63641A Sprain of metacarpophalangeal joint of right thumb, initial encounter: Secondary | ICD-10-CM | POA: Diagnosis not present

## 2016-06-18 DIAGNOSIS — M65311 Trigger thumb, right thumb: Secondary | ICD-10-CM | POA: Diagnosis not present

## 2016-06-23 ENCOUNTER — Other Ambulatory Visit: Payer: Self-pay | Admitting: Obstetrics and Gynecology

## 2016-06-23 ENCOUNTER — Other Ambulatory Visit (HOSPITAL_COMMUNITY)
Admission: RE | Admit: 2016-06-23 | Discharge: 2016-06-23 | Disposition: A | Discharge status: Home / Self Care | Payer: Medicare Other | Source: Ambulatory Visit | Attending: Obstetrics and Gynecology | Admitting: Obstetrics and Gynecology

## 2016-06-23 DIAGNOSIS — Z1151 Encounter for screening for human papillomavirus (HPV): Secondary | ICD-10-CM | POA: Insufficient documentation

## 2016-06-23 DIAGNOSIS — Z01419 Encounter for gynecological examination (general) (routine) without abnormal findings: Secondary | ICD-10-CM | POA: Diagnosis not present

## 2016-06-23 DIAGNOSIS — Z01411 Encounter for gynecological examination (general) (routine) with abnormal findings: Secondary | ICD-10-CM | POA: Diagnosis not present

## 2016-06-25 LAB — CYTOLOGY - PAP
Diagnosis: NEGATIVE
HPV (WINDOPATH): NOT DETECTED

## 2016-09-02 DIAGNOSIS — Z1211 Encounter for screening for malignant neoplasm of colon: Secondary | ICD-10-CM | POA: Diagnosis not present

## 2016-11-14 DIAGNOSIS — E785 Hyperlipidemia, unspecified: Secondary | ICD-10-CM | POA: Diagnosis not present

## 2016-11-14 DIAGNOSIS — R7309 Other abnormal glucose: Secondary | ICD-10-CM | POA: Diagnosis not present

## 2016-11-20 DIAGNOSIS — G43909 Migraine, unspecified, not intractable, without status migrainosus: Secondary | ICD-10-CM | POA: Diagnosis not present

## 2016-11-20 DIAGNOSIS — M797 Fibromyalgia: Secondary | ICD-10-CM | POA: Diagnosis not present

## 2016-11-20 DIAGNOSIS — K219 Gastro-esophageal reflux disease without esophagitis: Secondary | ICD-10-CM | POA: Diagnosis not present

## 2016-11-20 DIAGNOSIS — Z0000 Encounter for general adult medical examination without abnormal findings: Secondary | ICD-10-CM | POA: Diagnosis not present

## 2016-11-20 DIAGNOSIS — R7309 Other abnormal glucose: Secondary | ICD-10-CM | POA: Diagnosis not present

## 2016-11-21 ENCOUNTER — Other Ambulatory Visit: Payer: Self-pay | Admitting: Internal Medicine

## 2016-11-21 DIAGNOSIS — R51 Headache: Secondary | ICD-10-CM

## 2016-11-21 DIAGNOSIS — R42 Dizziness and giddiness: Secondary | ICD-10-CM

## 2016-11-21 DIAGNOSIS — R519 Headache, unspecified: Secondary | ICD-10-CM

## 2016-11-25 ENCOUNTER — Ambulatory Visit
Admission: RE | Admit: 2016-11-25 | Discharge: 2016-11-25 | Disposition: A | Discharge status: Home / Self Care | Payer: Medicare Other | Source: Ambulatory Visit | Attending: Internal Medicine | Admitting: Internal Medicine

## 2016-11-25 DIAGNOSIS — R519 Headache, unspecified: Secondary | ICD-10-CM

## 2016-11-25 DIAGNOSIS — R51 Headache: Secondary | ICD-10-CM

## 2016-11-25 DIAGNOSIS — R42 Dizziness and giddiness: Secondary | ICD-10-CM

## 2016-12-01 DIAGNOSIS — M791 Myalgia: Secondary | ICD-10-CM | POA: Diagnosis not present

## 2016-12-01 DIAGNOSIS — M79642 Pain in left hand: Secondary | ICD-10-CM | POA: Diagnosis not present

## 2016-12-01 DIAGNOSIS — M1811 Unilateral primary osteoarthritis of first carpometacarpal joint, right hand: Secondary | ICD-10-CM | POA: Diagnosis not present

## 2016-12-01 DIAGNOSIS — M19072 Primary osteoarthritis, left ankle and foot: Secondary | ICD-10-CM | POA: Diagnosis not present

## 2016-12-01 DIAGNOSIS — Z79899 Other long term (current) drug therapy: Secondary | ICD-10-CM | POA: Diagnosis not present

## 2016-12-01 DIAGNOSIS — M79671 Pain in right foot: Secondary | ICD-10-CM | POA: Diagnosis not present

## 2016-12-01 DIAGNOSIS — M797 Fibromyalgia: Secondary | ICD-10-CM | POA: Diagnosis not present

## 2016-12-01 DIAGNOSIS — M79643 Pain in unspecified hand: Secondary | ICD-10-CM | POA: Diagnosis not present

## 2016-12-01 DIAGNOSIS — M2550 Pain in unspecified joint: Secondary | ICD-10-CM | POA: Diagnosis not present

## 2017-01-02 DIAGNOSIS — M791 Myalgia: Secondary | ICD-10-CM | POA: Diagnosis not present

## 2017-01-02 DIAGNOSIS — M79672 Pain in left foot: Secondary | ICD-10-CM | POA: Diagnosis not present

## 2017-01-02 DIAGNOSIS — M797 Fibromyalgia: Secondary | ICD-10-CM | POA: Diagnosis not present

## 2017-01-02 DIAGNOSIS — M2550 Pain in unspecified joint: Secondary | ICD-10-CM | POA: Diagnosis not present

## 2017-02-02 DIAGNOSIS — J302 Other seasonal allergic rhinitis: Secondary | ICD-10-CM | POA: Diagnosis not present

## 2017-02-18 DIAGNOSIS — R140 Abdominal distension (gaseous): Secondary | ICD-10-CM | POA: Diagnosis not present

## 2017-02-18 DIAGNOSIS — N914 Secondary oligomenorrhea: Secondary | ICD-10-CM | POA: Diagnosis not present

## 2017-02-18 DIAGNOSIS — N644 Mastodynia: Secondary | ICD-10-CM | POA: Diagnosis not present

## 2017-02-24 DIAGNOSIS — R922 Inconclusive mammogram: Secondary | ICD-10-CM | POA: Diagnosis not present

## 2017-02-24 DIAGNOSIS — N644 Mastodynia: Secondary | ICD-10-CM | POA: Diagnosis not present

## 2017-03-03 DIAGNOSIS — R140 Abdominal distension (gaseous): Secondary | ICD-10-CM | POA: Diagnosis not present

## 2017-03-19 DIAGNOSIS — G43709 Chronic migraine without aura, not intractable, without status migrainosus: Secondary | ICD-10-CM | POA: Diagnosis not present

## 2017-03-31 ENCOUNTER — Encounter (HOSPITAL_COMMUNITY): Payer: Self-pay | Admitting: Emergency Medicine

## 2017-03-31 ENCOUNTER — Emergency Department (HOSPITAL_COMMUNITY)
Admission: EM | Admit: 2017-03-31 | Discharge: 2017-03-31 | Disposition: A | Discharge status: Home / Self Care | Payer: Medicare Other | Attending: Emergency Medicine | Admitting: Emergency Medicine

## 2017-03-31 DIAGNOSIS — Y9389 Activity, other specified: Secondary | ICD-10-CM | POA: Diagnosis not present

## 2017-03-31 DIAGNOSIS — W57XXXA Bitten or stung by nonvenomous insect and other nonvenomous arthropods, initial encounter: Secondary | ICD-10-CM | POA: Insufficient documentation

## 2017-03-31 DIAGNOSIS — Z79899 Other long term (current) drug therapy: Secondary | ICD-10-CM | POA: Insufficient documentation

## 2017-03-31 DIAGNOSIS — S60467A Insect bite (nonvenomous) of left little finger, initial encounter: Secondary | ICD-10-CM | POA: Diagnosis not present

## 2017-03-31 DIAGNOSIS — Y998 Other external cause status: Secondary | ICD-10-CM | POA: Diagnosis not present

## 2017-03-31 DIAGNOSIS — Y9289 Other specified places as the place of occurrence of the external cause: Secondary | ICD-10-CM | POA: Insufficient documentation

## 2017-03-31 DIAGNOSIS — M791 Myalgia: Secondary | ICD-10-CM | POA: Insufficient documentation

## 2017-03-31 DIAGNOSIS — S60477A Other superficial bite of left little finger, initial encounter: Secondary | ICD-10-CM | POA: Diagnosis not present

## 2017-03-31 MED ORDER — OXYCODONE-ACETAMINOPHEN 5-325 MG PO TABS
1.0000 | ORAL_TABLET | Freq: Once | ORAL | Status: AC
  Administered 2017-03-31: 1 via ORAL

## 2017-03-31 MED ORDER — OXYCODONE-ACETAMINOPHEN 5-325 MG PO TABS
ORAL_TABLET | ORAL | Status: AC
  Filled 2017-03-31: qty 1

## 2017-03-31 NOTE — ED Provider Notes (Signed)
[facility] DEPT Provider Note   CSN: [id number] Arrival date & time: 03/31/17  2027     History   Chief Complaint Chief Complaint  Patient presents with  . Insect Bite    HPI Kerry Ortiz is a 51 y.o. female.  Patient stung on a gloved hand while working in the yard today. Patient reports pain/burning to lateral aspect of left little finger.  No history of allergic reaction to insect stings.    Extremity Pain  This is a new problem. The current episode started 3 to 5 hours ago.    Past Medical History:  Diagnosis Date  . Acid reflux   . Disturbance of skin sensation   . Fibromyalgia   . Uterine fibroid     Patient Active Problem List   Diagnosis Date Noted  . Paresthesia 02/07/2016  . ABDOMINAL BLOATING 11/08/2008  . EPIGASTRIC PAIN 11/08/2008  . DEPRESSION 10/31/2008  . OSTEOARTHRITIS 10/31/2008  . FIBROMYALGIA 10/31/2008  . HEADACHE, CHRONIC 10/31/2008  . ABDOMINAL PAIN, RECURRENT 10/31/2008  . CARPAL TUNNEL SYNDROME, HX OF 10/31/2008    Past Surgical History:  Procedure Laterality Date  . CARPAL TUNNEL RELEASE    . CYSTECTOMY    . SHOULDER SURGERY      OB History    No data available       Home Medications    Prior to Admission medications   Medication Sig Start Date End Date Taking? Authorizing Provider  albuterol (PROVENTIL HFA;VENTOLIN HFA) 108 (90 BASE) MCG/ACT inhaler Inhale 2 puffs into the lungs every 6 (six) hours as needed for wheezing or shortness of breath. 10/24/14   Gregor Hams, MD  amitriptyline (ELAVIL) 25 MG tablet Take 25 mg by mouth at bedtime. 04/23/15   [provider]  citalopram (CELEXA) 10 MG tablet Take 1 tablet (10 mg total) by mouth daily. 05/03/15   Marcial Pacas, MD  cyclobenzaprine (FLEXERIL) 10 MG tablet Take 10 mg by mouth 3 (three) times daily as needed for muscle spasms.    [provider]  diclofenac (VOLTAREN) 75 MG EC tablet Take 75 mg by mouth 2 (two) times daily.    [provider]   esomeprazole (NEXIUM) 40 MG capsule Take 40 mg by mouth daily before breakfast.    [provider]  fluticasone (FLONASE) 50 MCG/ACT nasal spray Place 2 sprays into both nostrils daily. 10/24/14   Gregor Hams, MD  HYDROcodone-acetaminophen (NORCO) 10-325 MG per tablet Take 1 tablet by mouth every 6 (six) hours as needed (pain).    [provider]  ibuprofen (ADVIL,MOTRIN) 800 MG tablet Take 1 tablet (800 mg total) by mouth every 8 (eight) hours as needed for mild pain or moderate pain (pain.). 09/29/15   Antonietta Breach, PA-C  pregabalin (LYRICA) 150 MG capsule Take 150 mg by mouth 2 (two) times daily.     [provider]  Spacer/Aero-Holding Chambers (AEROCHAMBER PLUS FLO-VU LARGE) MISC 1 each by Other route once. 09/23/13   Liam Graham, PA-C  sucralfate (CARAFATE) 1 G tablet Take 1 tablet (1 g total) by mouth 4 (four) times daily. Patient taking differently: Take 1 g by mouth daily.  07/11/12   Malvin Johns, MD  UNABLE TO FIND Med Name: Using compound cream for back and feet pain.    [provider]  zonisamide (ZONEGRAN) 25 MG capsule Take 75 mg by mouth at bedtime.     [provider]    Family History Family History  Problem Relation  Age of Onset  . Hypertension Mother   . Arrhythmia Mother   . Thyroid disease Mother   . Diabetes Maternal Grandmother   . Kidney disease Sister   . Multiple sclerosis Cousin     Social History Social History  Substance Use Topics  . Smoking status: Never Smoker  . Smokeless tobacco: Never Used  . Alcohol use No     Allergies   Patient has no known allergies.   Review of Systems Review of Systems  Musculoskeletal: Positive for myalgias.  All other systems reviewed and are negative.    Physical Exam Updated Vital Signs BP 119/80 (BP Location: Right Arm)   Pulse 90   Temp 98.5 F (36.9 C) (Oral)   Resp 16   Ht 5\' 2"  (1.575 m)   Wt 57.2 kg (126 lb)   LMP 02/27/2017 (Approximate)    SpO2 100%   BMI 23.05 kg/m   Physical Exam  Constitutional: She is oriented to person, place, and time. She appears well-developed and well-nourished. No distress.  HENT:  Head: Normocephalic.  Eyes: Conjunctivae are normal.  Neck: Neck supple.  Cardiovascular: Normal rate and regular rhythm.   Pulmonary/Chest: Effort normal and breath sounds normal.  Abdominal: Soft. Bowel sounds are normal.  Musculoskeletal: Normal range of motion. She exhibits tenderness. She exhibits no edema.  Lymphadenopathy:    She has no cervical adenopathy.  Neurological: She is alert and oriented to person, place, and time.  Skin: Skin is warm and dry. There is erythema.  Minimal erythema to medial aspect of left little finger. No obvious puncture wound.  Nursing note and vitals reviewed.    ED Treatments / Results  Labs (all labs ordered are listed, but only abnormal results are displayed) Labs Reviewed - No data to display  EKG  EKG Interpretation None       Radiology No results found.  Procedures Procedures (including critical care time)  Medications Ordered in ED Medications  oxyCODONE-acetaminophen (PERCOCET/ROXICET) 5-325 MG per tablet (not administered)  oxyCODONE-acetaminophen (PERCOCET/ROXICET) 5-325 MG per tablet 1 tablet (1 tablet Oral Given 03/31/17 2045)     Initial Impression / Assessment and Plan / ED Course  I have reviewed the triage vital signs and the nursing notes.  Pertinent labs & imaging results that were available during my care of the patient were reviewed by me and considered in my medical decision making (see chart for details).     Patient with insect sting left little finger. Minimal localized swelling and erythema.  No systemic involvement. Care instructions and return precautions discussed.  Final Clinical Impressions(s) / ED Diagnoses   Final diagnoses:  Insect bite, initial encounter    New Prescriptions Discharge Medication List as of 03/31/2017   9:17 PM       Etta Quill, NP 03/31/17 2337    Sherwood Gambler, MD 04/01/17 0028

## 2017-03-31 NOTE — Discharge Instructions (Signed)
Use ibuprofen or tylenol for discomfort. Take benadryl, 25 mg, every 8 hours as needed for itching/burning.

## 2017-03-31 NOTE — ED Triage Notes (Signed)
Patient reports insect bite at left 5th finger this evening while mowing the yard , respirations unlabored / no oral swelling .

## 2017-04-02 NOTE — Patient Outreach (Signed)
Outreached patient after ED visit on 03/31/17.  Patient verified PCP and stated she is scheduling a follow up appointment with him.  Patient does know how to reach Doctor and can do so.  She is considering going to Urgent Care because she said that her finger is not feeling any better.  I explained [facility] services and gave patient 24 hour Nurse Advice Line and she made note of it.  I also told her that I would mail information regarding [facility] to her.  I asked did she want a follow up call from one of my team members to further explain our services? If so I had a few questions to ask and they would call her back within 7 to 10 days.  She said no but thank you for the call.

## 2017-04-03 DIAGNOSIS — L03012 Cellulitis of left finger: Secondary | ICD-10-CM | POA: Diagnosis not present

## 2017-04-23 ENCOUNTER — Ambulatory Visit (INDEPENDENT_AMBULATORY_CARE_PROVIDER_SITE_OTHER): Payer: Medicare Other | Admitting: Neurology

## 2017-04-23 ENCOUNTER — Encounter: Payer: Self-pay | Admitting: Neurology

## 2017-04-23 VITALS — BP 114/72 | HR 61 | Ht 62.0 in | Wt 129.0 lb

## 2017-04-23 DIAGNOSIS — G43709 Chronic migraine without aura, not intractable, without status migrainosus: Secondary | ICD-10-CM | POA: Insufficient documentation

## 2017-04-23 DIAGNOSIS — R809 Proteinuria, unspecified: Secondary | ICD-10-CM | POA: Diagnosis not present

## 2017-04-23 DIAGNOSIS — N390 Urinary tract infection, site not specified: Secondary | ICD-10-CM | POA: Diagnosis not present

## 2017-04-23 DIAGNOSIS — IMO0002 Reserved for concepts with insufficient information to code with codable children: Secondary | ICD-10-CM | POA: Insufficient documentation

## 2017-04-23 MED ORDER — ONDANSETRON 4 MG PO TBDP: 4.0000 mg | ORAL_TABLET | Freq: Three times a day (TID) | ORAL | 6 refills | Status: DC | PRN

## 2017-04-23 MED ORDER — ZONISAMIDE 100 MG PO CAPS: 100.0000 mg | ORAL_CAPSULE | Freq: Two times a day (BID) | ORAL | 11 refills | Status: DC

## 2017-04-23 MED ORDER — RIZATRIPTAN BENZOATE 10 MG PO TBDP: 10.0000 mg | ORAL_TABLET | ORAL | 6 refills | Status: DC | PRN

## 2017-04-23 NOTE — Progress Notes (Signed)
PATIENT: Kerry Ortiz DOB: 1965-11-25  Chief Complaint  Patient presents with  . Migraine    She has previously been treated at the [facility].  She has failed multiple medications (she does not remember the names) in the past, including Botox.  Her headaches have been well controlled with zonisamide 25mg  and amitriptyline 25mg , both taken at bedtime.  However, she has been experiencing daily headaches for the last two months.  She has tried diclofenac and ibuprofen but it has not been helpful for her pain.     HISTORICAL  Kerry Ortiz is a 51 year old female, seen in refer by her primary care doctor Merrilee Seashore for evaluation of chronic migraine, initial evaluation was on April 23 2017.  I reviewed and summarized the referring note, she had past medical history of acid reflux, fibromyalgia, prediabetes, hyperlipidemia, chronic migraine headaches,  She reported history of headaches since age 38, her typical migraine starting from occipital region spreading forward pounding severe headache, associated light noise sensitivity, lasting for a few hours, she has tried over-the-counter Tylenol ibuprofen with limited help, she is now getting more frequent headaches since July 2018 about 2-3 times each week,  She has been evaluated by [facility] in the past, still taking nortriptyline 25 mg 2 tablets every night, zonisamide 25 mg 3 tablets night as preventative medications,  I saw her previously for diffuse body achy pain, paresthesias years throughout her body in 2017,   she had a history of fibromyalgia, carpal tunnel release surgery in the past, chronic migraines, she went on disability since 2008, complains of diffuse body achy pain, on polypharmacy treatment, including Elavil, Lyrica, Zonegran, She continue complains of depression symptoms, tearful during today's interview  Since 2012, she began to notice bilateral feet cold burning sensation,  involving bottom and top of her feet, gradually trending up to knee level, getting worse with pressure, barium weight, she has chronic low back and neck pain, denies radiating pain, gait difficulty due to pain, no bowel and bladder incontinence, no bilateral upper extremity paresthesia or weakness,  She complains of feeling tired all the time, lack of interest, I have reviewed laboratory in July 2016, A1c was elevated 5.9, normal B12 567, TSH 1.4, CMP, she had more laboratory evaluation in August 2016  She is on polypharmacy treatment, taking Lyrica 150 mg twice a day, Celexa 10 mg daily, she still has intermittent bilateral feet paresthesia, no significant pain  EMG nerve conduction study in December 2016, there is no evidence of large fiber peripheral neuropathy  I also reviewed laboratory evaluation in November 2016:normal CBC, mildly low potassium on BMP 3.4  REVIEW OF SYSTEMS: Full 14 system review of systems performed and notable only for weight gain, fatigue, chest pain, sweating attacks, spinning sensation itching, shortness of breath, cough, easy bruising, feeling hot, cold joint pain swelling cramps achy muscles running nose, headaches, weakness, dizziness restless leg, too much sleep disinteresting activities  ALLERGIES: No Known Allergies  HOME MEDICATIONS: Current Outpatient Prescriptions  Medication Sig Dispense Refill  . albuterol (PROVENTIL HFA;VENTOLIN HFA) 108 (90 BASE) MCG/ACT inhaler Inhale 2 puffs into the lungs every 6 (six) hours as needed for wheezing or shortness of breath. 1 Inhaler 2  . amitriptyline (ELAVIL) 25 MG tablet Take 25 mg by mouth at bedtime.    . citalopram (CELEXA) 10 MG tablet Take 1 tablet (10 mg total) by mouth daily. 30 tablet 6  . diclofenac (VOLTAREN) 75 MG EC tablet Take 75  mg by mouth 2 (two) times daily.    Marland Kitchen esomeprazole (NEXIUM) 40 MG capsule Take 40 mg by mouth daily before breakfast.    . fluticasone (FLONASE) 50 MCG/ACT nasal spray Place  2 sprays into both nostrils daily. 16 g 2  . ibuprofen (ADVIL,MOTRIN) 800 MG tablet Take 1 tablet (800 mg total) by mouth every 8 (eight) hours as needed for mild pain or moderate pain (pain.). 30 tablet 0  . pregabalin (LYRICA) 150 MG capsule Take 150 mg by mouth 2 (two) times daily.     Marland Kitchen Spacer/Aero-Holding Chambers (AEROCHAMBER PLUS FLO-VU LARGE) MISC 1 each by Other route once. 1 each 0  . sucralfate (CARAFATE) 1 G tablet Take 1 tablet (1 g total) by mouth 4 (four) times daily. (Patient taking differently: Take 1 g by mouth daily. ) 20 tablet 0  . zonisamide (ZONEGRAN) 25 MG capsule Take 75 mg by mouth at bedtime.      No current facility-administered medications for this visit.     PAST MEDICAL HISTORY: Past Medical History:  Diagnosis Date  . Acid reflux   . Asthma   . Disturbance of skin sensation   . Fibromyalgia   . Migraine   . Uterine fibroid     PAST SURGICAL HISTORY: Past Surgical History:  Procedure Laterality Date  . CARPAL TUNNEL RELEASE    . CYSTECTOMY    . HAND SURGERY Right   . SHOULDER SURGERY Right     FAMILY HISTORY: Family History  Problem Relation Age of Onset  . Hypertension Mother   . Arrhythmia Mother   . Thyroid disease Mother   . Pneumonia Father   . Diabetes Maternal Grandmother   . Kidney disease Sister   . Multiple sclerosis Cousin     SOCIAL HISTORY:  Social History   Social History  . Marital status: Single    Spouse name: N/A  . Number of children: 1  . Years of education: HS   Occupational History  . Disabled    Social History Main Topics  . Smoking status: Never Smoker  . Smokeless tobacco: Never Used  . Alcohol use No  . Drug use: No  . Sexual activity: Not Currently    Birth control/ protection: None   Other Topics Concern  . Not on file   Social History Narrative   Lives at home alone.   Right-handed.   No caffeine use.     PHYSICAL EXAM   Vitals:   04/23/17 1047  BP: 114/72  Pulse: 61  Weight: 129  lb (58.5 kg)  Height: 5\' 2"  (1.575 m)    Not recorded      Body mass index is 23.59 kg/m.  PHYSICAL EXAMNIATION:  Gen: NAD, conversant, well nourised, obese, well groomed                     Cardiovascular: Regular rate rhythm, no peripheral edema, warm, nontender. Eyes: Conjunctivae clear without exudates or hemorrhage Neck: Supple, no carotid bruits. Pulmonary: Clear to auscultation bilaterally   NEUROLOGICAL EXAM:  MENTAL STATUS: Speech:    Speech is normal; fluent and spontaneous with normal comprehension.  Cognition:     Orientation to time, place and person     Normal recent and remote memory     Normal Attention span and concentration     Normal Language, naming, repeating,spontaneous speech     Fund of knowledge   CRANIAL NERVES: CN II: Visual fields are full to confrontation. Fundoscopic  exam is normal with sharp discs and no vascular changes. Pupils are round equal and briskly reactive to light. CN III, IV, VI: extraocular movement are normal. No ptosis. CN V: Facial sensation is intact to pinprick in all 3 divisions bilaterally. Corneal responses are intact.  CN VII: Face is symmetric with normal eye closure and smile. CN VIII: Hearing is normal to rubbing fingers CN IX, X: Palate elevates symmetrically. Phonation is normal. CN XI: Head turning and shoulder shrug are intact CN XII: Tongue is midline with normal movements and no atrophy.  MOTOR: There is no pronator drift of out-stretched arms. Muscle bulk and tone are normal. Muscle strength is normal.  REFLEXES: Reflexes are 2+ and symmetric at the biceps, triceps, knees, and ankles. Plantar responses are flexor.  SENSORY: Intact to light touch, pinprick, positional sensation and vibratory sensation are intact in fingers and toes.  COORDINATION: Rapid alternating movements and fine finger movements are intact. There is no dysmetria on finger-to-nose and heel-knee-shin.    GAIT/STANCE: Posture is  normal. Gait is steady with normal steps, base, arm swing, and turning. Heel and toe walking are normal. Tandem gait is normal.  Romberg is absent.   DIAGNOSTIC DATA (LABS, IMAGING, TESTING) - I reviewed patient records, labs, notes, testing and imaging myself where available.   ASSESSMENT AND PLAN  Kerry Ortiz is a 51 y.o. female   Chronic migraine headache  Increased preventive medication zonisamide to 100 mg twice a day  Maxalt as needed, plus Zofran as needed Diffuse body achy pain  No treatable etiology found  Marcial Pacas, M.D. Ph.D.  [facility] [address], [place], [place] [address] Ph: [phone] Fax: [phone]  CC: Merrilee Seashore, MD

## 2017-04-24 DIAGNOSIS — R8290 Unspecified abnormal findings in urine: Secondary | ICD-10-CM | POA: Diagnosis not present

## 2017-05-07 ENCOUNTER — Encounter (HOSPITAL_COMMUNITY): Payer: Self-pay | Admitting: Emergency Medicine

## 2017-05-07 ENCOUNTER — Other Ambulatory Visit: Payer: Self-pay

## 2017-05-07 ENCOUNTER — Emergency Department (HOSPITAL_COMMUNITY): Payer: Medicare Other

## 2017-05-07 ENCOUNTER — Observation Stay (HOSPITAL_COMMUNITY)
Admission: EM | Admit: 2017-05-07 | Discharge: 2017-05-09 | Disposition: A | Discharge status: Home / Self Care | Payer: Medicare Other | Attending: Internal Medicine | Admitting: Internal Medicine

## 2017-05-07 DIAGNOSIS — Z7951 Long term (current) use of inhaled steroids: Secondary | ICD-10-CM | POA: Diagnosis not present

## 2017-05-07 DIAGNOSIS — R42 Dizziness and giddiness: Principal | ICD-10-CM

## 2017-05-07 DIAGNOSIS — R2990 Unspecified symptoms and signs involving the nervous system: Secondary | ICD-10-CM

## 2017-05-07 DIAGNOSIS — Z833 Family history of diabetes mellitus: Secondary | ICD-10-CM | POA: Insufficient documentation

## 2017-05-07 DIAGNOSIS — F329 Major depressive disorder, single episode, unspecified: Secondary | ICD-10-CM | POA: Diagnosis not present

## 2017-05-07 DIAGNOSIS — Z8349 Family history of other endocrine, nutritional and metabolic diseases: Secondary | ICD-10-CM | POA: Insufficient documentation

## 2017-05-07 DIAGNOSIS — R531 Weakness: Secondary | ICD-10-CM | POA: Diagnosis not present

## 2017-05-07 DIAGNOSIS — K219 Gastro-esophageal reflux disease without esophagitis: Secondary | ICD-10-CM | POA: Insufficient documentation

## 2017-05-07 DIAGNOSIS — R4781 Slurred speech: Secondary | ICD-10-CM | POA: Diagnosis not present

## 2017-05-07 DIAGNOSIS — G43909 Migraine, unspecified, not intractable, without status migrainosus: Secondary | ICD-10-CM | POA: Diagnosis not present

## 2017-05-07 DIAGNOSIS — Z841 Family history of disorders of kidney and ureter: Secondary | ICD-10-CM | POA: Diagnosis not present

## 2017-05-07 DIAGNOSIS — D649 Anemia, unspecified: Secondary | ICD-10-CM

## 2017-05-07 DIAGNOSIS — M797 Fibromyalgia: Secondary | ICD-10-CM | POA: Insufficient documentation

## 2017-05-07 DIAGNOSIS — D509 Iron deficiency anemia, unspecified: Secondary | ICD-10-CM | POA: Insufficient documentation

## 2017-05-07 DIAGNOSIS — Z820 Family history of epilepsy and other diseases of the nervous system: Secondary | ICD-10-CM | POA: Diagnosis not present

## 2017-05-07 DIAGNOSIS — R29898 Other symptoms and signs involving the musculoskeletal system: Secondary | ICD-10-CM | POA: Diagnosis present

## 2017-05-07 DIAGNOSIS — J45909 Unspecified asthma, uncomplicated: Secondary | ICD-10-CM | POA: Diagnosis not present

## 2017-05-07 DIAGNOSIS — E876 Hypokalemia: Secondary | ICD-10-CM | POA: Diagnosis not present

## 2017-05-07 DIAGNOSIS — Z8249 Family history of ischemic heart disease and other diseases of the circulatory system: Secondary | ICD-10-CM | POA: Diagnosis not present

## 2017-05-07 DIAGNOSIS — Z79899 Other long term (current) drug therapy: Secondary | ICD-10-CM | POA: Diagnosis not present

## 2017-05-07 DIAGNOSIS — Z791 Long term (current) use of non-steroidal anti-inflammatories (NSAID): Secondary | ICD-10-CM | POA: Insufficient documentation

## 2017-05-07 LAB — CBC
HCT: 35.2 % — ABNORMAL LOW (ref 36.0–46.0)
Hemoglobin: 11.5 g/dL — ABNORMAL LOW (ref 12.0–15.0)
MCH: 24.5 pg — ABNORMAL LOW (ref 26.0–34.0)
MCHC: 32.7 g/dL (ref 30.0–36.0)
MCV: 75.1 fL — ABNORMAL LOW (ref 78.0–100.0)
Platelets: 306 10*3/uL (ref 150–400)
RBC: 4.69 MIL/uL (ref 3.87–5.11)
RDW: 14.7 % (ref 11.5–15.5)
WBC: 4 10*3/uL (ref 4.0–10.5)

## 2017-05-07 LAB — COMPREHENSIVE METABOLIC PANEL
ALBUMIN: 4 g/dL (ref 3.5–5.0)
ALT: 21 U/L (ref 14–54)
AST: 19 U/L (ref 15–41)
Alkaline Phosphatase: 35 U/L — ABNORMAL LOW (ref 38–126)
Anion gap: 8 (ref 5–15)
BUN: 12 mg/dL (ref 6–20)
CHLORIDE: 109 mmol/L (ref 101–111)
CO2: 22 mmol/L (ref 22–32)
Calcium: 9.2 mg/dL (ref 8.9–10.3)
Creatinine, Ser: 0.86 mg/dL (ref 0.44–1.00)
GFR calc Af Amer: >60 mL/min (ref >60)
GFR calc non Af Amer: >60 mL/min (ref >60)
GLUCOSE: 94 mg/dL (ref 65–99)
POTASSIUM: 3.3 mmol/L — AB (ref 3.5–5.1)
Sodium: 139 mmol/L (ref 135–145)
Total Bilirubin: 0.7 mg/dL (ref 0.3–1.2)
Total Protein: 7.3 g/dL (ref 6.5–8.1)

## 2017-05-07 LAB — I-STAT CHEM 8, ED
BUN: 13 mg/dL (ref 6–20)
CHLORIDE: 109 mmol/L (ref 101–111)
Calcium, Ion: 1.19 mmol/L (ref 1.15–1.40)
Creatinine, Ser: 0.8 mg/dL (ref 0.44–1.00)
Glucose, Bld: 92 mg/dL (ref 65–99)
HEMATOCRIT: 41 % (ref 36.0–46.0)
Hemoglobin: 13.9 g/dL (ref 12.0–15.0)
POTASSIUM: 3.3 mmol/L — AB (ref 3.5–5.1)
SODIUM: 142 mmol/L (ref 135–145)
TCO2: 22 mmol/L (ref 22–32)

## 2017-05-07 LAB — DIFFERENTIAL
BASOS PCT: 1 %
Basophils Absolute: 0 10*3/uL (ref 0.0–0.1)
EOS ABS: 0 10*3/uL (ref 0.0–0.7)
Eosinophils Relative: 1 %
Lymphocytes Relative: 55 %
Lymphs Abs: 2.2 10*3/uL (ref 0.7–4.0)
Monocytes Absolute: 0.1 10*3/uL (ref 0.1–1.0)
Monocytes Relative: 4 %
NEUTROS ABS: 1.6 10*3/uL — AB (ref 1.7–7.7)
NEUTROS PCT: 41 %

## 2017-05-07 LAB — PROTIME-INR
INR: 0.92
Prothrombin Time: 12.3 seconds (ref 11.4–15.2)

## 2017-05-07 LAB — APTT: aPTT: 28 seconds (ref 24–36)

## 2017-05-07 LAB — CBG MONITORING, ED: Glucose-Capillary: 85 mg/dL (ref 65–99)

## 2017-05-07 LAB — I-STAT TROPONIN, ED: Troponin i, poc: 0.01 ng/mL (ref 0.00–0.08)

## 2017-05-07 MED ORDER — STROKE: EARLY STAGES OF RECOVERY BOOK
Freq: Once | Status: AC
  Administered 2017-05-07: 1

## 2017-05-07 MED ORDER — ALBUTEROL SULFATE (2.5 MG/3ML) 0.083% IN NEBU: 3.0000 mL | INHALATION_SOLUTION | Freq: Four times a day (QID) | RESPIRATORY_TRACT | Status: DC | PRN

## 2017-05-07 MED ORDER — ENOXAPARIN SODIUM 40 MG/0.4ML ~~LOC~~ SOLN
40.0000 mg | SUBCUTANEOUS | Status: DC
  Administered 2017-05-07 – 2017-05-08 (×2): 40 mg via SUBCUTANEOUS
  Filled 2017-05-07 (×2): qty 0.4

## 2017-05-07 MED ORDER — ONDANSETRON 4 MG PO TBDP
4.0000 mg | ORAL_TABLET | Freq: Three times a day (TID) | ORAL | Status: DC | PRN
  Filled 2017-05-07: qty 1

## 2017-05-07 MED ORDER — ATORVASTATIN CALCIUM 80 MG PO TABS
80.0000 mg | ORAL_TABLET | Freq: Every day | ORAL | Status: DC
  Administered 2017-05-08: 80 mg via ORAL
  Filled 2017-05-07: qty 1

## 2017-05-07 MED ORDER — ZONISAMIDE 100 MG PO CAPS
100.0000 mg | ORAL_CAPSULE | Freq: Two times a day (BID) | ORAL | Status: DC
  Administered 2017-05-07 – 2017-05-09 (×4): 100 mg via ORAL
  Filled 2017-05-07 (×4): qty 1

## 2017-05-07 MED ORDER — ACETAMINOPHEN 325 MG PO TABS: 650.0000 mg | ORAL_TABLET | ORAL | Status: DC | PRN

## 2017-05-07 MED ORDER — DIPHENHYDRAMINE HCL 50 MG/ML IJ SOLN
25.0000 mg | Freq: Once | INTRAMUSCULAR | Status: AC
  Administered 2017-05-07: 25 mg via INTRAVENOUS
  Filled 2017-05-07: qty 1

## 2017-05-07 MED ORDER — PROCHLORPERAZINE EDISYLATE 5 MG/ML IJ SOLN
10.0000 mg | Freq: Once | INTRAMUSCULAR | Status: AC
  Administered 2017-05-07: 10 mg via INTRAVENOUS
  Filled 2017-05-07: qty 2

## 2017-05-07 MED ORDER — ACETAMINOPHEN 160 MG/5ML PO SOLN: 650.0000 mg | ORAL | Status: DC | PRN

## 2017-05-07 MED ORDER — CITALOPRAM HYDROBROMIDE 10 MG PO TABS
10.0000 mg | ORAL_TABLET | Freq: Every day | ORAL | Status: DC
  Administered 2017-05-08 – 2017-05-09 (×2): 10 mg via ORAL
  Filled 2017-05-07 (×3): qty 1

## 2017-05-07 MED ORDER — PREGABALIN 75 MG PO CAPS
150.0000 mg | ORAL_CAPSULE | Freq: Two times a day (BID) | ORAL | Status: DC
  Administered 2017-05-07 – 2017-05-09 (×4): 150 mg via ORAL
  Filled 2017-05-07 (×4): qty 2

## 2017-05-07 MED ORDER — POTASSIUM CHLORIDE CRYS ER 20 MEQ PO TBCR
20.0000 meq | EXTENDED_RELEASE_TABLET | Freq: Once | ORAL | Status: AC
  Administered 2017-05-07: 20 meq via ORAL
  Filled 2017-05-07: qty 1

## 2017-05-07 MED ORDER — ASPIRIN 300 MG RE SUPP: 300.0000 mg | Freq: Every day | RECTAL | Status: DC

## 2017-05-07 MED ORDER — AMITRIPTYLINE HCL 25 MG PO TABS
25.0000 mg | ORAL_TABLET | Freq: Every day | ORAL | Status: DC
  Administered 2017-05-07 – 2017-05-08 (×2): 25 mg via ORAL
  Filled 2017-05-07 (×2): qty 1

## 2017-05-07 MED ORDER — TIZANIDINE HCL 4 MG PO TABS: 4.0000 mg | ORAL_TABLET | Freq: Three times a day (TID) | ORAL | Status: DC | PRN

## 2017-05-07 MED ORDER — MECLIZINE HCL 12.5 MG PO TABS
25.0000 mg | ORAL_TABLET | Freq: Three times a day (TID) | ORAL | Status: DC | PRN
  Administered 2017-05-08: 25 mg via ORAL
  Filled 2017-05-07: qty 1
  Filled 2017-05-07: qty 2

## 2017-05-07 MED ORDER — PANTOPRAZOLE SODIUM 40 MG PO TBEC
40.0000 mg | DELAYED_RELEASE_TABLET | Freq: Every day | ORAL | Status: DC
  Administered 2017-05-08 – 2017-05-09 (×2): 40 mg via ORAL
  Filled 2017-05-07 (×2): qty 1

## 2017-05-07 MED ORDER — SUCRALFATE 1 G PO TABS
1.0000 g | ORAL_TABLET | Freq: Every morning | ORAL | Status: DC
  Administered 2017-05-08 – 2017-05-09 (×2): 1 g via ORAL
  Filled 2017-05-07 (×2): qty 1

## 2017-05-07 MED ORDER — AEROCHAMBER PLUS FLO-VU LARGE MISC: 1.0000 | Freq: Once | Status: DC

## 2017-05-07 MED ORDER — SUMATRIPTAN SUCCINATE 100 MG PO TABS
100.0000 mg | ORAL_TABLET | Freq: Once | ORAL | Status: DC | PRN
  Filled 2017-05-07: qty 1

## 2017-05-07 MED ORDER — SODIUM CHLORIDE 0.9 % IV SOLN
INTRAVENOUS | Status: AC
  Administered 2017-05-07: via INTRAVENOUS

## 2017-05-07 MED ORDER — ACETAMINOPHEN 650 MG RE SUPP: 650.0000 mg | RECTAL | Status: DC | PRN

## 2017-05-07 MED ORDER — DICLOFENAC SODIUM 75 MG PO TBEC
75.0000 mg | DELAYED_RELEASE_TABLET | Freq: Two times a day (BID) | ORAL | Status: DC
  Administered 2017-05-07 – 2017-05-09 (×4): 75 mg via ORAL
  Filled 2017-05-07 (×4): qty 1

## 2017-05-07 MED ORDER — ASPIRIN 325 MG PO TABS
325.0000 mg | ORAL_TABLET | Freq: Every day | ORAL | Status: DC
  Administered 2017-05-08 – 2017-05-09 (×2): 325 mg via ORAL
  Filled 2017-05-07 (×2): qty 1

## 2017-05-07 MED ORDER — FLUTICASONE PROPIONATE 50 MCG/ACT NA SUSP
2.0000 | Freq: Every day | NASAL | Status: DC | PRN
  Filled 2017-05-07: qty 16

## 2017-05-07 NOTE — Consult Note (Signed)
Neurology Consultation Reason for Consult: Vertigo Referring Physician: Tegeler, C  CC: Vertigo  History is obtained from: Patient  HPI: Kerry Ortiz is a 51 y.o. female began having vertigo around 11:30 AM. She states that any movement brings this on. It is worse with standing up, but also gets worse with laying down, really any type of movement brings on. She has nausea and vomiting as well. She also complains of diffuse cephalgia associated with phonophobia and photophobia.  She does have a history of migraine, but this feels slightly different than her typical migraines. She denies any history of visual aura, tingling, previous episodes of vertigo. She denies any hearing changes or tinnitus.   ROS: A 14 point ROS was performed and is negative except as noted in the HPI.   Past Medical History:  Diagnosis Date  . Acid reflux   . Asthma   . Disturbance of skin sensation   . Fibromyalgia   . Migraine   . Uterine fibroid      Family History  Problem Relation Age of Onset  . Hypertension Mother   . Arrhythmia Mother   . Thyroid disease Mother   . Pneumonia Father   . Diabetes Maternal Grandmother   . Kidney disease Sister   . Multiple sclerosis Cousin      Social History:  reports that she has never smoked. She has never used smokeless tobacco. She reports that she does not drink alcohol or use drugs.   Exam: Current vital signs: BP (!) 143/89 (BP Location: Right Arm)   Pulse 72   Temp 98.8 F (37.1 C) (Oral)   Resp 16   Ht 5\' 2"  (1.575 m)   Wt 58.1 kg (128 lb)   SpO2 100%   BMI 23.41 kg/m  Vital signs in last 24 hours: Temp:  [97.8 F (36.6 C)-98.8 F (37.1 C)] 98.8 F (37.1 C) (10/04 2307) Pulse Rate:  [55-72] 72 (10/04 2307) Resp:  [15-18] 16 (10/04 2307) BP: (120-143)/(73-92) 143/89 (10/04 2307) SpO2:  [100 %] 100 % (10/04 2307) Weight:  [58.1 kg (128 lb)] 58.1 kg (128 lb) (10/04 2307)   Physical Exam  Constitutional: Appears well-developed and  well-nourished.  Psych: Affect appropriate to situation Eyes: No scleral injection HENT: No OP obstrucion Head: Normocephalic.  Cardiovascular: Normal rate and regular rhythm.  Respiratory: Effort normal and breath sounds normal to anterior ascultation GI: Soft.  No distension. There is no tenderness.  Skin: WDI  Neuro: Mental Status: Patient is awake, alert, oriented to person, place, month, year, and situation. Patient is able to give a clear and coherent history. No signs of aphasia or neglect Cranial Nerves: II: Visual Fields are full. Pupils are equal, round, and reactive to light.   III,IV, VI: EOMI without ptosis or diploplia. She has end gaze nystagmus bilaterally, head thrust is negative. V: Facial sensation is symmetric to temperature VII: Facial movement is symmetric.  VIII: hearing is intact to voice X: Uvula elevates symmetrically XI: Shoulder shrug is symmetric. XII: tongue is midline without atrophy or fasciculations.  Motor: Tone is normal. Bulk is normal. 5/5 strength was present in all four extremities.  Sensory: Sensation is symmetric to light touch and temperature in the arms and legs. Deep Tendon Reflexes: 2+ and symmetric in the biceps and patellae.  Plantars: Toes are downgoing bilaterally.  Cerebellar: FNF and HKS are intact bilaterally  Head thrust, Dix-Hallpike are negative    I have reviewed labs in epic and the results pertinent to  this consultation are: CMP-unremarkable  I have reviewed the images obtained: MRI brain-no acute findings  Impression: 51 year old female with vertigo of unclear . Etiology. With her headache, I suspect that this represents complicated migraine. Vestibulitis would also be a consideration, but there is no evidence of peripheral or central causes of vertigo on exam. If she were to develop evidence of peripheral etiology, then I would consider steroids, but I feel this is low likelihood at this  time.  Recommendations: 1) Compazine, Benadryl 2) could use meclizine when necessary 3) physical therapy   Roland Rack, MD [facility] [phone]  If 7pm- 7am, please page neurology on call as listed in [place].

## 2017-05-07 NOTE — ED Notes (Signed)
Patient transported to MRI 

## 2017-05-07 NOTE — ED Notes (Signed)
CBG done at 18:51, was 86.

## 2017-05-07 NOTE — H&P (Addendum)
TRH H&P   Patient Demographics:    Gargi Berch, is a 51 y.o. female  MRN: [id number]   DOB - Sep 02, 1965  Admit Date - 05/07/2017  Outpatient Primary MD for the patient is Merrilee Seashore, MD  Referring MD/NP/PA: Irena Cords  Outpatient Specialists:  Jamey Ripa (neurology)  Patient coming from: home  Chief Complaint  Patient presents with  . Stroke Symptoms      HPI:    Amiya Escamilla  is a 51 y.o. female, w migraines, fibromyalgia , back pain, apparently presents with dizziness and left lower ext weakness.  Starting this am about 11:30am.  Took tizanidine last nite and took nexium this am and went to restroom and got up and felt dizzy. "Vertigo"  , + nausea.  No syncope.  Left leg felt weak.  Pt denies headache, tinnitus, hearing loss, ear infection, sinus issues, cp, palp, sob, numbness, tingling.  Nothing appeared to make the vertigo/weakness better or worse, and therefore presented to ED for evaluation because of persistence of symptoms.   In ED, CT brain negative. K=3.3,  MRI brain =. Pending.  Neurology consulted , requested admission for vertigo, left leg weakness.      Review of systems:    In addition to the HPI above,    + left arm weakness starting a couple of days ago.   No Fever-chills, No Headache, No changes with Vision or hearing, No problems swallowing food or Liquids, No  Cough or Shortness of Breath, + intermittent cp for a few sec, for years.  No Abdominal pain, No  Vommitting, Bowel movements are regular, No Blood in stool or Urine, No dysuria, No new skin rashes or bruises, No new joints pains-aches,   No recent weight gain or loss, No polyuria, polydypsia or polyphagia, No significant Mental Stressors.  A full 10 point Review of Systems was done, except as stated above, all other Review of Systems were negative.   With Past History of  the following :    Past Medical History:  Diagnosis Date  . Acid reflux   . Asthma   . Disturbance of skin sensation   . Fibromyalgia   . Migraine   . Uterine fibroid       Past Surgical History:  Procedure Laterality Date  . CARPAL TUNNEL RELEASE    . CYSTECTOMY    . HAND SURGERY Right   . SHOULDER SURGERY Right       Social History:     Social History  Substance Use Topics  . Smoking status: Never Smoker  . Smokeless tobacco: Never Used  . Alcohol use No     Lives - at home  Mobility - walks by self   Family History :     Family History  Problem Relation Age of Onset  . Hypertension Mother   . Arrhythmia Mother   . Thyroid disease  Mother   . Pneumonia Father   . Diabetes Maternal Grandmother   . Kidney disease Sister   . Multiple sclerosis Cousin    No family history of vertigo.    Home Medications:   Prior to Admission medications   Medication Sig Start Date End Date Taking? Authorizing Provider  albuterol (PROVENTIL HFA;VENTOLIN HFA) 108 (90 BASE) MCG/ACT inhaler Inhale 2 puffs into the lungs every 6 (six) hours as needed for wheezing or shortness of breath. 10/24/14  Yes Gregor Hams, MD  amitriptyline (ELAVIL) 25 MG tablet Take 25 mg by mouth at bedtime. 04/23/15  Yes [provider]  citalopram (CELEXA) 10 MG tablet Take 1 tablet (10 mg total) by mouth daily. 05/03/15  Yes Marcial Pacas, MD  diclofenac (VOLTAREN) 75 MG EC tablet Take 75 mg by mouth 2 (two) times daily.   Yes [provider]  esomeprazole (NEXIUM) 40 MG capsule Take 40 mg by mouth daily before breakfast.   Yes [provider]  fluticasone (FLONASE) 50 MCG/ACT nasal spray Place 2 sprays into both nostrils daily. Patient taking differently: Place 2 sprays into both nostrils daily as needed for allergies or rhinitis.  10/24/14  Yes Gregor Hams, MD  ibuprofen (ADVIL,MOTRIN) 800 MG tablet Take 1 tablet (800 mg total) by mouth every 8 (eight) hours as needed for  mild pain or moderate pain (pain.). Patient taking differently: Take 800 mg by mouth every 8 (eight) hours as needed (for pain).  09/29/15  Yes Antonietta Breach, PA-C  ondansetron (ZOFRAN ODT) 4 MG disintegrating tablet Take 1 tablet (4 mg total) by mouth every 8 (eight) hours as needed. Patient taking differently: Take 4 mg by mouth every 8 (eight) hours as needed for nausea or vomiting.  04/23/17  Yes Marcial Pacas, MD  pregabalin (LYRICA) 150 MG capsule Take 150 mg by mouth 2 (two) times daily.    Yes [provider]  rizatriptan (MAXALT-MLT) 10 MG disintegrating tablet Take 1 tablet (10 mg total) by mouth as needed. May repeat in 2 hours if needed Patient taking differently: Take 10 mg by mouth once as needed for migraine. May repeat in 2 hours if no relief 04/23/17  Yes Marcial Pacas, MD  Spacer/Aero-Holding Chambers (AEROCHAMBER PLUS FLO-VU LARGE) MISC 1 each by Other route once. 09/23/13  Yes Baker, Zachary H, PA-C  sucralfate (CARAFATE) 1 G tablet Take 1 tablet (1 g total) by mouth 4 (four) times daily. Patient taking differently: Take 1 g by mouth every morning.  07/11/12  Yes Malvin Johns, MD  tiZANidine (ZANAFLEX) 4 MG tablet Take 4 mg by mouth every 8 (eight) hours as needed for muscle spasms.  04/15/17  Yes [provider]  zonisamide (ZONEGRAN) 100 MG capsule Take 1 capsule (100 mg total) by mouth 2 (two) times daily. 04/23/17  Yes Marcial Pacas, MD     Allergies:    No Known Allergies   Physical Exam:   Vitals  Blood pressure 120/73, pulse 63, temperature 98.5 F (36.9 C), temperature source Oral, resp. rate 16, SpO2 100 %.   1. General  lying in bed in NAD,    2. Normal affect and insight, Not Suicidal or Homicidal, Awake Alert, Oriented X 3.  3. No F.N deficits, ALL C.Nerves Intact, Strength 5/5 all 4 extremities, Sensation intact all 4 extremities, Plantars down going.  4. Ears and Eyes appear Normal, Conjunctivae clear, PERRLA. Moist Oral Mucosa.  5. Supple Neck,  No JVD, No cervical lymphadenopathy appriciated, No Carotid Bruits.  6. Symmetrical Chest wall movement, Good air movement bilaterally, CTAB.  7. RRR, No Gallops, Rubs or Murmurs, No Parasternal Heave.  8. Positive Bowel Sounds, Abdomen Soft, No tenderness, No organomegaly appriciated,No rebound -guarding or rigidity.  9.  No Cyanosis, Normal Skin Turgor, No Skin Rash or Bruise.  10. Good muscle tone,  joints appear normal , no effusions, Normal ROM.  11. No Palpable Lymph Nodes in Neck or Axillae   Good finger to nose,  No pronator drift,  Good grip,  No difference in strength, palpable     Data Review:    CBC  Recent Labs Lab 05/07/17 1845 05/07/17 1855  WBC 4.0  --   HGB 11.5* 13.9  HCT 35.2* 41.0  PLT 306  --   MCV 75.1*  --   MCH 24.5*  --   MCHC 32.7  --   RDW 14.7  --   LYMPHSABS 2.2  --   MONOABS 0.1  --   EOSABS 0.0  --   BASOSABS 0.0  --    ------------------------------------------------------------------------------------------------------------------  Chemistries   Recent Labs Lab 05/07/17 1845 05/07/17 1855  NA 139 142  K 3.3* 3.3*  CL 109 109  CO2 22  --   GLUCOSE 94 92  BUN 12 13  CREATININE 0.86 0.80  CALCIUM 9.2  --   AST 19  --   ALT 21  --   ALKPHOS 35*  --   BILITOT 0.7  --    ------------------------------------------------------------------------------------------------------------------ CrCl cannot be calculated (Unknown ideal weight.). ------------------------------------------------------------------------------------------------------------------ No results for input(s): TSH, T4TOTAL, T3FREE, THYROIDAB in the last 72 hours.  Invalid input(s): FREET3  Coagulation profile  Recent Labs Lab 05/07/17 1845  INR 0.92   ------------------------------------------------------------------------------------------------------------------- No results for input(s): DDIMER in the last 72  hours. -------------------------------------------------------------------------------------------------------------------  Cardiac Enzymes No results for input(s): CKMB, TROPONINI, MYOGLOBIN in the last 168 hours.  Invalid input(s): CK ------------------------------------------------------------------------------------------------------------------ No results found for: BNP   ---------------------------------------------------------------------------------------------------------------  Urinalysis    Component Value Date/Time   COLORURINE YELLOW 06/30/2015 0025   APPEARANCEUR CLOUDY (A) 06/30/2015 0025   LABSPEC 1.025 06/30/2015 0025   PHURINE 5.5 06/30/2015 0025   GLUCOSEU NEGATIVE 06/30/2015 0025   HGBUR NEGATIVE 06/30/2015 0025   BILIRUBINUR NEGATIVE 06/30/2015 0025   KETONESUR NEGATIVE 06/30/2015 0025   PROTEINUR NEGATIVE 06/30/2015 0025   UROBILINOGEN 1.0 11/16/2013 1957   NITRITE NEGATIVE 06/30/2015 0025   LEUKOCYTESUR NEGATIVE 06/30/2015 0025    ----------------------------------------------------------------------------------------------------------------   Imaging Results:    Ct Head Wo Contrast  Result Date: 05/07/2017 CLINICAL DATA:  Extreme dizziness that began earlier today. EXAM: CT HEAD WITHOUT CONTRAST TECHNIQUE: Contiguous axial images were obtained from the base of the skull through the vertex without intravenous contrast. COMPARISON:  11/25/2016. FINDINGS: Brain: No evidence of acute infarction, hemorrhage, hydrocephalus, extra-axial collection or mass lesion/mass effect. Vascular: No hyperdense vessel or unexpected calcification. Skull: Normal. Negative for fracture or focal lesion. Sinuses/Orbits: No acute finding. Other: None. IMPRESSION: Negative exam.  No change from priors. Electronically Signed   By: Staci Righter M.D.   On: 05/07/2017 19:23    Assessment & Plan:    Principal Problem:   Leg weakness Active Problems:   Hypokalemia    Anemia    Vertigo  ? Bpv MRI brain => negative for CVA Appreciate neurology inpu  Left arm and  leg weakness  ? TIA No neck or back pain Check cpk, esr, tsh, ana,  Check hga1c, lipid Check MRI C spine Carotid ultrasound Cardiac  echo Appreciate neurology input Aspirin 381m po qday Lipitor 841mpo qhs  Hypokalemia repelete Check cmp in am  Anemia Check cbc in am     DVT Prophylaxis   SCDs  AM Labs Ordered, also please review Full Orders  Family Communication: Admission, patients condition and plan of care including tests being ordered have been discussed with the patient  who indicate understanding and agree with the plan and Code Status.  Code Status FULL CODE  Likely DC to  home  Condition GUARDED    Consults called: neurology by ED  Admission status: observation  Time spent in minutes : 45 minutes   JaJani Gravel.D on 05/07/2017 at 9:29 PM  Between 7am to 7pm - Pager - [phone] 7pm go to [email or web address] - password [email or web address] - Office  [phone]

## 2017-05-07 NOTE — ED Triage Notes (Signed)
Pt to ER brought by friend for stroke like symptoms starting at 11:30 am this morning consisting of slurred speech, dragging left foot, and dizziness. At current time patient exhibits no weakness, has clear speech, only complaint is dizziness. Pupils equal round and reactive. States headache 8/10. CBG 86.

## 2017-05-08 ENCOUNTER — Observation Stay (HOSPITAL_BASED_OUTPATIENT_CLINIC_OR_DEPARTMENT_OTHER): Payer: Medicare Other

## 2017-05-08 ENCOUNTER — Observation Stay (HOSPITAL_COMMUNITY): Payer: Medicare Other

## 2017-05-08 DIAGNOSIS — R531 Weakness: Secondary | ICD-10-CM | POA: Diagnosis not present

## 2017-05-08 DIAGNOSIS — R2990 Unspecified symptoms and signs involving the nervous system: Secondary | ICD-10-CM | POA: Diagnosis not present

## 2017-05-08 DIAGNOSIS — E876 Hypokalemia: Secondary | ICD-10-CM | POA: Diagnosis not present

## 2017-05-08 DIAGNOSIS — D649 Anemia, unspecified: Secondary | ICD-10-CM | POA: Diagnosis not present

## 2017-05-08 DIAGNOSIS — R29898 Other symptoms and signs involving the musculoskeletal system: Secondary | ICD-10-CM | POA: Diagnosis not present

## 2017-05-08 DIAGNOSIS — G459 Transient cerebral ischemic attack, unspecified: Secondary | ICD-10-CM | POA: Diagnosis not present

## 2017-05-08 DIAGNOSIS — R42 Dizziness and giddiness: Secondary | ICD-10-CM

## 2017-05-08 LAB — HIV ANTIBODY (ROUTINE TESTING W REFLEX): HIV Screen 4th Generation wRfx: NONREACTIVE

## 2017-05-08 LAB — LIPID PANEL
CHOL/HDL RATIO: 3.1 ratio
Cholesterol: 153 mg/dL (ref 0–200)
HDL: 49 mg/dL (ref >40)
LDL Cholesterol: 90 mg/dL (ref 0–99)
TRIGLYCERIDES: 68 mg/dL (ref <150)
VLDL: 14 mg/dL (ref 0–40)

## 2017-05-08 LAB — COMPREHENSIVE METABOLIC PANEL
ALBUMIN: 3.4 g/dL — AB (ref 3.5–5.0)
ALK PHOS: 31 U/L — AB (ref 38–126)
ALT: 19 U/L (ref 14–54)
AST: 16 U/L (ref 15–41)
Anion gap: 4 — ABNORMAL LOW (ref 5–15)
BILIRUBIN TOTAL: 0.7 mg/dL (ref 0.3–1.2)
BUN: 11 mg/dL (ref 6–20)
CALCIUM: 8.5 mg/dL — AB (ref 8.9–10.3)
CO2: 21 mmol/L — AB (ref 22–32)
CREATININE: 0.82 mg/dL (ref 0.44–1.00)
Chloride: 111 mmol/L (ref 101–111)
GFR calc Af Amer: >60 mL/min (ref >60)
GFR calc non Af Amer: >60 mL/min (ref >60)
GLUCOSE: 102 mg/dL — AB (ref 65–99)
Potassium: 3.5 mmol/L (ref 3.5–5.1)
SODIUM: 136 mmol/L (ref 135–145)
TOTAL PROTEIN: 6.2 g/dL — AB (ref 6.5–8.1)

## 2017-05-08 LAB — CBC
HCT: 35.2 % — ABNORMAL LOW (ref 36.0–46.0)
HEMOGLOBIN: 11.3 g/dL — AB (ref 12.0–15.0)
MCH: 24.4 pg — AB (ref 26.0–34.0)
MCHC: 32.1 g/dL (ref 30.0–36.0)
MCV: 75.9 fL — ABNORMAL LOW (ref 78.0–100.0)
Platelets: 274 10*3/uL (ref 150–400)
RBC: 4.64 MIL/uL (ref 3.87–5.11)
RDW: 14.5 % (ref 11.5–15.5)
WBC: 5 10*3/uL (ref 4.0–10.5)

## 2017-05-08 LAB — TSH: TSH: 0.891 u[IU]/mL (ref 0.350–4.500)

## 2017-05-08 LAB — HEMOGLOBIN A1C
HEMOGLOBIN A1C: 5.8 % — AB (ref 4.8–5.6)
MEAN PLASMA GLUCOSE: 119.76 mg/dL

## 2017-05-08 LAB — SEDIMENTATION RATE: SED RATE: 4 mm/h (ref 0–22)

## 2017-05-08 LAB — ECHOCARDIOGRAM COMPLETE
Height: 62 in
Weight: 2048 oz

## 2017-05-08 LAB — CK TOTAL AND CKMB (NOT AT ARMC)
CK, MB: 1.2 ng/mL (ref 0.5–5.0)
RELATIVE INDEX: INVALID (ref 0.0–2.5)
Total CK: 99 U/L (ref 38–234)

## 2017-05-08 NOTE — Care Management Obs Status (Signed)
[facility] NOTIFICATION   Patient Details  Name: GLORIS SHIROMA MRN: [id number] Date of Birth: January 23, 1966   Medicare Observation Status Notification Given:  Yes    Pollie Friar, RN 05/08/2017, 2:19 PM

## 2017-05-08 NOTE — Progress Notes (Signed)
*  PRELIMINARY RESULTS* Vascular Ultrasound Carotid Duplex has been completed.  Preliminary findings: Bilateral: No significant (1-39%) ICA stenosis. Antegrade vertebral flow.   Landry Mellow, RDMS, RVT  05/08/2017, 4:55 PM

## 2017-05-08 NOTE — ED Provider Notes (Signed)
[facility] DEPT Provider Note   CSN: [id number] Arrival date & time: 05/07/17  2355     History   Chief Complaint Chief Complaint  Patient presents with  . Stroke Symptoms    HPI ALITHEA LAPAGE is a 51 y.o. female.  HPI Patient presents to the emergency department with an onset of slurred speech, weakness in the left leg and foot and vertiginous symptoms that started around 11:30.  The patient states that she still having the dizziness along with the leg weakness.  He feels that her speech is better.  Patient states that she has never had these types of symptoms in the past.  She states nothing seems make her condition better or worseThe patient denies chest pain, shortness of breath, headache,blurred vision, neck pain, fever, cough, numbness, anorexia, edema, abdominal pain, nausea, vomiting, diarrhea, rash, back pain, dysuria, hematemesis, bloody stool, near syncope, or syncope. Past Medical History:  Diagnosis Date  . Acid reflux   . Asthma   . Disturbance of skin sensation   . Fibromyalgia   . Migraine   . Uterine fibroid     Patient Active Problem List   Diagnosis Date Noted  . Leg weakness 05/07/2017  . Hypokalemia 05/07/2017  . Anemia 05/07/2017  . Vertigo 05/07/2017  . Chronic migraine 04/23/2017  . Paresthesia 02/07/2016  . ABDOMINAL BLOATING 11/08/2008  . EPIGASTRIC PAIN 11/08/2008  . DEPRESSION 10/31/2008  . OSTEOARTHRITIS 10/31/2008  . FIBROMYALGIA 10/31/2008  . HEADACHE, CHRONIC 10/31/2008  . ABDOMINAL PAIN, RECURRENT 10/31/2008  . CARPAL TUNNEL SYNDROME, HX OF 10/31/2008    Past Surgical History:  Procedure Laterality Date  . CARPAL TUNNEL RELEASE    . CYSTECTOMY    . HAND SURGERY Right   . SHOULDER SURGERY Right     OB History    No data available       Home Medications    Prior to Admission medications   Medication Sig Start Date End Date Taking? Authorizing Provider  albuterol (PROVENTIL HFA;VENTOLIN HFA) 108 (90 BASE) MCG/ACT  inhaler Inhale 2 puffs into the lungs every 6 (six) hours as needed for wheezing or shortness of breath. 10/24/14  Yes Gregor Hams, MD  amitriptyline (ELAVIL) 25 MG tablet Take 25 mg by mouth at bedtime. 04/23/15  Yes [provider]  citalopram (CELEXA) 10 MG tablet Take 1 tablet (10 mg total) by mouth daily. 05/03/15  Yes Marcial Pacas, MD  diclofenac (VOLTAREN) 75 MG EC tablet Take 75 mg by mouth 2 (two) times daily.   Yes [provider]  esomeprazole (NEXIUM) 40 MG capsule Take 40 mg by mouth daily before breakfast.   Yes [provider]  fluticasone (FLONASE) 50 MCG/ACT nasal spray Place 2 sprays into both nostrils daily. Patient taking differently: Place 2 sprays into both nostrils daily as needed for allergies or rhinitis.  10/24/14  Yes Gregor Hams, MD  ibuprofen (ADVIL,MOTRIN) 800 MG tablet Take 1 tablet (800 mg total) by mouth every 8 (eight) hours as needed for mild pain or moderate pain (pain.). Patient taking differently: Take 800 mg by mouth every 8 (eight) hours as needed (for pain).  09/29/15  Yes Antonietta Breach, PA-C  ondansetron (ZOFRAN ODT) 4 MG disintegrating tablet Take 1 tablet (4 mg total) by mouth every 8 (eight) hours as needed. Patient taking differently: Take 4 mg by mouth every 8 (eight) hours as needed for nausea or vomiting.  04/23/17  Yes Marcial Pacas, MD  pregabalin (LYRICA) 150 MG capsule Take  150 mg by mouth 2 (two) times daily.    Yes [provider]  rizatriptan (MAXALT-MLT) 10 MG disintegrating tablet Take 1 tablet (10 mg total) by mouth as needed. May repeat in 2 hours if needed Patient taking differently: Take 10 mg by mouth once as needed for migraine. May repeat in 2 hours if no relief 04/23/17  Yes Marcial Pacas, MD  Spacer/Aero-Holding Chambers (AEROCHAMBER PLUS FLO-VU LARGE) MISC 1 each by Other route once. 09/23/13  Yes Baker, Zachary H, PA-C  sucralfate (CARAFATE) 1 G tablet Take 1 tablet (1 g total) by mouth 4 (four) times  daily. Patient taking differently: Take 1 g by mouth every morning.  07/11/12  Yes Malvin Johns, MD  tiZANidine (ZANAFLEX) 4 MG tablet Take 4 mg by mouth every 8 (eight) hours as needed for muscle spasms.  04/15/17  Yes [provider]  zonisamide (ZONEGRAN) 100 MG capsule Take 1 capsule (100 mg total) by mouth 2 (two) times daily. 04/23/17  Yes Marcial Pacas, MD    Family History Family History  Problem Relation Age of Onset  . Hypertension Mother   . Arrhythmia Mother   . Thyroid disease Mother   . Pneumonia Father   . Diabetes Maternal Grandmother   . Kidney disease Sister   . Multiple sclerosis Cousin     Social History Social History  Substance Use Topics  . Smoking status: Never Smoker  . Smokeless tobacco: Never Used  . Alcohol use No     Allergies   Patient has no known allergies.   Review of Systems Review of Systems All other systems negative except as documented in the HPI. All pertinent positives and negatives as reviewed in the HPI.  Physical Exam Updated Vital Signs BP (!) 143/89 (BP Location: Right Arm)   Pulse 72   Temp 98.8 F (37.1 C) (Oral)   Resp 16   Ht 5\' 2"  (1.575 m)   Wt 58.1 kg (128 lb)   SpO2 100%   BMI 23.41 kg/m   Physical Exam  Constitutional: She is oriented to person, place, and time. She appears well-developed and well-nourished. No distress.  HENT:  Head: Normocephalic and atraumatic.  Mouth/Throat: Oropharynx is clear and moist.  Eyes: Pupils are equal, round, and reactive to light.  Neck: Normal range of motion. Neck supple.  Cardiovascular: Normal rate, regular rhythm and normal heart sounds.  Exam reveals no gallop and no friction rub.   No murmur heard. Pulmonary/Chest: Effort normal and breath sounds normal. No respiratory distress. She has no wheezes.  Abdominal: Soft. Bowel sounds are normal. She exhibits no distension. There is no tenderness.  Neurological: She is alert and oriented to person, place, and time.  No sensory deficit. She exhibits normal muscle tone. Gait abnormal. Coordination normal. GCS eye subscore is 4. GCS verbal subscore is 5. GCS motor subscore is 6.  She does have weakness in the left leg compared to the right.  She has 3 out of 5 strength  Skin: Skin is warm and dry. Capillary refill takes less than 2 seconds. No rash noted. No erythema.  Psychiatric: She has a normal mood and affect. Her behavior is normal.  Nursing note and vitals reviewed.    ED Treatments / Results  Labs (all labs ordered are listed, but only abnormal results are displayed) Labs Reviewed  CBC - Abnormal; Notable for the following:       Result Value   Hemoglobin 11.5 (*)    HCT 35.2 (*)  MCV 75.1 (*)    MCH 24.5 (*)    All other components within normal limits  DIFFERENTIAL - Abnormal; Notable for the following:    Neutro Abs 1.6 (*)    All other components within normal limits  COMPREHENSIVE METABOLIC PANEL - Abnormal; Notable for the following:    Potassium 3.3 (*)    Alkaline Phosphatase 35 (*)    All other components within normal limits  I-STAT CHEM 8, ED - Abnormal; Notable for the following:    Potassium 3.3 (*)    All other components within normal limits  PROTIME-INR  APTT  SEDIMENTATION RATE  TSH  CK TOTAL AND CKMB (NOT AT [facility])  HIV ANTIBODY (ROUTINE TESTING)  HEMOGLOBIN A1C  LIPID PANEL  COMPREHENSIVE METABOLIC PANEL  CBC  ANTINUCLEAR ANTIBODIES, IFA  I-STAT TROPONIN, ED  CBG MONITORING, ED    EKG  EKG Interpretation None       Radiology Dg Chest 2 View  Result Date: 05/08/2017 CLINICAL DATA:  Weakness today. EXAM: CHEST  2 VIEW COMPARISON:  Chest radiograph June 29, 2015 FINDINGS: Cardiomediastinal silhouette is normal. No pleural effusions or focal consolidations. Trachea projects midline and there is no pneumothorax. Soft tissue planes and included osseous structures are non-suspicious. Mild degenerative change of thoracic spine. IMPRESSION: Stable  negative chest. Electronically Signed   By: Elon Alas M.D.   On: 05/08/2017 00:49   Ct Head Wo Contrast  Result Date: 05/07/2017 CLINICAL DATA:  Extreme dizziness that began earlier today. EXAM: CT HEAD WITHOUT CONTRAST TECHNIQUE: Contiguous axial images were obtained from the base of the skull through the vertex without intravenous contrast. COMPARISON:  11/25/2016. FINDINGS: Brain: No evidence of acute infarction, hemorrhage, hydrocephalus, extra-axial collection or mass lesion/mass effect. Vascular: No hyperdense vessel or unexpected calcification. Skull: Normal. Negative for fracture or focal lesion. Sinuses/Orbits: No acute finding. Other: None. IMPRESSION: Negative exam.  No change from priors. Electronically Signed   By: Staci Righter M.D.   On: 05/07/2017 19:23   Mr Brain Wo Contrast  Result Date: 05/07/2017 CLINICAL DATA:  Initial evaluation for acute slurred speech, dizziness, tract in left foot. Suspect stroke. EXAM: MRI HEAD WITHOUT CONTRAST TECHNIQUE: Multiplanar, multiecho pulse sequences of the brain and surrounding structures were obtained without intravenous contrast. COMPARISON:  Prior CT from earlier the same day. FINDINGS: Brain: Cerebral volume normal. No significant cerebral white matter disease for age. No abnormal foci of restricted diffusion to suggest acute or subacute ischemia. Gray-white matter differentiation maintained. No evidence for chronic infarction. No evidence for acute or chronic intracranial hemorrhage. No mass lesion, midline shift or mass effect. No hydrocephalus. No extra-axial fluid collection. Major dural sinuses are grossly patent. Pituitary gland suprasellar region normal. Midline structures intact and normal. Vascular: Major intracranial vascular flow voids are maintained. Skull and upper cervical spine: Craniocervical junction normal. Upper cervical spine within normal limits. Bone marrow signal intensity normal. Scalp soft tissues. Sinuses/Orbits:  Globes and orbital soft tissues normal. Paranasal sinuses clear. No mastoid effusion. Inner ear structures normal. Other: None. IMPRESSION: Normal brain MRI.  No acute intracranial abnormality identified. Electronically Signed   By: Jeannine Boga M.D.   On: 05/07/2017 21:36    Procedures Procedures (including critical care time)  Medications Ordered in ED Medications  tiZANidine (ZANAFLEX) tablet 4 mg (not administered)  ondansetron (ZOFRAN-ODT) disintegrating tablet 4 mg (not administered)  SUMAtriptan (IMITREX) tablet 100 mg (not administered)  zonisamide (ZONEGRAN) capsule 100 mg (100 mg Oral Given 05/07/17 2353)  amitriptyline (ELAVIL) tablet  25 mg (25 mg Oral Given 05/07/17 2338)  citalopram (CELEXA) tablet 10 mg (not administered)  pregabalin (LYRICA) capsule 150 mg (150 mg Oral Given 05/07/17 2339)  albuterol (PROVENTIL) (2.5 MG/3ML) 0.083% nebulizer solution 3 mL (not administered)  fluticasone (FLONASE) 50 MCG/ACT nasal spray 2 spray (not administered)  sucralfate (CARAFATE) tablet 1 g (not administered)  diclofenac (VOLTAREN) EC tablet 75 mg (75 mg Oral Given 05/07/17 2339)  pantoprazole (PROTONIX) EC tablet 40 mg (not administered)  0.9 %  sodium chloride infusion ( Intravenous New Bag/Given 05/07/17 2343)  acetaminophen (TYLENOL) tablet 650 mg (not administered)    Or  acetaminophen (TYLENOL) solution 650 mg (not administered)    Or  acetaminophen (TYLENOL) suppository 650 mg (not administered)  enoxaparin (LOVENOX) injection 40 mg (40 mg Subcutaneous Given 05/07/17 2339)  aspirin suppository 300 mg (not administered)    Or  aspirin tablet 325 mg (not administered)  atorvastatin (LIPITOR) tablet 80 mg (not administered)  meclizine (ANTIVERT) tablet 25 mg (not administered)  potassium chloride SA (K-DUR,KLOR-CON) CR tablet 20 mEq (20 mEq Oral Given 05/07/17 2232)   stroke: mapping our early stages of recovery book (1 each Does not apply Given 05/07/17 2343)   prochlorperazine (COMPAZINE) injection 10 mg (10 mg Intravenous Given 05/07/17 2340)  diphenhydrAMINE (BENADRYL) injection 25 mg (25 mg Intravenous Given 05/07/17 2339)     Initial Impression / Assessment and Plan / ED Course  I have reviewed the triage vital signs and the nursing notes.  Pertinent labs & imaging results that were available during my care of the patient were reviewed by me and considered in my medical decision making (see chart for details).     Spoke with neurology, along with the [facility].  The patient will be admitted for further evaluation of these symptoms.  Minimum she may have TIA-type symptoms.  The patient is advised of the plan and all questions were answered  Final Clinical Impressions(s) / ED Diagnoses   Final diagnoses:  Stroke-like symptoms    New Prescriptions Current Discharge Medication List       Dalia Heading, PA-C 05/08/17 0110    Tegeler, Gwenyth Allegra, MD 05/08/17 1018

## 2017-05-08 NOTE — Evaluation (Signed)
Physical Therapy Evaluation Patient Details Name: Kerry Ortiz MRN: [id number] DOB: [birth year] Today's Date: 05/08/2017   History of Present Illness  Pt is a 51 y.o. female presenting with dizziness an LLE weakness. PMHx: migraines, fibromyalgia, back pain. MRI on 10/4 negative for acute abnormality.  Clinical Impression  Pt admitted with/for complications above.  Pt showed s/s of L BPPV which was treated.  Pt will very guarded, experiencing s/s of vertigo.  Will followup on Saturday for another intervention as necessary.  Pt currently limited functionally due to the problems listed below.  (see problems list.)  Pt will benefit from PT to maximize function and safety to be able to get home safely with available assist .     Follow Up Recommendations Home health PT;Other (comment) (vestibular PT)    Equipment Recommendations  None recommended by PT    Recommendations for Other Services       Precautions / Restrictions Precautions Precautions: Fall Restrictions Weight Bearing Restrictions: No      Mobility  Bed Mobility Overal bed mobility: Modified Independent             General bed mobility comments: Increased time, no physical assist  Transfers Overall transfer level: Needs assistance Equipment used: None Transfers: Sit to/from Stand Sit to Stand: Min guard         General transfer comment: for safety, mild unsteadiness but no LOB with mobility  Ambulation/Gait Ambulation/Gait assistance: Min guard Ambulation Distance (Feet): 100 Feet Assistive device:  (occasional rail) Gait Pattern/deviations: Step-through pattern   Gait velocity interpretation: Below normal speed for age/gender General Gait Details: Episodes of mild unsteadiness, min to moderate drift to left with scanning, Guarded in general.  Stairs            Wheelchair Mobility    Modified Rankin (Stroke Patients Only)       Balance Overall balance assessment: Needs  assistance Sitting-balance support: Feet supported;No upper extremity supported Sitting balance-Leahy Scale: Good     Standing balance support: No upper extremity supported;During functional activity Standing balance-Leahy Scale: Fair                               Pertinent Vitals/Pain Pain Assessment: Faces Faces Pain Scale: No hurt Pain Location: HA Pain Descriptors / Indicators: Aching Pain Intervention(s): Monitored during session    Home Living Family/patient expects to be discharged to:: Private residence Living Arrangements: Alone Available Help at Discharge: Family;Friend(s);Available PRN/intermittently Type of Home: House       Home Layout: Two level;Bed/bath upstairs Home Equipment: Shower seat Additional Comments: Could stay at her mothers house upon d/c for a few days if needed.    Prior Function Level of Independence: Independent               Hand Dominance   Dominant Hand: Right    Extremity/Trunk Assessment   Upper Extremity Assessment Upper Extremity Assessment: Defer to OT evaluation    Lower Extremity Assessment Lower Extremity Assessment: Overall WFL for tasks assessed    Cervical / Trunk Assessment Cervical / Trunk Assessment: Normal  Communication   Communication: No difficulties  Cognition Arousal/Alertness: Awake/alert Behavior During Therapy: Flat affect Overall Cognitive Status: Impaired/Different from baseline                                        General  Comments General comments (skin integrity, edema, etc.): Basic Vestibular Assessment.  Based on answers to questions, started with testing for BPPV.  Transitions between testing produced s/s of spinning without nystagmus.  these periods lasted longer than expected.  Hallpike-Dix R--negative for nystagmus and mild s/s.  Hallpike-Dix L postive for torsional nystagmus and more moderate s/s.  Treated L post BPPV with EPPLY.  Discussed compensatory  strategies to allow pt to tolerate ambulation in the halls.  Have set pt up for vestibular check on Sat.    Exercises     Assessment/Plan    PT Assessment Patient needs continued PT services  PT Problem List Other (comment);Decreased balance (vertigo)       PT Treatment Interventions Gait training;Functional mobility training;Other (comment) (vestibular treatment)    PT Goals (Current goals can be found in the Care Plan section)  Acute Rehab PT Goals Patient Stated Goal: get better PT Goal Formulation: With patient Time For Goal Achievement: 05/15/17 Potential to Achieve Goals: Good    Frequency Min 3X/week   Barriers to discharge        Co-evaluation               AM-PAC PT "6 Clicks" Daily Activity  Outcome Measure Difficulty turning over in bed (including adjusting bedclothes, sheets and blankets)?: A Little Difficulty moving from lying on back to sitting on the side of the bed? : A Little Difficulty sitting down on and standing up from a chair with arms (e.g., wheelchair, bedside commode, etc,.)?: A Little Help needed moving to and from a bed to chair (including a wheelchair)?: A Little Help needed walking in hospital room?: A Little Help needed climbing 3-5 steps with a railing? : A Little 6 Click Score: 18    End of Session   Activity Tolerance: Patient tolerated treatment well Patient left: in bed;with call bell/phone within reach Nurse Communication: Mobility status PT Visit Diagnosis: BPPV;Other (comment) (vertigo) BPPV - Right/Left : Left    Time: 3546-5681 PT Time Calculation (min) (ACUTE ONLY): 33 min   Charges:   PT Evaluation $PT Eval Moderate Complexity: 1 Mod PT Treatments $Neuromuscular Re-education: 8-22 mins   PT G Codes:   PT G-Codes **NOT FOR INPATIENT CLASS** Functional Assessment Tool Used: AM-PAC 6 Clicks Basic Mobility;Clinical judgement Functional Limitation: Mobility: Walking and moving around Mobility: Walking and Moving  Around Current Status (E7517): At least 1 percent but less than 20 percent impaired, limited or restricted Mobility: Walking and Moving Around Goal Status [id number]): 0 percent impaired, limited or restricted    05/08/2017  Donnella Sham, PT [phone]  (pager)  Tessie Fass Jaz Mallick 05/08/2017, 4:45 PM

## 2017-05-08 NOTE — Evaluation (Signed)
Speech Language Pathology Evaluation Patient Details Name: Kerry Ortiz MRN: [id number] DOB: [birth year] Today's Date: 05/08/2017 Time: 5809-9833 SLP Time Calculation (min) (ACUTE ONLY): 12 min  Problem List:  Patient Active Problem List   Diagnosis Date Noted  . Leg weakness 05/07/2017  . Hypokalemia 05/07/2017  . Anemia 05/07/2017  . Vertigo 05/07/2017  . Chronic migraine 04/23/2017  . Paresthesia 02/07/2016  . ABDOMINAL BLOATING 11/08/2008  . EPIGASTRIC PAIN 11/08/2008  . DEPRESSION 10/31/2008  . OSTEOARTHRITIS 10/31/2008  . FIBROMYALGIA 10/31/2008  . HEADACHE, CHRONIC 10/31/2008  . ABDOMINAL PAIN, RECURRENT 10/31/2008  . CARPAL TUNNEL SYNDROME, HX OF 10/31/2008   Past Medical History:  Past Medical History:  Diagnosis Date  . Acid reflux   . Asthma   . Disturbance of skin sensation   . Fibromyalgia   . Migraine   . Uterine fibroid    Past Surgical History:  Past Surgical History:  Procedure Laterality Date  . CARPAL TUNNEL RELEASE    . CYSTECTOMY    . HAND SURGERY Right   . SHOULDER SURGERY Right    HPI:  Pt is a 51 y.o. female presenting with dizziness an LLE weakness. PMHx: migraines, fibromyalgia, back pain. MRI on 10/4 negative for acute abnormality.   Assessment / Plan / Recommendation Clinical Impression  Patient presents with flat affect and cognitive communication deficits in attention, executive function, and naming. Began administration of MOCA form 7.1, which was not completed as pt was taken to vascular lab partway through the assessment. Pt demonstrated slow processing, reduced awareness of errors in trailmaking, figure copy, clock drawing and reverse digit repetition. Naming 2/3; pt initiated 'rhin-" but was unable to state the remainder of the word rhinoceros without phonemic cues from SLP. Immediate recall 4/5 words in 2 trials. Current imaging negative, however recommend skilled ST for further evaluation of cognitive-linguistic deficits as well as  intervention to maximize pt's cognition for safe d/c. Will follow acutely.     SLP Assessment  SLP Recommendation/Assessment: Patient needs continued Speech Lanaguage Pathology Services SLP Visit Diagnosis: Cognitive communication deficit (R41.841)    Follow Up Recommendations  Other (comment) (TBD)    Frequency and Duration min 1 x/week  1 week      SLP Evaluation Cognition  Overall Cognitive Status: Impaired/Different from baseline Arousal/Alertness: Awake/alert Orientation Level: Oriented X4 Attention: Focused;Sustained Focused Attention: Appears intact Sustained Attention: Impaired (digit repetition 1/2) Sustained Attention Impairment: Verbal basic;Functional basic Memory: Impaired Memory Impairment: Storage deficit (immediate recall 4/5) Awareness: Impaired Awareness Impairment: Intellectual impairment Executive Function: Reasoning;Sequencing;Organizing;Self Monitoring;Self Correcting Reasoning: Impaired Reasoning Impairment: Verbal basic;Functional basic Sequencing: Impaired Sequencing Impairment: Verbal basic;Functional basic Organizing: Impaired Organizing Impairment: Verbal basic;Functional basic (difficulties with trailmaking, clock drawing) Self Monitoring: Impaired Self Monitoring Impairment: Verbal basic;Functional basic Self Correcting: Impaired Self Correcting Impairment: Verbal basic;Functional basic Behaviors:  (flat affect) Safety/Judgment: Other (comment) (needs further assessment)       Comprehension  Auditory Comprehension Overall Auditory Comprehension: Appears within functional limits for tasks assessed Visual Recognition/Discrimination Discrimination: Not tested Reading Comprehension Reading Status: Not tested    Expression Expression Primary Mode of Expression: Verbal Verbal Expression Overall Verbal Expression: Impaired Initiation: No impairment Level of Generative/Spontaneous Verbalization: Phrase (responses to questions are phrases,  word level) Naming:  (naming 2/3 animals (rhin- / rhinoceros)) Pragmatics: Impairment Impairments: Abnormal affect;Dysprosody;Eye contact;Monotone Effective Techniques: Open ended questions Non-Verbal Means of Communication: Not applicable Written Expression Dominant Hand: Right Written Expression: Not tested   Oral / Motor  Oral Motor/Sensory Function Overall Oral Motor/Sensory  Function: Within functional limits Motor Speech Overall Motor Speech: Appears within functional limits for tasks assessed Phonation: Low vocal intensity Articulation: Within functional limitis Intelligibility: Intelligibility reduced (due to low vocal intensity/ affect vs motor speech deficit) Word: 75-100% accurate Phrase: 75-100% accurate Motor Planning: Witnin functional limits   GO          Functional Assessment Tool Used: skilled clinical judgment Functional Limitations: Attention Attention Current Status (M7615): At least 20 percent but less than 40 percent impaired, limited or restricted Attention Goal Status (H8343): At least 1 percent but less than 20 percent impaired, limited or restricted        Kerry Ortiz, Kerry Ortiz, Kerry Ortiz Pathologist [phone]  Kerry Ortiz 05/08/2017, 4:51 PM

## 2017-05-08 NOTE — Evaluation (Signed)
Occupational Therapy Evaluation Patient Details Name: Kerry Ortiz MRN: [id number] DOB: 05/05/1966 Today's Date: 05/08/2017    History of Present Illness Pt is a 51 y.o. female presenting with dizziness an LLE weakness. PMHx: migraines, fibromyalgia, back pain. MRI on 10/4 negative for acute abnormality.   Clinical Impression   Pt reports she was independent with ADL PTA. Currently pt requires min guard assist for ADL and functional mobility due to dizziness and balance deficits. Pt planning to d/c home alone with intermittent supervision from family/friends. Recommending HHOT for follow up to maximize independence and safety with ADL and functional mobility upon return home. Pt would benefit from continued skilled OT to address established goals.    Follow Up Recommendations  Home health OT    Equipment Recommendations  None recommended by OT    Recommendations for Other Services       Precautions / Restrictions Precautions Precautions: Fall Restrictions Weight Bearing Restrictions: No      Mobility Bed Mobility Overal bed mobility: Modified Independent             General bed mobility comments: Increased time, no physical assist  Transfers Overall transfer level: Needs assistance Equipment used: None Transfers: Sit to/from Stand Sit to Stand: Min guard         General transfer comment: for safety, mild unsteadiness but no LOB with mobility    Balance Overall balance assessment: Needs assistance Sitting-balance support: Feet supported;No upper extremity supported Sitting balance-Leahy Scale: Good     Standing balance support: No upper extremity supported;During functional activity Standing balance-Leahy Scale: Fair                             ADL either performed or assessed with clinical judgement   ADL Overall ADL's : Needs assistance/impaired Eating/Feeding: Set up;Sitting   Grooming: Min guard;Standing   Upper Body Bathing: Set  up;Supervision/ safety;Sitting   Lower Body Bathing: Min guard;Sit to/from stand   Upper Body Dressing : Set up;Supervision/safety;Sitting   Lower Body Dressing: Min guard;Sit to/from stand   Toilet Transfer: Min guard;Ambulation Toilet Transfer Details (indicate cue type and reason): simulated by sit to stand from EOB with functional mobility     Tub/ Shower Transfer: Minimal assistance;Tub transfer;Ambulation;Shower Scientist, research (medical) Details (indicate cue type and reason): Recommended use of shower seat and holding onto wall for safety with transfers Functional mobility during ADLs: Min guard       Vision Baseline Vision/History: Wears glasses Wears Glasses: Distance only Patient Visual Report: No change from baseline Vision Assessment?: No apparent visual deficits Additional Comments: No nystagmus noted with movement     Perception     Praxis      Pertinent Vitals/Pain Pain Assessment: No/denies pain     Hand Dominance Right   Extremity/Trunk Assessment Upper Extremity Assessment Upper Extremity Assessment: Overall WFL for tasks assessed   Lower Extremity Assessment Lower Extremity Assessment: Defer to PT evaluation   Cervical / Trunk Assessment Cervical / Trunk Assessment: Normal   Communication Communication Communication: No difficulties   Cognition Arousal/Alertness: Awake/alert Behavior During Therapy: Flat affect Overall Cognitive Status: Within Functional Limits for tasks assessed                                     General Comments       Exercises     Shoulder Instructions  Home Living Family/patient expects to be discharged to:: Private residence Living Arrangements: Alone Available Help at Discharge: Family;Friend(s);Available PRN/intermittently Type of Home: House       Home Layout: Two level;Bed/bath upstairs     Bathroom Shower/Tub: Tub/shower unit;Curtain   Bathroom Toilet: Standard     Home  Equipment: Shower seat   Additional Comments: Could stay at her mothers house upon d/c for a few days if needed.      Prior Functioning/Environment Level of Independence: Independent                 OT Problem List: Decreased activity tolerance;Impaired balance (sitting and/or standing);Decreased knowledge of use of DME or AE      OT Treatment/Interventions: Self-care/ADL training;DME and/or AE instruction;Therapeutic activities;Patient/family education;Balance training    OT Goals(Current goals can be found in the care plan section) Acute Rehab OT Goals Patient Stated Goal: get better OT Goal Formulation: With patient Time For Goal Achievement: 05/22/17 Potential to Achieve Goals: Good ADL Goals Additional ADL Goal #1: Pt will gather ADL items and perform ADL with mod I. Additional ADL Goal #2: Pt will perform higher level balance task with mod I.  OT Frequency: Min 2X/week   Barriers to D/C: Decreased caregiver support  pt lives alone       Co-evaluation              AM-PAC PT "6 Clicks" Daily Activity     Outcome Measure Help from another person eating meals?: None Help from another person taking care of personal grooming?: A Little Help from another person toileting, which includes using toliet, bedpan, or urinal?: A Little Help from another person bathing (including washing, rinsing, drying)?: A Little Help from another person to put on and taking off regular upper body clothing?: A Little Help from another person to put on and taking off regular lower body clothing?: A Little 6 Click Score: 19   End of Session    Activity Tolerance: Patient tolerated treatment well Patient left: in bed;with call bell/phone within reach  OT Visit Diagnosis: Unsteadiness on feet (R26.81);Other abnormalities of gait and mobility (R26.89);Dizziness and giddiness (R42)                Time: 1550-1611 OT Time Calculation (min): 21 min Charges:  OT General Charges $OT  Visit: 1 Visit OT Evaluation $OT Eval Low Complexity: 1 Low G-Codes: OT G-codes **NOT FOR INPATIENT CLASS** Functional Assessment Tool Used: Clinical judgement Functional Limitation: Self care Self Care Current Status (C5852): At least 1 percent but less than 20 percent impaired, limited or restricted Self Care Goal Status (D7824): At least 1 percent but less than 20 percent impaired, limited or restricted   Mel Almond A. Ulice Brilliant, M.S., OTR/L Pager: [place] 05/08/2017, 4:19 PM

## 2017-05-08 NOTE — Progress Notes (Addendum)
Neurology progress note  Subjective Patient seen and examined. Says she feels better in terms of her dizziness. Describes her dizziness as room spinning No specific direction or movement that makes it worse. Could be worse while standing or even while lying down. Diffuse headache and photophobia and sonophobia that is now better. Not like her usual migraines.  Objective Vitals:   05/08/17 0656 05/08/17 0852  BP: 105/64 97/65  Pulse: (!) 51 69  Resp: 16 16  Temp:  98.2 F (36.8 C)  SpO2: 100% 100%  Gen.: Well-developed well-nourished HEENT: Normocephalic, atraumatic, no scleral injection, clear nares, clear throat CVS: S1-S2 heard, regular rate rhythm Respiratory: CTA B  GI: Soft nondistended nontender Neurological exam Awake alert oriented 3 Speech is clear. Naming comprehension and repetition are intact Cranial nerve exam: Visual fields full, pupils equal round reactive to light, extraocular movements intact without any diplopia. She has nystagmus on leftward gaze with a fast component to the left. Face is symmetric. Facial sensation intact. Auditory acuity intact. Palate elevates symmetrically. Shoulder shrug symmetric. Tongue midline. Motor: 5/5 strength all over with normal tone and normal range of motion. Sensory exam: Intact to light touch. Coordination: No ataxia on finger nose finger Plantars downgoing bilaterally Did not assess gait at this time for patient's safetyBut reports some instability. Head thrust negative  Imaging-MRI negative for stroke  Assessment and recommendations 51 year old with acute vertigo.  At this time, I think this might be less a complicated migraine or vestibular migraine versus more of  vestibular neuritis (based on history and exam).  Recommendations Meclizine as necessary Medrol Dosepak PT assessment for vestibular rehabilitation. Might need continuation of the vestibular rehabilitation as an outpatient as well. Outpatient neurology  follow-up in 4-6 weeks.  Please call with questions  Amie [place], MD [facility] [phone]  If 7pm to 7am, please call on call as listed on AMION.

## 2017-05-08 NOTE — Progress Notes (Signed)
PROGRESS NOTE    Kerry Ortiz  OVF:[id number] DOB: 02-Jan-1966 DOA: 05/07/2017 PCP: Merrilee Seashore, MD   Chief Complaint  Patient presents with  . Stroke Symptoms    Brief Narrative:  Kerry Ortiz a 51 y.o.female,w migraines, fibromyalgia , back pain, apparently presents with dizziness and left lower ext weakness. Starting this am about 11:30am. Took tizanidine last nite and took nexium this am and went to restroom and got up and felt dizzy. "Vertigo" , + nausea. No syncope. Left leg felt weak. Pt denies headache, tinnitus, hearing loss, ear infection, sinus issues, cp, palp, sob, numbness, tingling. Nothing appeared to make the vertigo/weakness better or worse, and therefore presented to ED for evaluation because of persistence of symptoms. Assessment & Plan   Vertigo/left arm and leg weakness, possibly complicated migraine versus vestibulitis/vestibular neuritis -Patient with history of migraine headaches -CT head negative exam -MRI brain: Normal brain MRI, no acute abnormality -Neurology consulted and appreciated, recommended PT assessment first tubular rehabilitation, Medrol Dosepak, meclizine, outpatient neurology follow-up in 4-6 weeks -Echocardiogram showed EF of 60-65%, no regional wall motion abnormalities -Pending vestibular PT  Hypokalemia -Resolved with repletion, repeat BMP in 1 week  Chronic microcytic Anemia -Hemoglobin noted to be 11.9 and 2015, currently 11.3  DVT Prophylaxis  Lovenox  Code Status: Full  Family Communication: None at bedside  Disposition [place]  Consultants Neurology  Procedures  Echocardiogram Carotid doppler  Antibiotics   Anti-infectives    None      Subjective:   Kerry Ortiz seen and examined today.  Patient feels dizziness and headache are mildly better. Denies chest pain, shortness of breath, abdominal pain, N/V/D/C, new weakness, numbess, tingling.    Objective:   Vitals:   05/08/17 1000  05/08/17 1200 05/08/17 1432 05/08/17 1630  BP: 113/68 (!) 100/59 98/61 135/75  Pulse: 72 61 (!) 52 69  Resp: 16  18 18   Temp: 98.2 F (36.8 C) 98.4 F (36.9 C) 98.9 F (37.2 C) 98.2 F (36.8 C)  TempSrc: Oral Oral Oral Oral  SpO2: 100% 100% 100% 100%  Weight:      Height:       No intake or output data in the 24 hours ending 05/08/17 1725 Filed Weights   05/07/17 2307  Weight: 58.1 kg (128 lb)    Exam  General: Well developed, well nourished, NAD, appears stated age  HEENT: NCAT, PERRLA, EOMI, Anicteic Sclera, mucous membranes moist.   Neck: Supple, no JVD, no masses  Cardiovascular: S1 S2 auscultated, no rubs, murmurs or gallops. Regular rate and rhythm.  Respiratory: Clear to auscultation bilaterally with equal chest rise  Abdomen: Soft, nontender, nondistended, + bowel sounds  Extremities: warm dry without cyanosis clubbing or edema  Neuro: AAOx3, cranial nerves grossly intact. Strength 5/5 in patient's upper and lower extremities bilaterally. Nystagmus of the left with leftward gaze  Skin: Without rashes exudates or nodules  Psych: Normal affect and demeanor with intact judgement and insight   Data Reviewed: I have personally reviewed following labs and imaging studies  CBC:  Recent Labs Lab 05/07/17 1845 05/07/17 1855 05/08/17 0558  WBC 4.0  --  5.0  NEUTROABS 1.6*  --   --   HGB 11.5* 13.9 11.3*  HCT 35.2* 41.0 35.2*  MCV 75.1*  --  75.9*  PLT 306  --  841   Basic Metabolic Panel:  Recent Labs Lab 05/07/17 1845 05/07/17 1855 05/08/17 0558  NA 139 142 136  K 3.3* 3.3* 3.5  CL 109 109 111  CO2 22  --  21*  GLUCOSE 94 92 102*  BUN 12 13 11   CREATININE 0.86 0.80 0.82  CALCIUM 9.2  --  8.5*   GFR: Estimated Creatinine Clearance: 64.2 mL/min (by C-G formula based on SCr of 0.82 mg/dL). Liver Function Tests:  Recent Labs Lab 05/07/17 1845 05/08/17 0558  AST 19 16  ALT 21 19  ALKPHOS 35* 31*  BILITOT 0.7 0.7  PROT 7.3 6.2*  ALBUMIN  4.0 3.4*   No results for input(s): LIPASE, AMYLASE in the last 168 hours. No results for input(s): AMMONIA in the last 168 hours. Coagulation Profile:  Recent Labs Lab 05/07/17 1845  INR 0.92   Cardiac Enzymes:  Recent Labs Lab 05/07/17 2317  CKTOTAL 99  CKMB 1.2   BNP (last 3 results) No results for input(s): PROBNP in the last 8760 hours. HbA1C:  Recent Labs  05/08/17 0558  HGBA1C 5.8*   CBG:  Recent Labs Lab 05/07/17 1850  GLUCAP 85   Lipid Profile:  Recent Labs  05/08/17 0558  CHOL 153  HDL 49  LDLCALC 90  TRIG 68  CHOLHDL 3.1   Thyroid Function Tests:  Recent Labs  05/07/17 2323  TSH 0.891   Anemia Panel: No results for input(s): VITAMINB12, FOLATE, FERRITIN, TIBC, IRON, RETICCTPCT in the last 72 hours. Urine analysis:    Component Value Date/Time   COLORURINE YELLOW 06/30/2015 0025   APPEARANCEUR CLOUDY (A) 06/30/2015 0025   LABSPEC 1.025 06/30/2015 0025   PHURINE 5.5 06/30/2015 0025   GLUCOSEU NEGATIVE 06/30/2015 0025   HGBUR NEGATIVE 06/30/2015 0025   BILIRUBINUR NEGATIVE 06/30/2015 0025   KETONESUR NEGATIVE 06/30/2015 0025   PROTEINUR NEGATIVE 06/30/2015 0025   UROBILINOGEN 1.0 11/16/2013 1957   NITRITE NEGATIVE 06/30/2015 0025   LEUKOCYTESUR NEGATIVE 06/30/2015 0025   Sepsis Labs: @LABRCNTIP (procalcitonin:4,lacticidven:4)  )No results found for this or any previous visit (from the past 240 hour(s)).    Radiology Studies: Dg Chest 2 View  Result Date: 05/08/2017 CLINICAL DATA:  Weakness today. EXAM: CHEST  2 VIEW COMPARISON:  Chest radiograph June 29, 2015 FINDINGS: Cardiomediastinal silhouette is normal. No pleural effusions or focal consolidations. Trachea projects midline and there is no pneumothorax. Soft tissue planes and included osseous structures are non-suspicious. Mild degenerative change of thoracic spine. IMPRESSION: Stable negative chest. Electronically Signed   By: Elon Alas M.D.   On: 05/08/2017 00:49    Ct Head Wo Contrast  Result Date: 05/07/2017 CLINICAL DATA:  Extreme dizziness that began earlier today. EXAM: CT HEAD WITHOUT CONTRAST TECHNIQUE: Contiguous axial images were obtained from the base of the skull through the vertex without intravenous contrast. COMPARISON:  11/25/2016. FINDINGS: Brain: No evidence of acute infarction, hemorrhage, hydrocephalus, extra-axial collection or mass lesion/mass effect. Vascular: No hyperdense vessel or unexpected calcification. Skull: Normal. Negative for fracture or focal lesion. Sinuses/Orbits: No acute finding. Other: None. IMPRESSION: Negative exam.  No change from priors. Electronically Signed   By: Staci Righter M.D.   On: 05/07/2017 19:23   Mr Brain Wo Contrast  Result Date: 05/07/2017 CLINICAL DATA:  Initial evaluation for acute slurred speech, dizziness, tract in left foot. Suspect stroke. EXAM: MRI HEAD WITHOUT CONTRAST TECHNIQUE: Multiplanar, multiecho pulse sequences of the brain and surrounding structures were obtained without intravenous contrast. COMPARISON:  Prior CT from earlier the same day. FINDINGS: Brain: Cerebral volume normal. No significant cerebral white matter disease for age. No abnormal foci of restricted diffusion to suggest acute or subacute ischemia. Gray-white matter differentiation maintained. No  evidence for chronic infarction. No evidence for acute or chronic intracranial hemorrhage. No mass lesion, midline shift or mass effect. No hydrocephalus. No extra-axial fluid collection. Major dural sinuses are grossly patent. Pituitary gland suprasellar region normal. Midline structures intact and normal. Vascular: Major intracranial vascular flow voids are maintained. Skull and upper cervical spine: Craniocervical junction normal. Upper cervical spine within normal limits. Bone marrow signal intensity normal. Scalp soft tissues. Sinuses/Orbits: Globes and orbital soft tissues normal. Paranasal sinuses clear. No mastoid effusion. Inner  ear structures normal. Other: None. IMPRESSION: Normal brain MRI.  No acute intracranial abnormality identified. Electronically Signed   By: Jeannine Boga M.D.   On: 05/07/2017 21:36     Scheduled Meds: . amitriptyline  25 mg Oral QHS  . aspirin  300 mg Rectal Daily   Or  . aspirin  325 mg Oral Daily  . atorvastatin  80 mg Oral q1800  . citalopram  10 mg Oral Daily  . diclofenac  75 mg Oral BID  . enoxaparin (LOVENOX) injection  40 mg Subcutaneous Q24H  . pantoprazole  40 mg Oral Daily  . pregabalin  150 mg Oral BID  . sucralfate  1 g Oral q morning - 10a  . zonisamide  100 mg Oral BID   Continuous Infusions:   LOS: 0 days   Time Spent in minutes   30 minutes  Mya Suell D.O. on 05/08/2017 at 5:25 PM  Between 7am to 7pm - Pager - [phone]  After 7pm go to [email or web address] - password [email or web address]  And look for the night coverage person covering for me after hours  [facility] Office  [phone]

## 2017-05-08 NOTE — Care Management Note (Signed)
Case Management Note  Patient Details  Name: Kerry Ortiz MRN: [id number] Date of Birth: 28-Feb-1966  Subjective/Objective:   Pt in with dizziness and leg weakness. She is from home.                  Action/Plan: Awaiting PT/OT evaluations. CM following for d/c needs, physician orders.   Expected Discharge Date:                  Expected Discharge Plan:     In-House Referral:     Discharge planning Services     Post Acute Care Choice:    Choice offered to:     DME Arranged:    DME Agency:     HH Arranged:    HH Agency:     Status of Service:  In process, will continue to follow  If discussed at Long Length of Stay Meetings, dates discussed:    Additional Comments:  Pollie Friar, RN 05/08/2017, 11:38 AM

## 2017-05-08 NOTE — Consult Note (Signed)
[facility] CM Primary Care Navigator  05/08/2017  Kerry Ortiz [birth year] [id number]    Met with patientat the bedside to identify possible discharge needs.  Patient reports that she was having dizziness, slurring of speech and leg weakness that led to this admission.  Patient endorses Dr. Thressa Sheller with [facility] as her primary care provider.  Patient [place] [facility] obtain medications without any problem. Patient manages her own medications at home straight out of the containers.  Per patient's report, she was driving prior to admission but mother Kerry Ortiz) can be able to provide transportation to her doctors' appointments after discharge if needed.  Patient lives alone at home but states that she usually stays at her mother's house to recover when needed.  Patient is hoping to be discharged home. Awaiting PT/ OT evaluation/ recommendation and physician order for discharge disposition.  Patientexpressed understanding to callprimary care provider's officewhen she returnshome for a post discharge follow-up appointment within a week or sooner if needed. Patient letter (with PCP's contact number) was provided as herreminder.  Explained to patient about [facility] CM services available for health management at home but shedeniesany current needs or concerns at this time. She had opted and verbally agreed forEMMI calls tofollow-up with her recoveryat home.   Referral to [place] calls made after discharge.  Patient voiced understanding to seek referral from primary care provider to [facility] care management if necessary and appropriate for services in the future.  [facility] care management information provided for future needs that she may have.    For questions, please contact:  Dannielle Huh, BSN, RN- [facility] Primary Care Navigator  Telephone: [phone] [place]

## 2017-05-08 NOTE — Discharge Summary (Signed)
Physician Discharge Summary  Kerry Ortiz YQI:[id number] DOB: 09-27-1965 DOA: 05/07/2017  PCP: Merrilee Seashore, MD  Admit date: 05/07/2017 Discharge date: 05/09/2017  Time spent: 45 minutes  Recommendations for Outpatient Follow-up:  Patient will be discharged to home with home health.  Patient will need to follow up with primary care provider within one week of discharge.  Follow up with neurology in 4 weeks. Patient should continue medications as prescribed.  Patient should follow a regular diet.   Discharge Diagnoses:  Vertigo/left arm and leg weakness, possibly complicated migraine versus vestibulitis/vestibular neuritis Hypokalemia Chronic microcytic Anemia  Discharge Condition: Stable  Diet recommendation: Regular  Filed Weights   05/07/17 2307  Weight: 58.1 kg (128 lb)    History of present illness:  On 05/07/2017 by Dr. Jani Gravel Kerry Ortiz  is a 51 y.o. female, w migraines, fibromyalgia , back pain, apparently presents with dizziness and left lower ext weakness.  Starting this am about 11:30am.  Took tizanidine last nite and took nexium this am and went to restroom and got up and felt dizzy. "Vertigo"  , + nausea.  No syncope.  Left leg felt weak.  Pt denies headache, tinnitus, hearing loss, ear infection, sinus issues, cp, palp, sob, numbness, tingling.  Nothing appeared to make the vertigo/weakness better or worse, and therefore presented to ED for evaluation because of persistence of symptoms.   Hospital Course:  Vertigo/left arm and leg weakness, possibly complicated migraine versus vestibulitis/vestibular neuritis -Patient with history of migraine headaches -CT head negative exam -MRI brain: Normal brain MRI, no acute abnormality -Neurology consulted and appreciated, recommended PT assessment first tubular rehabilitation, Medrol Dosepak, meclizine, outpatient neurology follow-up in 4-6 weeks -Echocardiogram showed EF of 60-65%, no regional wall motion  abnormalities -PT/OT recommended HH -patient evaluated by vestibular PT and given exercises  Hypokalemia -Resolved with repletion, repeat BMP in 1 week  Chronic microcytic Anemia -Hemoglobin noted to be 11.9 and 2015, currently 11.3  Procedures: Echocardiogram  Consultations: Neurology   Discharge Exam: Vitals:   05/09/17 0042 05/09/17 0548  BP: 113/60 117/69  Pulse: (!) 55 (!) 56  Resp: 16 16  Temp: 98.6 F (37 C) 98.6 F (37 C)  SpO2: 100% 100%   Patient feels dizziness has improved. Denies chest pain, shortness of breath, abdominal pain, N/V/D/C.    General: Well developed, well nourished, NAD, appears stated age  HEENT: NCAT, mucous membranes moist.  Cardiovascular: S1 S2 auscultated, no rubs, murmurs or gallops. Regular rate and rhythm.  Respiratory: Clear to auscultation bilaterally with equal chest rise  Abdomen: Soft, nontender, nondistended, + bowel sounds  Extremities: warm dry without cyanosis clubbing or edema  Neuro: AAOx3, nonfocal, Nystagmus with leftward visual gaze  Psych: Normal affect and demeanor with intact judgement and insight  Discharge Instructions Discharge Instructions    Discharge instructions    Complete by:  As directed    Patient will be discharged to home.  Patient will need to follow up with primary care provider within one week of discharge.  Follow up with neurology in 4-6 weeks. Patient should continue medications as prescribed.  Patient should follow a regular diet.     Current Discharge Medication List    START taking these medications   Details  meclizine (ANTIVERT) 25 MG tablet Take 1 tablet (25 mg total) by mouth 3 (three) times daily as needed for dizziness. Qty: 30 tablet, Refills: 0    methylPREDNISolone (MEDROL) 4 MG TBPK tablet Use as directed Qty: 21 tablet, Refills: 0  CONTINUE these medications which have NOT CHANGED   Details  albuterol (PROVENTIL HFA;VENTOLIN HFA) 108 (90 BASE) MCG/ACT inhaler  Inhale 2 puffs into the lungs every 6 (six) hours as needed for wheezing or shortness of breath. Qty: 1 Inhaler, Refills: 2    amitriptyline (ELAVIL) 25 MG tablet Take 25 mg by mouth at bedtime.    citalopram (CELEXA) 10 MG tablet Take 1 tablet (10 mg total) by mouth daily. Qty: 30 tablet, Refills: 6    diclofenac (VOLTAREN) 75 MG EC tablet Take 75 mg by mouth 2 (two) times daily.    esomeprazole (NEXIUM) 40 MG capsule Take 40 mg by mouth daily before breakfast.    fluticasone (FLONASE) 50 MCG/ACT nasal spray Place 2 sprays into both nostrils daily. Qty: 16 g, Refills: 2    ibuprofen (ADVIL,MOTRIN) 800 MG tablet Take 1 tablet (800 mg total) by mouth every 8 (eight) hours as needed for mild pain or moderate pain (pain.). Qty: 30 tablet, Refills: 0    ondansetron (ZOFRAN ODT) 4 MG disintegrating tablet Take 1 tablet (4 mg total) by mouth every 8 (eight) hours as needed. Qty: 20 tablet, Refills: 6    pregabalin (LYRICA) 150 MG capsule Take 150 mg by mouth 2 (two) times daily.     rizatriptan (MAXALT-MLT) 10 MG disintegrating tablet Take 1 tablet (10 mg total) by mouth as needed. May repeat in 2 hours if needed Qty: 15 tablet, Refills: 6    Spacer/Aero-Holding Chambers (AEROCHAMBER PLUS FLO-VU LARGE) MISC 1 each by Other route once. Qty: 1 each, Refills: 0    sucralfate (CARAFATE) 1 G tablet Take 1 tablet (1 g total) by mouth 4 (four) times daily. Qty: 20 tablet, Refills: 0    tiZANidine (ZANAFLEX) 4 MG tablet Take 4 mg by mouth every 8 (eight) hours as needed for muscle spasms.  Refills: 2    zonisamide (ZONEGRAN) 100 MG capsule Take 1 capsule (100 mg total) by mouth 2 (two) times daily. Qty: 60 capsule, Refills: 11       No Known Allergies Follow-up Information    Merrilee Seashore, MD. Schedule an appointment as soon as possible for a visit in 1 week(s).   Specialty:  Internal Medicine Why:  Hospital follow up Contact information: [address] [place] [address] [phone]        Marcial Pacas, MD. Schedule an appointment as soon as possible for a visit in 3 week(s).   Specialty:  Neurology Why:  Hospital follow up Contact information: [address] [place] [address] [phone]            The results of significant diagnostics from this hospitalization (including imaging, microbiology, ancillary and laboratory) are listed below for reference.    Significant Diagnostic Studies: Dg Chest 2 View  Result Date: 05/08/2017 CLINICAL DATA:  Weakness today. EXAM: CHEST  2 VIEW COMPARISON:  Chest radiograph June 29, 2015 FINDINGS: Cardiomediastinal silhouette is normal. No pleural effusions or focal consolidations. Trachea projects midline and there is no pneumothorax. Soft tissue planes and included osseous structures are non-suspicious. Mild degenerative change of thoracic spine. IMPRESSION: Stable negative chest. Electronically Signed   By: Elon Alas M.D.   On: 05/08/2017 00:49   Ct Head Wo Contrast  Result Date: 05/07/2017 CLINICAL DATA:  Extreme dizziness that began earlier today. EXAM: CT HEAD WITHOUT CONTRAST TECHNIQUE: Contiguous axial images were obtained from the base of the skull through the vertex without intravenous contrast. COMPARISON:  11/25/2016. FINDINGS:  Brain: No evidence of acute infarction, hemorrhage, hydrocephalus, extra-axial collection or mass lesion/mass effect. Vascular: No hyperdense vessel or unexpected calcification. Skull: Normal. Negative for fracture or focal lesion. Sinuses/Orbits: No acute finding. Other: None. IMPRESSION: Negative exam.  No change from priors. Electronically Signed   By: Staci Righter M.D.   On: 05/07/2017 19:23   Mr Brain Wo Contrast  Result Date: 05/07/2017 CLINICAL DATA:  Initial evaluation for acute slurred speech, dizziness, tract in left foot. Suspect stroke. EXAM: MRI HEAD WITHOUT CONTRAST TECHNIQUE: Multiplanar, multiecho pulse sequences of  the brain and surrounding structures were obtained without intravenous contrast. COMPARISON:  Prior CT from earlier the same day. FINDINGS: Brain: Cerebral volume normal. No significant cerebral white matter disease for age. No abnormal foci of restricted diffusion to suggest acute or subacute ischemia. Gray-white matter differentiation maintained. No evidence for chronic infarction. No evidence for acute or chronic intracranial hemorrhage. No mass lesion, midline shift or mass effect. No hydrocephalus. No extra-axial fluid collection. Major dural sinuses are grossly patent. Pituitary gland suprasellar region normal. Midline structures intact and normal. Vascular: Major intracranial vascular flow voids are maintained. Skull and upper cervical spine: Craniocervical junction normal. Upper cervical spine within normal limits. Bone marrow signal intensity normal. Scalp soft tissues. Sinuses/Orbits: Globes and orbital soft tissues normal. Paranasal sinuses clear. No mastoid effusion. Inner ear structures normal. Other: None. IMPRESSION: Normal brain MRI.  No acute intracranial abnormality identified. Electronically Signed   By: Jeannine Boga M.D.   On: 05/07/2017 21:36    Microbiology: No results found for this or any previous visit (from the past 240 hour(s)).   Labs: Basic Metabolic Panel:  Recent Labs Lab 05/07/17 1845 05/07/17 1855 05/08/17 0558  NA 139 142 136  K 3.3* 3.3* 3.5  CL 109 109 111  CO2 22  --  21*  GLUCOSE 94 92 102*  BUN 12 13 11   CREATININE 0.86 0.80 0.82  CALCIUM 9.2  --  8.5*   Liver Function Tests:  Recent Labs Lab 05/07/17 1845 05/08/17 0558  AST 19 16  ALT 21 19  ALKPHOS 35* 31*  BILITOT 0.7 0.7  PROT 7.3 6.2*  ALBUMIN 4.0 3.4*   No results for input(s): LIPASE, AMYLASE in the last 168 hours. No results for input(s): AMMONIA in the last 168 hours. CBC:  Recent Labs Lab 05/07/17 1845 05/07/17 1855 05/08/17 0558  WBC 4.0  --  5.0  NEUTROABS 1.6*   --   --   HGB 11.5* 13.9 11.3*  HCT 35.2* 41.0 35.2*  MCV 75.1*  --  75.9*  PLT 306  --  274   Cardiac Enzymes:  Recent Labs Lab 05/07/17 2317  CKTOTAL 99  CKMB 1.2   BNP: BNP (last 3 results) No results for input(s): BNP in the last 8760 hours.  ProBNP (last 3 results) No results for input(s): PROBNP in the last 8760 hours.  CBG:  Recent Labs Lab 05/07/17 1850  GLUCAP 85       Signed:  Camila Maita  [facility] 05/09/2017, 10:08 AM

## 2017-05-08 NOTE — Progress Notes (Signed)
  Echocardiogram 2D Echocardiogram has been performed.  Merrie Roof F 05/08/2017, 12:20 PM

## 2017-05-09 DIAGNOSIS — R42 Dizziness and giddiness: Secondary | ICD-10-CM | POA: Diagnosis not present

## 2017-05-09 DIAGNOSIS — R29898 Other symptoms and signs involving the musculoskeletal system: Secondary | ICD-10-CM | POA: Diagnosis not present

## 2017-05-09 DIAGNOSIS — R2990 Unspecified symptoms and signs involving the nervous system: Secondary | ICD-10-CM | POA: Diagnosis not present

## 2017-05-09 DIAGNOSIS — R531 Weakness: Secondary | ICD-10-CM | POA: Diagnosis not present

## 2017-05-09 DIAGNOSIS — D649 Anemia, unspecified: Secondary | ICD-10-CM | POA: Diagnosis not present

## 2017-05-09 DIAGNOSIS — E876 Hypokalemia: Secondary | ICD-10-CM | POA: Diagnosis not present

## 2017-05-09 MED ORDER — MECLIZINE HCL 25 MG PO TABS: 25.0000 mg | ORAL_TABLET | Freq: Three times a day (TID) | ORAL | 0 refills | Status: DC | PRN

## 2017-05-09 MED ORDER — METHYLPREDNISOLONE 4 MG PO TBPK: ORAL_TABLET | ORAL | 0 refills | Status: DC

## 2017-05-09 NOTE — Progress Notes (Signed)
Occupational Therapy Treatment Patient Details Name: Kerry Ortiz MRN: [id number] DOB: 1966/06/18 Today's Date: 05/09/2017    History of present illness Pt is a 51 y.o. female presenting with dizziness an LLE weakness. PMHx: migraines, fibromyalgia, back pain. MRI on 10/4 negative for acute abnormality.   OT comments  Pt. Making gains with skilled OT.  Focus of session today was higher level ADLS.  Pt. Able to obtain items from various levels in closet along with amb. To and from the b.room for toileting tasks.   LOB x1 when turning to flush toilet.  Agree with initial recommendations for [facility] for continued therapy once home for increasing safety with ADLS.    Follow Up Recommendations  Home health OT    Equipment Recommendations  None recommended by OT    Recommendations for Other Services      Precautions / Restrictions Precautions Precautions: Fall       Mobility Bed Mobility Overal bed mobility: Needs Assistance Bed Mobility: Rolling;Sidelying to Sit           General bed mobility comments: Increased time, no physical assist, HOB flat no rails exits on R side of bed at home  Transfers Overall transfer level: Needs assistance Equipment used: None Transfers: Sit to/from Stand;Stand Pivot Transfers Sit to Stand: Min guard Stand pivot transfers: Min guard       General transfer comment: for safety, mild unsteadiness with LOB X 1 with mobility    Balance                                           ADL either performed or assessed with clinical judgement   ADL Overall ADL's : Needs assistance/impaired     Grooming: Wash/dry hands;Wash/dry face;Oral care;Standing;Min guard;Sitting Grooming Details (indicate cue type and reason): reviewed sitting as needed if feeling dizzy or light headed, as part of energy conservation and safety strategies                 Toilet Transfer: Min guard;Ambulation;BSC;Regular Glass blower/designer Details  (indicate cue type and reason): 3n1 over commode to allow for arm rests for safe transfers, LOB x1 to the right when turning to flush the toilet. pt. with multiple side steps to regain balance and provided physical assistance to correct Toileting- Clothing Manipulation and Hygiene: Supervision/safety;Sitting/lateral lean       Functional mobility during ADLs: Min guard General ADL Comments: pt. able to amb. in room and open closet doors to locate, reach and obatain wash cloth then close closet door and perfrom grooming tasks.  LOB x1 during toileting tasks.  reports she can have people with her for additonal safety. will continue to follow      Vision       Perception     Praxis      Cognition Arousal/Alertness: Lethargic Behavior During Therapy: Flat affect                                            Exercises     Shoulder Instructions       General Comments      Pertinent Vitals/ Pain       Pain Assessment: No/denies pain  Home Living  Prior Functioning/Environment              Frequency  Min 2X/week        Progress Toward Goals  OT Goals(current goals can now be found in the care plan section)  Progress towards OT goals: Progressing toward goals     Plan Discharge plan remains appropriate    Co-evaluation                 AM-PAC PT "6 Clicks" Daily Activity     Outcome Measure   Help from another person eating meals?: None Help from another person taking care of personal grooming?: A Little Help from another person toileting, which includes using toliet, bedpan, or urinal?: A Little Help from another person bathing (including washing, rinsing, drying)?: A Little Help from another person to put on and taking off regular upper body clothing?: A Little Help from another person to put on and taking off regular lower body clothing?: A Little 6 Click Score: 19    End  of Session    OT Visit Diagnosis: Unsteadiness on feet (R26.81);Other abnormalities of gait and mobility (R26.89);Dizziness and giddiness (R42)   Activity Tolerance Patient tolerated treatment well   Patient Left in chair;with call bell/phone within reach   Nurse Communication          Time: 0721-0741 OT Time Calculation (min): 20 min  Charges: OT General Charges $OT Visit: 1 Visit OT Treatments $Self Care/Home Management : 8-22 mins   Janice Coffin, COTA/L 05/09/2017, 7:52 AM

## 2017-05-09 NOTE — Progress Notes (Signed)
Physical Therapy Treatment Patient Details Name: Kerry Ortiz MRN: [id number] DOB: 04-Jan-1966 Today's Date: 05/09/2017    History of Present Illness Pt is a 51 y.o. female presenting with dizziness an LLE weakness. PMHx: migraines, fibromyalgia, back pain. MRI on 10/4 negative for acute abnormality.    PT Comments    See below progress note for vestibular assessment. Signs and symptoms consistent with right lateral cupulolithiasis (BPPV.) Educated on exercises (Gufoni and BBQ Roll maneuvers.) Handouts provided for home use. Would benefit from vestibular rehab.  05/09/17 0001  Vestibular Assessment  General Observation Guarded head movements  Symptom Behavior  Type of Dizziness "World moves"  Frequency of Dizziness with head movements only  Duration of Dizziness brief at times, constant with left sidelying  Aggravating Factors Supine to sit;Rolling to right;Rolling to left  Relieving Factors Head stationary  Occulomotor Exam  Occulomotor Alignment Normal  Spontaneous Absent  Gaze-induced Absent  Smooth Pursuits Intact  Saccades Intact  Positional Testing  Dix-Hallpike Dix-Hallpike Left;Dix-Hallpike Right  Sidelying Test Sidelying Right;Sidelying Left  Horizontal Canal Testing Horizontal Canal Right;Horizontal Canal Left;Horizontal Canal Right Intensity;Horizontal Canal Left Intensity  Dix-Hallpike Right  Dix-Hallpike Right Duration negative  Dix-Hallpike Right Symptoms No nystagmus  Dix-Hallpike Left  Dix-Hallpike Left Duration 3 sec  Dix-Hallpike Left Symptoms Right nystagmus  Sidelying Right  Sidelying Right Duration >80minute  Sidelying Right Symptoms Left nystagmus  Sidelying Left  Sidelying Left Duration >2 minutes  Sidelying Left Symptoms Right nystagmus  Horizontal Canal Right  Horizontal Canal Right Duration >2 minutes   Horizontal Canal Right Symptoms Ageotrophic  Horizontal Canal Left  Horizontal Canal Left Duration >1 minute  Horizontal Canal Left Symptoms  Ageotrophic;Nystagmus  Horizontal Canal Right Intensity  Horizontal Canal Right Intensity Severe  Right Intensity Comment left sidelying, severe (indicates Rt canal)  Horizontal Canal Left Intensity  Horizontal Canal Left Intensity Mild  Left Intensity Comment Right sidelying, mild (indicates Rt canal)  Cognition  Cognition Orientation Level Oriented x 4  Cognition Comment Flat affect      Follow Up Recommendations  Home health PT;Other (comment) (vestibular PT)     Equipment Recommendations  None recommended by PT    Recommendations for Other Services       Precautions / Restrictions Precautions Precautions: Fall Restrictions Weight Bearing Restrictions: No    Mobility  Bed Mobility Overal bed mobility: Modified Independent Bed Mobility: Rolling;Sidelying to Sit           General bed mobility comments: Increased time, no physical assist, HOB flat no rails exits on R side of bed at home  Transfers Overall transfer level: Needs assistance Equipment used: None Transfers: Sit to/from Stand Sit to Stand: Min guard Stand pivot transfers: Min guard       General transfer comment: for safety, mild unsteadiness but no LOB with mobility  Ambulation/Gait                 Stairs            Wheelchair Mobility    Modified Rankin (Stroke Patients Only)       Balance                                            Cognition Arousal/Alertness: Awake/alert Behavior During Therapy: Flat affect Overall Cognitive Status: Within Functional Limits for tasks assessed  Exercises Other Exercises Other Exercises: Gufoni and BBQ roll performed x3 - handouts provided and reviewed. Peformed for Rt lateral cupulolithiasis    General Comments General comments (skin integrity, edema, etc.): See vestibular assessement for significant findings.      Pertinent Vitals/Pain Pain Assessment:  No/denies pain    Home Living                      Prior Function            PT Goals (current goals can now be found in the care plan section) Acute Rehab PT Goals Patient Stated Goal: get better PT Goal Formulation: With patient Time For Goal Achievement: 05/15/17 Potential to Achieve Goals: Good Progress towards PT goals: Progressing toward goals    Frequency    Min 3X/week      PT Plan Current plan remains appropriate    Co-evaluation              AM-PAC PT "6 Clicks" Daily Activity  Outcome Measure  Difficulty turning over in bed (including adjusting bedclothes, sheets and blankets)?: None Difficulty moving from lying on back to sitting on the side of the bed? : None Difficulty sitting down on and standing up from a chair with arms (e.g., wheelchair, bedside commode, etc,.)?: A Little Help needed moving to and from a bed to chair (including a wheelchair)?: A Little Help needed walking in hospital room?: A Little Help needed climbing 3-5 steps with a railing? : A Little 6 Click Score: 20    End of Session   Activity Tolerance: Patient tolerated treatment well Patient left: in bed;with call bell/phone within reach Nurse Communication: Mobility status PT Visit Diagnosis: BPPV;Other (comment) (vertigo - right horizontal BPPV) BPPV - Right/Left : Left;Right     Time: 2751-7001 PT Time Calculation (min) (ACUTE ONLY): 41 min  Charges:  $Neuromuscular Re-education: 23-37 mins $Canalith Rep Proc: 8-22 mins                    G Codes:  Functional Assessment Tool Used: AM-PAC 6 Clicks Basic Mobility;Clinical judgement Functional Limitation: Mobility: Walking and moving around Mobility: Walking and Moving Around Current Status ([id number]): At least 1 percent but less than 20 percent impaired, limited or restricted Mobility: Walking and Moving Around Goal Status [phone]): 0 percent impaired, limited or restricted    [facility],  [place]   Ellouise Newer 05/09/2017, 10:07 AM

## 2017-05-09 NOTE — Discharge Instructions (Signed)
Migraine Headache A migraine headache is an intense, throbbing pain on one side or both sides of the head. Migraines may also cause other symptoms, such as nausea, vomiting, and sensitivity to light and noise. What are the causes? Doing or taking certain things may also trigger migraines, such as:  Alcohol.  Smoking.  Medicines, such as: ? Medicine used to treat chest pain (nitroglycerine). ? Birth control pills. ? Estrogen pills. ? Certain blood pressure medicines.  Aged cheeses, chocolate, or caffeine.  Foods or drinks that contain nitrates, glutamate, aspartame, or tyramine.  Physical activity.  Other things that may trigger a migraine include:  Menstruation.  Pregnancy.  Hunger.  Stress, lack of sleep, too much sleep, or fatigue.  Weather changes.  What increases the risk? The following factors may make you more likely to experience migraine headaches:  Age. Risk increases with age.  Family history of migraine headaches.  Being Caucasian.  Depression and anxiety.  Obesity.  Being a woman.  Having a hole in the heart (patent foramen ovale) or other heart problems.  What are the signs or symptoms? The main symptom of this condition is pulsating or throbbing pain. Pain may:  Happen in any area of the head, such as on one side or both sides.  Interfere with daily activities.  Get worse with physical activity.  Get worse with exposure to bright lights or loud noises.  Other symptoms may include:  Nausea.  Vomiting.  Dizziness.  General sensitivity to bright lights, loud noises, or smells.  Before you get a migraine, you may get warning signs that a migraine is developing (aura). An aura may include:  Seeing flashing lights or having blind spots.  Seeing bright spots, halos, or zigzag lines.  Having tunnel vision or blurred vision.  Having numbness or a tingling feeling.  Having trouble talking.  Having muscle weakness.  How is this  diagnosed? A migraine headache can be diagnosed based on:  Your symptoms.  A physical exam.  Tests, such as CT scan or MRI of the head. These imaging tests can help rule out other causes of headaches.  Taking fluid from the spine (lumbar puncture) and analyzing it (cerebrospinal fluid analysis, or CSF analysis).  How is this treated? A migraine headache is usually treated with medicines that:  Relieve pain.  Relieve nausea.  Prevent migraines from coming back.  Treatment may also include:  Acupuncture.  Lifestyle changes like avoiding foods that trigger migraines.  Follow these instructions at home: Medicines  Take over-the-counter and prescription medicines only as told by your health care provider.  Do not drive or use heavy machinery while taking prescription pain medicine.  To prevent or treat constipation while you are taking prescription pain medicine, your health care provider may recommend that you: ? Drink enough fluid to keep your urine clear or pale yellow. ? Take over-the-counter or prescription medicines. ? Eat foods that are high in fiber, such as fresh fruits and vegetables, whole grains, and beans. ? Limit foods that are high in fat and processed sugars, such as fried and sweet foods. Lifestyle  Avoid alcohol use.  Do not use any products that contain nicotine or tobacco, such as cigarettes and e-cigarettes. If you need help quitting, ask your health care provider.  Get at least 8 hours of sleep every night.  Limit your stress. General instructions   Keep a journal to find out what may trigger your migraine headaches. For example, write down: ? What you eat and  drink. ? How much sleep you get. ? Any change to your diet or medicines.  If you have a migraine: ? Avoid things that make your symptoms worse, such as bright lights. ? It may help to lie down in a dark, quiet room. ? Do not drive or use heavy machinery. ? Ask your health care provider  what activities are safe for you while you are experiencing symptoms.  Keep all follow-up visits as told by your health care provider. This is important. Contact a health care provider if:  You develop symptoms that are different or more severe than your usual migraine symptoms. Get help right away if:  Your migraine becomes severe.  You have a fever.  You have a stiff neck.  You have vision loss.  Your muscles feel weak or like you cannot control them.  You start to lose your balance often.  You develop trouble walking.  You faint. This information is not intended to replace advice given to you by your health care provider. Make sure you discuss any questions you have with your health care provider. Document Released: 07/21/2005 Document Revised: 02/08/2016 Document Reviewed: 01/07/2016 Elsevier Interactive Patient Education  2017 [employer] Dizziness Dizziness is a common problem. It is a feeling of unsteadiness or light-headedness. You may feel like you are about to faint. Dizziness can lead to injury if you stumble or fall. Anyone can become dizzy, but dizziness is more common in older adults. This condition can be caused by a number of things, including medicines, dehydration, or illness. Follow these instructions at home: Taking these steps may help with your condition: Eating and drinking  Drink enough fluid to keep your urine clear or pale yellow. This helps to keep you from becoming dehydrated. Try to drink more clear fluids, such as water.  Do not drink alcohol.  Limit your caffeine intake if directed by your health care provider.  Limit your salt intake if directed by your health care provider. Activity  Avoid making quick movements. ? Rise slowly from chairs and steady yourself until you feel okay. ? In the morning, first sit up on the side of the bed. When you feel okay, stand slowly while you hold onto something until you know that your balance is  fine.  Move your legs often if you need to stand in one place for a long time. Tighten and relax your muscles in your legs while you are standing.  Do not drive or operate heavy machinery if you feel dizzy.  Avoid bending down if you feel dizzy. Place items in your home so that they are easy for you to reach without leaning over. Lifestyle  Do not use any tobacco products, including cigarettes, chewing tobacco, or electronic cigarettes. If you need help quitting, ask your health care provider.  Try to reduce your stress level, such as with yoga or meditation. Talk with your health care provider if you need help. General instructions  Watch your dizziness for any changes.  Take medicines only as directed by your health care provider. Talk with your health care provider if you think that your dizziness is caused by a medicine that you are taking.  Tell a friend or a family member that you are feeling dizzy. If he or she notices any changes in your behavior, have this person call your health care provider.  Keep all follow-up visits as directed by your health care provider. This is important. Contact a health care provider if:  Your  dizziness does not go away.  Your dizziness or light-headedness gets worse.  You feel nauseous.  You have reduced hearing.  You have new symptoms.  You are unsteady on your feet or you feel like the room is spinning. Get help right away if:  You vomit or have diarrhea and are unable to eat or drink anything.  You have problems talking, walking, swallowing, or using your arms, hands, or legs.  You feel generally weak.  You are not thinking clearly or you have trouble forming sentences. It may take a friend or family member to notice this.  You have chest pain, abdominal pain, shortness of breath, or sweating.  Your vision changes.  You notice any bleeding.  You have a headache.  You have neck pain or a stiff neck.  You have a fever. This  information is not intended to replace advice given to you by your health care provider. Make sure you discuss any questions you have with your health care provider. Document Released: 01/14/2001 Document Revised: 12/27/2015 Document Reviewed: 07/17/2014 Elsevier Interactive Patient Education  2017 [employer].

## 2017-05-09 NOTE — Care Management Note (Signed)
Case Management Note  Patient Details  Name: Kerry Ortiz MRN: [id number] Date of Birth: 1965/11/04  Subjective/Objective:  Pt has chosen [facility] to provide HHPT and HHOT and I have alerted Jermaine, rep for that agency to confirm care. No other needs anticipated.                  Action/Plan:CM will sign off for now but will be available should additional discharge needs arise or disposition change.    Expected Discharge Date:  05/08/17               Expected Discharge Plan:  [place]  In-House Referral:  NA  Discharge planning Services  CM Consult  Post Acute Care Choice:  Home Health Choice offered to:     DME Arranged:    DME Agency:     HH Arranged:  PT, OT HH Agency:  [place]  Status of Service:  Completed, signed off  If discussed at [place] of Stay Meetings, dates discussed:    Additional Comments:  Delrae Sawyers, RN 05/09/2017, 10:46 AM

## 2017-05-09 NOTE — Progress Notes (Signed)
  Clinical Assessment: Signs and symptoms consistent with right lateral cupulolithiasis (BPPV.) Educated on exercises (Gufoni and BBQ Roll maneuvers.) Handouts provided for home use. Would benefit from vestibular PT.   05/09/17 0001  Vestibular Assessment  General Observation Guarded head movements  Symptom Behavior  Type of Dizziness "World moves"  Frequency of Dizziness with head movements only  Duration of Dizziness brief at times, constant with left sidelying  Aggravating Factors Supine to sit;Rolling to right;Rolling to left  Relieving Factors Head stationary  Occulomotor Exam  Occulomotor Alignment Normal  Spontaneous Absent  Gaze-induced Absent  Smooth Pursuits Intact  Saccades Intact  Positional Testing  Dix-Hallpike Dix-Hallpike Left;Dix-Hallpike Right  Sidelying Test Sidelying Right;Sidelying Left  Horizontal Canal Testing Horizontal Canal Right;Horizontal Canal Left;Horizontal Canal Right Intensity;Horizontal Canal Left Intensity  Dix-Hallpike Right  Dix-Hallpike Right Duration negative  Dix-Hallpike Right Symptoms No nystagmus  Dix-Hallpike Left  Dix-Hallpike Left Duration 3 sec  Dix-Hallpike Left Symptoms Right nystagmus  Sidelying Right  Sidelying Right Duration >53minute  Sidelying Right Symptoms Left nystagmus  Sidelying Left  Sidelying Left Duration >2 minutes  Sidelying Left Symptoms Right nystagmus  Horizontal Canal Right  Horizontal Canal Right Duration >2 minutes   Horizontal Canal Right Symptoms Ageotrophic  Horizontal Canal Left  Horizontal Canal Left Duration >1 minute  Horizontal Canal Left Symptoms Ageotrophic;Nystagmus  Horizontal Canal Right Intensity  Horizontal Canal Right Intensity Severe  Right Intensity Comment left sidelying, severe (indicates Rt canal)  Horizontal Canal Left Intensity  Horizontal Canal Left Intensity Mild  Left Intensity Comment Right sidelying, mild (indicates Rt canal)  Cognition  Cognition Orientation Level Oriented  x 4  Cognition Comment Flat affect    Camille Bal [place], [place]

## 2017-05-09 NOTE — Progress Notes (Signed)
Patient given discharge instructions.  All questions and concerns addressed.  Patient left unit by wheelchair accompanied by staff. All belongings with patient.   

## 2017-05-12 LAB — ANTINUCLEAR ANTIBODIES, IFA: ANTINUCLEAR ANTIBODIES, IFA: NEGATIVE

## 2017-05-18 ENCOUNTER — Other Ambulatory Visit: Payer: Self-pay | Admitting: *Deleted

## 2017-05-18 NOTE — Patient Outreach (Signed)
[place] [facility]) Care Management  05/18/2017  Kerry Ortiz 03/31/1966 [id number]  EMMI-General Discharge RED ON EMMI ALERT DAY#: 4 DATE: 05/15/17 RED ALERT: Lost interest in doing things? Yes  Outreach attempt #1 to patient. No answer. RN CM left HIPAA compliant message along with contact info.     Plan: RN CM will contact patient within one week.   [name], RN, BSN, MHA/MSL, [place] Telephonic Care Manager Coordinator [facility] Direct Phone: [phone] Toll Free: [phone] Fax: [phone]

## 2017-05-19 ENCOUNTER — Ambulatory Visit: Payer: Self-pay | Admitting: *Deleted

## 2017-05-19 ENCOUNTER — Other Ambulatory Visit: Payer: Self-pay | Admitting: *Deleted

## 2017-05-19 NOTE — Patient Outreach (Signed)
[place] [facility]) Care Management  05/19/2017  Kerry Ortiz 1966-08-02 [id number]  EMMI-General Discharge RED ON EMMI ALERT DAY#: 4 DATE: 05/15/17 RED ALERT: Lost interest in doing things? Yes  Outreach attempt #2 to patient. No answer. RN CM left HIPAA compliant message along with contact info.    Plan: RN CM will contact patient within one week.   [name], RN, BSN, MHA/MSL, [place] Telephonic Care Manager Coordinator [facility] Direct Phone: [phone] Toll Free: [phone] Fax: [phone]

## 2017-05-19 NOTE — Patient Outreach (Signed)
[place] [facility]) Care Management  05/19/2017  Kerry Ortiz 03/01/1966 [id number]  EMMI- General Discharge RED ON EMMI ALERT DAY#:4 DATE: 05/15/17 RED ALERT:  Lost interest in doing things? Yes  Outreach attempt to patient. HIPAA verified with patient. Patient reported her condition hasn't improved since discharging from the hospital. Patient continues to feel weak, lack energy, and needs assistance with care. Patient reported, she is usually independent, however her condition requires assistance from her mother.Patient discussed how she doesn't like to be bothered and surround herself by others when she is ill.  Patient stated, she hasn't been contacted by a home health agency. Per EMR, [facility] was arranged to provide home health PT/OT. Patient doesn't monitor her blood pressure at home. Patient stated, new medications were added during her most recent hospital stay. She is unsure if any of medications may be causing her side effects.  Patient reported she read and understood her discharge hospital instructions. She continues to take her medications as prescribed. She has a scheduled appointment with her primary MD today (05/19/17).  Plan: RN CM will send referral to [facility] RN for further in home eval/assessment of care needs and management of chronic conditions. RN CM advised patient to contact RNCM for any needs or concerns.  [name], RN, BSN, MHA/MSL, [place] Telephonic Care Manager Coordinator [facility] Direct Phone: [phone] Toll Free: [phone] Fax: [phone]

## 2017-05-20 ENCOUNTER — Ambulatory Visit: Payer: Self-pay | Admitting: *Deleted

## 2017-05-21 ENCOUNTER — Other Ambulatory Visit: Payer: Self-pay | Admitting: *Deleted

## 2017-05-21 DIAGNOSIS — Z09 Encounter for follow-up examination after completed treatment for conditions other than malignant neoplasm: Secondary | ICD-10-CM | POA: Diagnosis not present

## 2017-05-21 DIAGNOSIS — K219 Gastro-esophageal reflux disease without esophagitis: Secondary | ICD-10-CM | POA: Diagnosis not present

## 2017-05-21 DIAGNOSIS — G43909 Migraine, unspecified, not intractable, without status migrainosus: Secondary | ICD-10-CM | POA: Diagnosis not present

## 2017-05-21 DIAGNOSIS — M797 Fibromyalgia: Secondary | ICD-10-CM | POA: Diagnosis not present

## 2017-05-21 NOTE — Patient Outreach (Signed)
[facility]) Care Management  05/21/2017  Kerry Ortiz [birth year] [id number]   Referral received from telephonic care manager, request for further in home evaluation/assessment of care needs with medical management.  She was recently admitted to hospital for observation with stroke-like symptoms (leg weakness) and vertigo.  Per chart, she also has history of chronic migraines, fibromyalgia and depression.  Call placed to member for telephone assessment, no answer.  HIPAA compliant voice message left.  Will await call back.  Will have covering care manager make second attempt to contact tomorrow.  Valente David, [place], MSN [place] [phone]

## 2017-05-22 ENCOUNTER — Other Ambulatory Visit: Payer: Self-pay

## 2017-05-22 NOTE — Patient Outreach (Signed)
[place] [facility]) Care Management  05/22/2017  Kerry Ortiz September 15, 1965 [id number]  Subjective: "I'm feeling a little better".  Objective: none  Assessment: 51 year old with recent observation admission 10/4-10/6. Client reports she had follow up with primary care office on yesterday-no medication changes. She reports she has not heard from home health agency yet. Per chart: [facility]/OT.   RNCM discussed upcoming appointments. She reports she has not made her appointment with neurologist, but plans to call today to schedule that appointment.  Kerry Ortiz states she has all her medications ordered.  Plan: RNCM will follow up with Home Health agency and/or primary care if needed. update assigned RNCM.   Covering RNCM: Thea Silversmith, RN, MSN, [place] Coordinator Cell: [phone]

## 2017-05-22 NOTE — Patient Outreach (Signed)
[place] [facility]) Care Management  05/22/2017  KRISTIANA JACKO 1965/11/09 [id number]   Care Coordination: RNCM called to inform client that [facility] spoke with [facility] and they should be calling her to discuss start of care. No answer. HIPPA compliant message left.  Plan: continue to follow.  Thea Silversmith, RN, MSN, [place] Coordinator Cell: [phone]

## 2017-05-22 NOTE — Patient Outreach (Signed)
[place] [facility]) Care Management  05/22/2017  Kerry Ortiz April 03, 1966 [id number]   Care Coordination: RNCM called [facility]- Spoke with [place] who confirmed they do have the home health orders for client. She states that someone will be calling client today. RNCM verified client's contact number for her.  Plan: follow up with client and update assigned RNCM.  Thea Silversmith, RN, MSN, [place] Coordinator Cell: [phone]

## 2017-05-25 ENCOUNTER — Telehealth: Payer: Self-pay | Admitting: Neurology

## 2017-05-25 NOTE — Telephone Encounter (Signed)
Spoke to patient - she would like an earlier appt.  Her 07/16/17 appt w/ Hoyle Sauer has been moved to an available slot on her schedule on 05/28/17 at 11:15am.  The patient is aware to arrive at 10:45am.

## 2017-05-25 NOTE — Telephone Encounter (Signed)
Patient was discharged from [facility] on 05-09-17 and needs a follow up appointment with Dr. Krista Blue for a follow up for headaches. She is scheduled with Rhina Brackett 07-16-17 but wants to see Dr. Krista Blue sooner per hospital.

## 2017-05-26 ENCOUNTER — Other Ambulatory Visit: Payer: Self-pay | Admitting: *Deleted

## 2017-05-26 NOTE — Patient Outreach (Signed)
[place] [facility]) Care Management  05/26/2017  Kerry Ortiz 1966/06/05 [id number]   Call placed to member to follow up on arrival of home health services and to scheduled home visit.  No answer, HIPAA compliant voice message left.  Will await call back.  If no call back, will follow up within the next week.  Valente David, [place], MSN [place] [phone]

## 2017-05-27 NOTE — Progress Notes (Deleted)
[facility]  PATIENT: Kerry Ortiz DOB: 1965-09-06   REASON FOR VISIT: *** HISTORY FROM:    HISTORY OF PRESENT ILLNESS:On 05/07/2017 by Dr. Jani Ortiz [name] a 51 y.o.female,w migraines, fibromyalgia , back pain, apparently presents with dizziness and left lower ext weakness. Starting this am about 11:30am. Took tizanidine last nite and took nexium this am and went to restroom and got up and felt dizzy. "Vertigo" , + nausea. No syncope. Left leg felt weak. Pt denies headache, tinnitus, hearing loss, ear infection, sinus issues, cp, palp, sob, numbness, tingling. Nothing appeared to make the vertigo/weakness better or worse, and therefore presented to ED for evaluation because of persistence of symptoms.   Hospital Course:  Vertigo/left arm and leg weakness, possibly complicated migraine versus vestibulitis/vestibular neuritis -Patient with history of migraine headaches -CT head negative exam -MRI brain: Normal brain MRI, no acute abnormality -Neurology consulted and appreciated, recommended PT assessment first tubular rehabilitation, Medrol Dosepak, meclizine, outpatient neurology follow-up in 4-6 weeks -Echocardiogram showed EF of 60-65%, no regional wall motion abnormalities -PT/OT recommended HH -patient evaluated by vestibular PT and given exercises   04/23/17 Kerry Ortiz is a 51 year old female, seen in refer by her primary care doctor Kerry Ortiz for evaluation of chronic migraine, initial evaluation was on April 23 2017.  I reviewed and summarized the referring note, she had past medical history of acid reflux, fibromyalgia, prediabetes, hyperlipidemia, chronic migraine headaches,  She reported history of headaches since age 19, her typical migraine starting from occipital region spreading forward pounding severe headache, associated light noise sensitivity, lasting for a few hours, she has tried over-the-counter Tylenol  ibuprofen with limited help, she is now getting more frequent headaches since July 2018 about 2-3 times each week,  She has been evaluated by [facility] in the past, still taking nortriptyline 25 mg 2 tablets every night, zonisamide 25 mg 3 tablets night as preventative medications,  I saw her previously for diffuse body achy pain, paresthesias years throughout her body in 2017,   she had a history of fibromyalgia, carpal tunnel release surgery in the past, chronic migraines, she went on disability since 2008, complains of diffuse body achy pain, on polypharmacy treatment, including Elavil, Lyrica, Zonegran, She continue complains of depression symptoms, tearful during today's interview  Since 2012, she began to notice bilateral feet cold burning sensation, involving bottom and top of her feet, gradually trending up to knee level, getting worse with pressure, barium weight, she has chronic low back and neck pain, denies radiating pain, gait difficulty due to pain, no bowel and bladder incontinence, no bilateral upper extremity paresthesia or weakness,  She complains of feeling tired all the time, lack of interest, I have reviewed laboratory in July 2016, A1c was elevated 5.9, normal B12 567, TSH 1.4, CMP, she had more laboratory evaluation in August 2016  She is on polypharmacy treatment, taking Lyrica 150 mg twice a day, Celexa 10 mg daily, she still has intermittent bilateral feet paresthesia, no significant pain  EMG nerve conduction study in December 2016, there is no evidence of large fiber peripheral neuropathy  I also reviewed laboratory evaluation in November 2016:normal CBC, mildly low potassium on BMP 3.4   REVIEW OF SYSTEMS: Full 14 system review of systems performed and notable only for those listed, all others are neg:  Constitutional: neg  Cardiovascular: neg Ear/Nose/Throat: neg  Skin: neg Eyes: neg Respiratory: neg Gastroitestinal: neg    Hematology/Lymphatic: neg  Endocrine: neg Musculoskeletal:neg Allergy/Immunology:  neg Neurological: neg Psychiatric: neg Sleep : neg   ALLERGIES: No Known Allergies  HOME MEDICATIONS: Outpatient Medications Prior to Visit  Medication Sig Dispense Refill  . albuterol (PROVENTIL HFA;VENTOLIN HFA) 108 (90 BASE) MCG/ACT inhaler Inhale 2 puffs into the lungs every 6 (six) hours as needed for wheezing or shortness of breath. 1 Inhaler 2  . amitriptyline (ELAVIL) 25 MG tablet Take 25 mg by mouth at bedtime.    . citalopram (CELEXA) 10 MG tablet Take 1 tablet (10 mg total) by mouth daily. 30 tablet 6  . diclofenac (VOLTAREN) 75 MG EC tablet Take 75 mg by mouth 2 (two) times daily.    Marland Kitchen esomeprazole (NEXIUM) 40 MG capsule Take 40 mg by mouth daily before breakfast.    . fluticasone (FLONASE) 50 MCG/ACT nasal spray Place 2 sprays into both nostrils daily. (Patient taking differently: Place 2 sprays into both nostrils daily as needed for allergies or rhinitis. ) 16 g 2  . ibuprofen (ADVIL,MOTRIN) 800 MG tablet Take 1 tablet (800 mg total) by mouth every 8 (eight) hours as needed for mild pain or moderate pain (pain.). (Patient taking differently: Take 800 mg by mouth every 8 (eight) hours as needed (for pain). ) 30 tablet 0  . meclizine (ANTIVERT) 25 MG tablet Take 1 tablet (25 mg total) by mouth 3 (three) times daily as needed for dizziness. 30 tablet 0  . methylPREDNISolone (MEDROL) 4 MG TBPK tablet Use as directed 21 tablet 0  . ondansetron (ZOFRAN ODT) 4 MG disintegrating tablet Take 1 tablet (4 mg total) by mouth every 8 (eight) hours as needed. (Patient taking differently: Take 4 mg by mouth every 8 (eight) hours as needed for nausea or vomiting. ) 20 tablet 6  . pregabalin (LYRICA) 150 MG capsule Take 150 mg by mouth 2 (two) times daily.     . rizatriptan (MAXALT-MLT) 10 MG disintegrating tablet Take 1 tablet (10 mg total) by mouth as needed. May repeat in 2 hours if needed (Patient taking  differently: Take 10 mg by mouth once as needed for migraine. May repeat in 2 hours if no relief) 15 tablet 6  . Spacer/Aero-Holding Chambers (AEROCHAMBER PLUS FLO-VU LARGE) MISC 1 each by Other route once. 1 each 0  . sucralfate (CARAFATE) 1 G tablet Take 1 tablet (1 g total) by mouth 4 (four) times daily. (Patient taking differently: Take 1 g by mouth every morning. ) 20 tablet 0  . tiZANidine (ZANAFLEX) 4 MG tablet Take 4 mg by mouth every 8 (eight) hours as needed for muscle spasms.   2  . zonisamide (ZONEGRAN) 100 MG capsule Take 1 capsule (100 mg total) by mouth 2 (two) times daily. 60 capsule 11   No facility-administered medications prior to visit.     PAST MEDICAL HISTORY: Past Medical History:  Diagnosis Date  . Acid reflux   . Asthma   . Disturbance of skin sensation   . Fibromyalgia   . Migraine   . Uterine fibroid     PAST SURGICAL HISTORY: Past Surgical History:  Procedure Laterality Date  . CARPAL TUNNEL RELEASE    . CYSTECTOMY    . HAND SURGERY Right   . SHOULDER SURGERY Right     FAMILY HISTORY: Family History  Problem Relation Age of Onset  . Hypertension Mother   . Arrhythmia Mother   . Thyroid disease Mother   . Pneumonia Father   . Diabetes Maternal Grandmother   . Kidney disease Sister   . Multiple  sclerosis Cousin     SOCIAL HISTORY: Social History   Social History  . Marital status: Single    Spouse name: N/A  . Number of children: 1  . Years of education: HS   Occupational History  . Disabled    Social History Main Topics  . Smoking status: Never Smoker  . Smokeless tobacco: Never Used  . Alcohol use No  . Drug use: No  . Sexual activity: Not Currently    Birth control/ protection: None   Other Topics Concern  . Not on file   Social History Narrative   Lives at home alone.   Right-handed.   No caffeine use.     PHYSICAL EXAM  There were no vitals filed for this visit. There is no height or weight on file to calculate  BMI.  Generalized: Well developed, in no acute distress  Head: normocephalic and atraumatic,. Oropharynx benign  Neck: Supple, no carotid bruits  Cardiac: Regular rate rhythm, no murmur  Musculoskeletal: No deformity   Neurological examination   Mentation: Alert oriented to time, place, history taking. Attention span and concentration appropriate. Recent and remote memory intact.  Follows all commands speech and language fluent.   Cranial nerve II-XII: Fundoscopic exam reveals sharp disc margins.Pupils were equal round reactive to light extraocular movements were full, visual field were full on confrontational test. Facial sensation and strength were normal. hearing was intact to finger rubbing bilaterally. Uvula tongue midline. head turning and shoulder shrug were normal and symmetric.Tongue protrusion into cheek strength was normal. Motor: normal bulk and tone, full strength in the BUE, BLE, fine finger movements normal, no pronator drift. No focal weakness Sensory: normal and symmetric to light touch, pinprick, and  Vibration, proprioception  Coordination: finger-nose-finger, heel-to-shin bilaterally, no dysmetria Reflexes: Brachioradialis 2/2, biceps 2/2, triceps 2/2, patellar 2/2, Achilles 2/2, plantar responses were flexor bilaterally. Gait and Station: Rising up from seated position without assistance, normal stance,  moderate stride, good arm swing, smooth turning, able to perform tiptoe, and heel walking without difficulty. Tandem gait is steady  DIAGNOSTIC DATA (LABS, IMAGING, TESTING) - I reviewed patient records, labs, notes, testing and imaging myself where available.  Lab Results  Component Value Date   WBC 5.0 05/08/2017   HGB 11.3 (L) 05/08/2017   HCT 35.2 (L) 05/08/2017   MCV 75.9 (L) 05/08/2017   PLT 274 05/08/2017      Component Value Date/Time   NA 136 05/08/2017 0558   K 3.5 05/08/2017 0558   CL 111 05/08/2017 0558   CO2 21 (L) 05/08/2017 0558   GLUCOSE 102  (H) 05/08/2017 0558   BUN 11 05/08/2017 0558   CREATININE 0.82 05/08/2017 0558   CALCIUM 8.5 (L) 05/08/2017 0558   PROT 6.2 (L) 05/08/2017 0558   ALBUMIN 3.4 (L) 05/08/2017 0558   AST 16 05/08/2017 0558   ALT 19 05/08/2017 0558   ALKPHOS 31 (L) 05/08/2017 0558   BILITOT 0.7 05/08/2017 0558   GFRNONAA >60 05/08/2017 0558   GFRAA >60 05/08/2017 0558   Lab Results  Component Value Date   CHOL 153 05/08/2017   HDL 49 05/08/2017   LDLCALC 90 05/08/2017   TRIG 68 05/08/2017   CHOLHDL 3.1 05/08/2017   Lab Results  Component Value Date   HGBA1C 5.8 (H) 05/08/2017   No results found for: [id number] Lab Results  Component Value Date   TSH 0.891 05/07/2017    ***  ASSESSMENT AND PLAN  51 y.o. year old female  has  a past medical history of Acid reflux; Asthma; Disturbance of skin sensation; Fibromyalgia; Migraine; and Uterine fibroid. here with ***  Kerry Ortiz is a 51 y.o. female   Chronic migraine headache             Increased preventive medication zonisamide to 100 mg twice a day             Maxalt as needed, plus Zofran as needed Diffuse body achy pain             No treatable etiology found  Dennie Bible, [facility], [facility], [facility] [address] [place], [place] [address] [phone]

## 2017-05-28 ENCOUNTER — Other Ambulatory Visit: Payer: Self-pay | Admitting: *Deleted

## 2017-05-28 ENCOUNTER — Ambulatory Visit: Payer: Self-pay | Admitting: Nurse Practitioner

## 2017-05-28 NOTE — Patient Outreach (Signed)
Kerry Ortiz) Care Management  05/28/2017  Kerry Ortiz 12/02/1965 [id number]   Call placed to member to follow up on arrival of home health services and to scheduled home visit.  No answer, HIPAA compliant voice message left.  Will await call back.  If no call back, will follow up within the next week.  Valente David, [place], MSN [place] [phone]

## 2017-06-02 ENCOUNTER — Other Ambulatory Visit: Payer: Self-pay | Admitting: *Deleted

## 2017-06-02 NOTE — Patient Outreach (Signed)
[place] [facility]) Care Management  06/02/2017  JULITA OZBUN 1965/10/26 [id number]   Call placed to member to follow up on current status and contact from home health agency.  She report she still is not back to herself, state she continues to have headaches and "jumping eyes."  State she has been compliant with medications, report contact with [place].  First home visit will be tomorrow for physical therapy to work on gait/mobility.  She has appointment scheduled with neurology December, report being on a cancellation list to move appointment up if possible.  Has follow up appointment with primary MD within the next couple weeks.  Since she has appointment with home health tomorrow, denies urgent concerns at this time.  Does agree to home visit next week.    Valente David, [place], MSN [place] [phone]

## 2017-06-03 DIAGNOSIS — K219 Gastro-esophageal reflux disease without esophagitis: Secondary | ICD-10-CM | POA: Diagnosis not present

## 2017-06-03 DIAGNOSIS — J45909 Unspecified asthma, uncomplicated: Secondary | ICD-10-CM | POA: Diagnosis not present

## 2017-06-03 DIAGNOSIS — D649 Anemia, unspecified: Secondary | ICD-10-CM | POA: Diagnosis not present

## 2017-06-03 DIAGNOSIS — R29898 Other symptoms and signs involving the musculoskeletal system: Secondary | ICD-10-CM | POA: Diagnosis not present

## 2017-06-03 DIAGNOSIS — H8109 Meniere's disease, unspecified ear: Secondary | ICD-10-CM | POA: Diagnosis not present

## 2017-06-03 DIAGNOSIS — M797 Fibromyalgia: Secondary | ICD-10-CM | POA: Diagnosis not present

## 2017-06-03 DIAGNOSIS — Z7951 Long term (current) use of inhaled steroids: Secondary | ICD-10-CM | POA: Diagnosis not present

## 2017-06-03 DIAGNOSIS — M549 Dorsalgia, unspecified: Secondary | ICD-10-CM | POA: Diagnosis not present

## 2017-06-03 DIAGNOSIS — G43909 Migraine, unspecified, not intractable, without status migrainosus: Secondary | ICD-10-CM | POA: Diagnosis not present

## 2017-06-03 DIAGNOSIS — E876 Hypokalemia: Secondary | ICD-10-CM | POA: Diagnosis not present

## 2017-06-04 DIAGNOSIS — Z7951 Long term (current) use of inhaled steroids: Secondary | ICD-10-CM | POA: Diagnosis not present

## 2017-06-04 DIAGNOSIS — M797 Fibromyalgia: Secondary | ICD-10-CM | POA: Diagnosis not present

## 2017-06-04 DIAGNOSIS — E876 Hypokalemia: Secondary | ICD-10-CM | POA: Diagnosis not present

## 2017-06-04 DIAGNOSIS — R29898 Other symptoms and signs involving the musculoskeletal system: Secondary | ICD-10-CM | POA: Diagnosis not present

## 2017-06-04 DIAGNOSIS — K219 Gastro-esophageal reflux disease without esophagitis: Secondary | ICD-10-CM | POA: Diagnosis not present

## 2017-06-04 DIAGNOSIS — J45909 Unspecified asthma, uncomplicated: Secondary | ICD-10-CM | POA: Diagnosis not present

## 2017-06-04 DIAGNOSIS — D649 Anemia, unspecified: Secondary | ICD-10-CM | POA: Diagnosis not present

## 2017-06-04 DIAGNOSIS — H8109 Meniere's disease, unspecified ear: Secondary | ICD-10-CM | POA: Diagnosis not present

## 2017-06-04 DIAGNOSIS — M549 Dorsalgia, unspecified: Secondary | ICD-10-CM | POA: Diagnosis not present

## 2017-06-04 DIAGNOSIS — G43909 Migraine, unspecified, not intractable, without status migrainosus: Secondary | ICD-10-CM | POA: Diagnosis not present

## 2017-06-08 ENCOUNTER — Other Ambulatory Visit: Payer: Self-pay | Admitting: *Deleted

## 2017-06-08 ENCOUNTER — Ambulatory Visit: Payer: Self-pay | Admitting: *Deleted

## 2017-06-08 NOTE — Patient Outreach (Signed)
[facility] [facility]) Care Management  06/08/2017  Kerry Ortiz 07-14-1966 [id number]   Call received from member stating that she is not up for visitors today, request to have home visit rescheduled.  She denies the need for any urgent follow up today, home visit rescheduled for next week.  Valente David, [place], MSN [place] [phone]

## 2017-06-10 DIAGNOSIS — H8109 Meniere's disease, unspecified ear: Secondary | ICD-10-CM | POA: Diagnosis not present

## 2017-06-10 DIAGNOSIS — Z7951 Long term (current) use of inhaled steroids: Secondary | ICD-10-CM | POA: Diagnosis not present

## 2017-06-10 DIAGNOSIS — M797 Fibromyalgia: Secondary | ICD-10-CM | POA: Diagnosis not present

## 2017-06-10 DIAGNOSIS — M549 Dorsalgia, unspecified: Secondary | ICD-10-CM | POA: Diagnosis not present

## 2017-06-10 DIAGNOSIS — G43909 Migraine, unspecified, not intractable, without status migrainosus: Secondary | ICD-10-CM | POA: Diagnosis not present

## 2017-06-10 DIAGNOSIS — E876 Hypokalemia: Secondary | ICD-10-CM | POA: Diagnosis not present

## 2017-06-10 DIAGNOSIS — R29898 Other symptoms and signs involving the musculoskeletal system: Secondary | ICD-10-CM | POA: Diagnosis not present

## 2017-06-10 DIAGNOSIS — J45909 Unspecified asthma, uncomplicated: Secondary | ICD-10-CM | POA: Diagnosis not present

## 2017-06-10 DIAGNOSIS — D649 Anemia, unspecified: Secondary | ICD-10-CM | POA: Diagnosis not present

## 2017-06-10 DIAGNOSIS — K219 Gastro-esophageal reflux disease without esophagitis: Secondary | ICD-10-CM | POA: Diagnosis not present

## 2017-06-12 DIAGNOSIS — Z7951 Long term (current) use of inhaled steroids: Secondary | ICD-10-CM | POA: Diagnosis not present

## 2017-06-12 DIAGNOSIS — G43909 Migraine, unspecified, not intractable, without status migrainosus: Secondary | ICD-10-CM | POA: Diagnosis not present

## 2017-06-12 DIAGNOSIS — J45909 Unspecified asthma, uncomplicated: Secondary | ICD-10-CM | POA: Diagnosis not present

## 2017-06-12 DIAGNOSIS — H8109 Meniere's disease, unspecified ear: Secondary | ICD-10-CM | POA: Diagnosis not present

## 2017-06-12 DIAGNOSIS — M797 Fibromyalgia: Secondary | ICD-10-CM | POA: Diagnosis not present

## 2017-06-12 DIAGNOSIS — R29898 Other symptoms and signs involving the musculoskeletal system: Secondary | ICD-10-CM | POA: Diagnosis not present

## 2017-06-12 DIAGNOSIS — D649 Anemia, unspecified: Secondary | ICD-10-CM | POA: Diagnosis not present

## 2017-06-12 DIAGNOSIS — E876 Hypokalemia: Secondary | ICD-10-CM | POA: Diagnosis not present

## 2017-06-12 DIAGNOSIS — K219 Gastro-esophageal reflux disease without esophagitis: Secondary | ICD-10-CM | POA: Diagnosis not present

## 2017-06-12 DIAGNOSIS — M549 Dorsalgia, unspecified: Secondary | ICD-10-CM | POA: Diagnosis not present

## 2017-06-15 DIAGNOSIS — R531 Weakness: Secondary | ICD-10-CM | POA: Diagnosis not present

## 2017-06-15 DIAGNOSIS — R42 Dizziness and giddiness: Secondary | ICD-10-CM | POA: Diagnosis not present

## 2017-06-17 ENCOUNTER — Encounter: Payer: Self-pay | Admitting: *Deleted

## 2017-06-18 ENCOUNTER — Encounter: Payer: Self-pay | Admitting: *Deleted

## 2017-06-18 ENCOUNTER — Other Ambulatory Visit: Payer: Self-pay | Admitting: *Deleted

## 2017-06-18 NOTE — Patient Outreach (Signed)
[facility]) Care Management  06/18/2017  Kerry Ortiz [birth year] [id number]   Met with member to complete initial home visit.  [facility] care management services again explained.  She report she continues to be involved with home health, will be discharged this week due to improvement.  She expresses gratitude with assistance getting services started, state she now feel she is managing her health independently, minimal assistance from her mother.  She state her biggest concern has been the difficulty getting an early appointment with neurologist and primary MD.  She has appointments with both, on a cancellation list to get in sooner.  She has already see her primary MD once since her discharge, already an active patient at the neurology clinic (last seen 9/20).  She report her symptoms related to vertigo has decreased.    After explanation again of benefits (including Education officer, museum and pharmacy), she decides to discontinue services.  She is provided with [facility] calendar tool book as well as contact information for this care manager and 24 hour nurse contact line.  Advised to call if needs should change.   Will close case, will notify care management assistant and primary MD.  [facility] CM Care Plan Problem One     Most Recent Value  Care Plan Problem One  knowledge deficit regarding maneuving healthcare system/care coordination regarding start of care for home health  Role Documenting the Problem One  Care Management [place] for Problem One  Not Active  [facility] Long Term Goal   client will not be admitted to the hospital within the next 31 days.  [facility] Long Term Goal Start Date  05/22/17  [facility] Long Term Goal Met Date  06/18/17  [facility] for Problem One Long Term Goal  discussed medications, upcoming appointments, care coordination regarding home health start of care.encouraged client to schedule visit with neurologist.  [facility] CM Short Term Goal #1   client will verbalize contact  with home health agency within the next week.  [facility] CM Short Term Goal #1 Start Date  05/22/17  [facility] CM Short Term Goal #1 Met Date  06/02/17  Interventions for Short Term Goal #1  RNCM called home health agency regarding start of care.  [facility] CM Short Term Goal #2   client will verbalize follow up appointment scheduled with neurologist within the next week.  [facility] CM Short Term Goal #2 Start Date  05/22/17  [facility] CM Short Term Goal #2 Met Date  06/18/17  Interventions for Short Term Goal #2  Confirmed member has appointment with neurology in December, advised to stay in contact with office for cancellations in effort to have appointment moved up     Valente David, RN, MSN [place] Manager [phone]

## 2017-06-19 ENCOUNTER — Encounter: Payer: Self-pay | Admitting: *Deleted

## 2017-06-19 DIAGNOSIS — K219 Gastro-esophageal reflux disease without esophagitis: Secondary | ICD-10-CM | POA: Diagnosis not present

## 2017-06-19 DIAGNOSIS — J45909 Unspecified asthma, uncomplicated: Secondary | ICD-10-CM | POA: Diagnosis not present

## 2017-06-19 DIAGNOSIS — R29898 Other symptoms and signs involving the musculoskeletal system: Secondary | ICD-10-CM | POA: Diagnosis not present

## 2017-06-19 DIAGNOSIS — M797 Fibromyalgia: Secondary | ICD-10-CM | POA: Diagnosis not present

## 2017-06-19 DIAGNOSIS — M549 Dorsalgia, unspecified: Secondary | ICD-10-CM | POA: Diagnosis not present

## 2017-06-19 DIAGNOSIS — H8109 Meniere's disease, unspecified ear: Secondary | ICD-10-CM | POA: Diagnosis not present

## 2017-06-19 DIAGNOSIS — G43909 Migraine, unspecified, not intractable, without status migrainosus: Secondary | ICD-10-CM | POA: Diagnosis not present

## 2017-06-19 DIAGNOSIS — D649 Anemia, unspecified: Secondary | ICD-10-CM | POA: Diagnosis not present

## 2017-06-19 DIAGNOSIS — Z7951 Long term (current) use of inhaled steroids: Secondary | ICD-10-CM | POA: Diagnosis not present

## 2017-06-19 DIAGNOSIS — E876 Hypokalemia: Secondary | ICD-10-CM | POA: Diagnosis not present

## 2017-07-16 ENCOUNTER — Ambulatory Visit: Payer: Medicare Other | Admitting: Nurse Practitioner

## 2017-07-29 DIAGNOSIS — Z9181 History of falling: Secondary | ICD-10-CM | POA: Diagnosis not present

## 2017-07-29 DIAGNOSIS — E039 Hypothyroidism, unspecified: Secondary | ICD-10-CM | POA: Diagnosis not present

## 2017-07-29 DIAGNOSIS — R29898 Other symptoms and signs involving the musculoskeletal system: Secondary | ICD-10-CM | POA: Diagnosis not present

## 2017-07-29 DIAGNOSIS — G43909 Migraine, unspecified, not intractable, without status migrainosus: Secondary | ICD-10-CM | POA: Diagnosis not present

## 2017-07-30 ENCOUNTER — Ambulatory Visit: Payer: Medicare Other | Admitting: Nurse Practitioner

## 2017-08-20 ENCOUNTER — Encounter: Payer: Self-pay | Admitting: Neurology

## 2017-08-20 ENCOUNTER — Ambulatory Visit (INDEPENDENT_AMBULATORY_CARE_PROVIDER_SITE_OTHER): Payer: Medicare Other | Admitting: Neurology

## 2017-08-20 VITALS — BP 106/74 | HR 73 | Ht 62.0 in | Wt 131.0 lb

## 2017-08-20 DIAGNOSIS — R42 Dizziness and giddiness: Secondary | ICD-10-CM | POA: Diagnosis not present

## 2017-08-20 DIAGNOSIS — G43709 Chronic migraine without aura, not intractable, without status migrainosus: Secondary | ICD-10-CM | POA: Diagnosis not present

## 2017-08-20 DIAGNOSIS — IMO0002 Reserved for concepts with insufficient information to code with codable children: Secondary | ICD-10-CM

## 2017-08-20 MED ORDER — ZONISAMIDE 100 MG PO CAPS: 100.0000 mg | ORAL_CAPSULE | Freq: Two times a day (BID) | ORAL | 4 refills | Status: DC

## 2017-08-20 MED ORDER — RIZATRIPTAN BENZOATE 10 MG PO TBDP: 10.0000 mg | ORAL_TABLET | ORAL | 6 refills | Status: DC | PRN

## 2017-08-20 MED ORDER — PREGABALIN 150 MG PO CAPS: 150.0000 mg | ORAL_CAPSULE | Freq: Two times a day (BID) | ORAL | 5 refills | Status: DC

## 2017-08-20 NOTE — Progress Notes (Signed)
PATIENT: Kerry Ortiz DOB: Jun 09, 1966  Chief Complaint  Patient presents with  . Migraine    She is doing well on Zonegran '100mg'$  BID.  Her last migraine was in November 2017 and it responded well to rizatriptan.     HISTORICAL  Kerry Ortiz is a 52 year old female, seen in refer by her primary care doctor Merrilee Seashore for evaluation of chronic migraine, initial evaluation was on April 23 2017.  I reviewed and summarized the referring note, she had past medical history of acid reflux, fibromyalgia, prediabetes, hyperlipidemia, chronic migraine headaches,  She reported history of headaches since age 61, her typical migraine starting from occipital region spreading forward pounding severe headache, associated light noise sensitivity, lasting for a few hours, she has tried over-the-counter Tylenol ibuprofen with limited help, she is now getting more frequent headaches since July 2018 about 2-3 times each week,  She has been evaluated by [facility] in the past, still taking nortriptyline 25 mg 2 tablets every night, zonisamide 25 mg 3 tablets night as preventative medications,  I saw her previously for diffuse body achy pain, paresthesias years throughout her body in 2017,   she had a history of fibromyalgia, carpal tunnel release surgery in the past, chronic migraines, she went on disability since 2008, complains of diffuse body achy pain, on polypharmacy treatment, including Elavil, Lyrica, Zonegran, She continue complains of depression symptoms, tearful during today's interview  Since 2012, she began to notice bilateral feet cold burning sensation, involving bottom and top of her feet, gradually trending up to knee level, getting worse with pressure, barium weight, she has chronic low back and neck pain, denies radiating pain, gait difficulty due to pain, no bowel and bladder incontinence, no bilateral upper extremity paresthesia or weakness,  She complains of  feeling tired all the time, lack of interest, I have reviewed laboratory in July 2016, A1c was elevated 5.9, normal B12 567, TSH 1.4, CMP, she had more laboratory evaluation in August 2016  She is on polypharmacy treatment, taking Lyrica 150 mg twice a day, Celexa 10 mg daily, she still has intermittent bilateral feet paresthesia, no significant pain  EMG nerve conduction study in December 2016, there is no evidence of large fiber peripheral neuropathy  I also reviewed laboratory evaluation in November 2016:normal CBC, mildly low potassium on BMP 3.4  UPDATE Aug 20 2017: She was admitted to the hospital Oct 4 to 01/2017 with acute onset of dizziness, left lower extremity weakness, had extensive evaluations,  MRI of the brain in October 2018, that was normal.  Laboratory evaluation in October 2018, showed mild anemia hemoglobin of 11.3, CMP showed creatinine 0.85, normal TSH, A1c was 5.8, normal lipid profile, LDL was 90, negative ANA, CPK, ESR, HIV, troponin,  Echocardiogram was normal, Ultrasound of carotid artery showed less than 39% stenosis bilaterally.  Since that visit, she has intermittent dizziness, eye are twitching, ears are hurting,   REVIEW OF SYSTEMS: Full 14 system review of systems performed and notable only for weight gain, fatigue, chest pain, sweating attacks, spinning sensation itching, shortness of breath, cough, easy bruising, feeling hot, cold joint pain swelling cramps achy muscles running nose, headaches, weakness, dizziness restless leg, too much sleep disinteresting activities  ALLERGIES: No Known Allergies  HOME MEDICATIONS: Current Outpatient Medications  Medication Sig Dispense Refill  . albuterol (PROVENTIL HFA;VENTOLIN HFA) 108 (90 BASE) MCG/ACT inhaler Inhale 2 puffs into the lungs every 6 (six) hours as needed for wheezing or shortness of  breath. 1 Inhaler 2  . amitriptyline (ELAVIL) 25 MG tablet Take 25 mg by mouth at bedtime.    . citalopram (CELEXA)  10 MG tablet Take 1 tablet (10 mg total) by mouth daily. 30 tablet 6  . diclofenac (VOLTAREN) 75 MG EC tablet Take 75 mg by mouth 2 (two) times daily.    Marland Kitchen esomeprazole (NEXIUM) 40 MG capsule Take 40 mg by mouth daily before breakfast.    . fluticasone (FLONASE) 50 MCG/ACT nasal spray Place 2 sprays into both nostrils daily. (Patient taking differently: Place 2 sprays into both nostrils daily as needed for allergies or rhinitis. ) 16 g 2  . ibuprofen (ADVIL,MOTRIN) 800 MG tablet Take 1 tablet (800 mg total) by mouth every 8 (eight) hours as needed for mild pain or moderate pain (pain.). (Patient taking differently: Take 800 mg by mouth every 8 (eight) hours as needed (for pain). ) 30 tablet 0  . meclizine (ANTIVERT) 25 MG tablet Take 1 tablet (25 mg total) by mouth 3 (three) times daily as needed for dizziness. 30 tablet 0  . ondansetron (ZOFRAN ODT) 4 MG disintegrating tablet Take 1 tablet (4 mg total) by mouth every 8 (eight) hours as needed. (Patient taking differently: Take 4 mg by mouth every 8 (eight) hours as needed for nausea or vomiting. ) 20 tablet 6  . pregabalin (LYRICA) 150 MG capsule Take 150 mg by mouth 2 (two) times daily.     . rizatriptan (MAXALT-MLT) 10 MG disintegrating tablet Take 1 tablet (10 mg total) by mouth as needed. May repeat in 2 hours if needed (Patient taking differently: Take 10 mg by mouth once as needed for migraine. May repeat in 2 hours if no relief) 15 tablet 6  . Spacer/Aero-Holding Chambers (AEROCHAMBER PLUS FLO-VU LARGE) MISC 1 each by Other route once. 1 each 0  . sucralfate (CARAFATE) 1 G tablet Take 1 tablet (1 g total) by mouth 4 (four) times daily. (Patient taking differently: Take 1 g by mouth every morning. ) 20 tablet 0  . tiZANidine (ZANAFLEX) 4 MG tablet Take 4 mg by mouth every 8 (eight) hours as needed for muscle spasms.   2  . zonisamide (ZONEGRAN) 100 MG capsule Take 1 capsule (100 mg total) by mouth 2 (two) times daily. 60 capsule 11   No current  facility-administered medications for this visit.     PAST MEDICAL HISTORY: Past Medical History:  Diagnosis Date  . Acid reflux   . Asthma   . Disturbance of skin sensation   . Fibromyalgia   . Migraine   . Uterine fibroid     PAST SURGICAL HISTORY: Past Surgical History:  Procedure Laterality Date  . CARPAL TUNNEL RELEASE    . CYSTECTOMY    . HAND SURGERY Right   . SHOULDER SURGERY Right     FAMILY HISTORY: Family History  Problem Relation Age of Onset  . Hypertension Mother   . Arrhythmia Mother   . Thyroid disease Mother   . Pneumonia Father   . Diabetes Maternal Grandmother   . Kidney disease Sister   . Multiple sclerosis Cousin     SOCIAL HISTORY:  Social History   Socioeconomic History  . Marital status: Single    Spouse name: Not on file  . Number of children: 1  . Years of education: HS  . Highest education level: Not on file  Social Needs  . Financial resource strain: Not on file  . Food insecurity - worry: Not on  file  . Food insecurity - inability: Not on file  . Transportation needs - medical: Not on file  . Transportation needs - non-medical: Not on file  Occupational History  . Occupation: Disabled  Tobacco Use  . Smoking status: Never Smoker  . Smokeless tobacco: Never Used  Substance and Sexual Activity  . Alcohol use: No  . Drug use: No  . Sexual activity: Not Currently    Birth control/protection: None  Other Topics Concern  . Not on file  Social History Narrative   Lives at home alone.   Right-handed.   No caffeine use.     PHYSICAL EXAM   Vitals:   08/20/17 1156  BP: 106/74  Pulse: 73  Weight: 131 lb (59.4 kg)  Height: '5\' 2"'$  (1.575 m)    Not recorded      Body mass index is 23.96 kg/m.  PHYSICAL EXAMNIATION:  Gen: NAD, conversant, well nourised, obese, well groomed                     Cardiovascular: Regular rate rhythm, no peripheral edema, warm, nontender. Eyes: Conjunctivae clear without exudates or  hemorrhage Neck: Supple, no carotid bruits. Pulmonary: Clear to auscultation bilaterally   NEUROLOGICAL EXAM:  MENTAL STATUS: Speech:    Speech is normal; fluent and spontaneous with normal comprehension.  Cognition:     Orientation to time, place and person     Normal recent and remote memory     Normal Attention span and concentration     Normal Language, naming, repeating,spontaneous speech     Fund of knowledge   CRANIAL NERVES: CN II: Visual fields are full to confrontation. Fundoscopic exam is normal with sharp discs and no vascular changes. Pupils are round equal and briskly reactive to light. CN III, IV, VI: extraocular movement are normal. No ptosis. CN V: Facial sensation is intact to pinprick in all 3 divisions bilaterally. Corneal responses are intact.  CN VII: Face is symmetric with normal eye closure and smile. CN VIII: Hearing is normal to rubbing fingers CN IX, X: Palate elevates symmetrically. Phonation is normal. CN XI: Head turning and shoulder shrug are intact CN XII: Tongue is midline with normal movements and no atrophy.  MOTOR: There is no pronator drift of out-stretched arms. Muscle bulk and tone are normal. Muscle strength is normal.  REFLEXES: Reflexes are 2+ and symmetric at the biceps, triceps, knees, and ankles. Plantar responses are flexor.  SENSORY: Intact to light touch, pinprick, positional sensation and vibratory sensation are intact in fingers and toes.  COORDINATION: Rapid alternating movements and fine finger movements are intact. There is no dysmetria on finger-to-nose and heel-knee-shin.    GAIT/STANCE: Posture is normal. Gait is steady with normal steps, base, arm swing, and turning. Heel and toe walking are normal. Tandem gait is normal.  Romberg is absent.   DIAGNOSTIC DATA (LABS, IMAGING, TESTING) - I reviewed patient records, labs, notes, testing and imaging myself where available.   ASSESSMENT AND PLAN  Kerry Ortiz is a  52 y.o. female   Chronic migraine headache  Increased preventive medication zonisamide to 100 mg twice a day  Maxalt as needed, plus Zofran as needed  New onset dizziness:  Extensive evaluation failed to demonstrate etiology  Continue follow-up with primary care physician    Marcial Pacas, M.D. Ph.D.  [facility] [address], [place], [place] [address] Ph: [phone] Fax: [phone]  CC: Merrilee Seashore, MD

## 2017-08-25 DIAGNOSIS — N914 Secondary oligomenorrhea: Secondary | ICD-10-CM | POA: Diagnosis not present

## 2017-08-25 DIAGNOSIS — Z01419 Encounter for gynecological examination (general) (routine) without abnormal findings: Secondary | ICD-10-CM | POA: Diagnosis not present

## 2017-09-02 DIAGNOSIS — M791 Myalgia, unspecified site: Secondary | ICD-10-CM | POA: Diagnosis not present

## 2017-09-02 DIAGNOSIS — R5383 Other fatigue: Secondary | ICD-10-CM | POA: Diagnosis not present

## 2017-09-02 DIAGNOSIS — H9201 Otalgia, right ear: Secondary | ICD-10-CM | POA: Insufficient documentation

## 2017-09-02 DIAGNOSIS — M797 Fibromyalgia: Secondary | ICD-10-CM | POA: Diagnosis not present

## 2017-09-02 DIAGNOSIS — M26623 Arthralgia of bilateral temporomandibular joint: Secondary | ICD-10-CM | POA: Diagnosis not present

## 2017-09-02 DIAGNOSIS — M2550 Pain in unspecified joint: Secondary | ICD-10-CM | POA: Diagnosis not present

## 2017-09-02 DIAGNOSIS — H9203 Otalgia, bilateral: Secondary | ICD-10-CM | POA: Diagnosis not present

## 2017-09-02 DIAGNOSIS — M26629 Arthralgia of temporomandibular joint, unspecified side: Secondary | ICD-10-CM | POA: Insufficient documentation

## 2017-09-04 DIAGNOSIS — R7309 Other abnormal glucose: Secondary | ICD-10-CM | POA: Diagnosis not present

## 2017-09-04 DIAGNOSIS — L709 Acne, unspecified: Secondary | ICD-10-CM | POA: Diagnosis not present

## 2017-09-04 DIAGNOSIS — K219 Gastro-esophageal reflux disease without esophagitis: Secondary | ICD-10-CM | POA: Diagnosis not present

## 2017-09-04 DIAGNOSIS — M797 Fibromyalgia: Secondary | ICD-10-CM | POA: Diagnosis not present

## 2017-09-15 DIAGNOSIS — J209 Acute bronchitis, unspecified: Secondary | ICD-10-CM | POA: Diagnosis not present

## 2017-09-15 DIAGNOSIS — R05 Cough: Secondary | ICD-10-CM | POA: Diagnosis not present

## 2017-09-23 ENCOUNTER — Ambulatory Visit (INDEPENDENT_AMBULATORY_CARE_PROVIDER_SITE_OTHER): Payer: Medicare Other

## 2017-09-23 ENCOUNTER — Ambulatory Visit (HOSPITAL_COMMUNITY)
Admission: EM | Admit: 2017-09-23 | Discharge: 2017-09-23 | Disposition: A | Discharge status: Home / Self Care | Payer: Medicare Other | Attending: Family Medicine | Admitting: Family Medicine

## 2017-09-23 ENCOUNTER — Encounter (HOSPITAL_COMMUNITY): Payer: Self-pay | Admitting: Family Medicine

## 2017-09-23 DIAGNOSIS — R05 Cough: Secondary | ICD-10-CM

## 2017-09-23 DIAGNOSIS — J029 Acute pharyngitis, unspecified: Secondary | ICD-10-CM | POA: Diagnosis not present

## 2017-09-23 DIAGNOSIS — R0981 Nasal congestion: Secondary | ICD-10-CM | POA: Diagnosis not present

## 2017-09-23 DIAGNOSIS — J399 Disease of upper respiratory tract, unspecified: Secondary | ICD-10-CM | POA: Diagnosis not present

## 2017-09-23 DIAGNOSIS — J40 Bronchitis, not specified as acute or chronic: Secondary | ICD-10-CM | POA: Diagnosis not present

## 2017-09-23 DIAGNOSIS — R059 Cough, unspecified: Secondary | ICD-10-CM

## 2017-09-23 MED ORDER — IPRATROPIUM BROMIDE 0.06 % NA SOLN: 2.0000 | Freq: Four times a day (QID) | NASAL | 0 refills | Status: DC

## 2017-09-23 MED ORDER — PREDNISONE 20 MG PO TABS: 40.0000 mg | ORAL_TABLET | Freq: Every day | ORAL | 0 refills | Status: AC

## 2017-09-23 MED ORDER — MELOXICAM 7.5 MG PO TABS: 7.5000 mg | ORAL_TABLET | Freq: Every day | ORAL | 0 refills | Status: DC

## 2017-09-23 MED ORDER — FLUTICASONE PROPIONATE 50 MCG/ACT NA SUSP: 2.0000 | Freq: Every day | NASAL | 0 refills | Status: DC

## 2017-09-23 NOTE — ED Triage Notes (Signed)
Pt here for cough and congestion x 2 weeks. She has been dx with the flu and URI. She is currently taking prescribed cough meds and abx. She reports that she is not getting better and that her chest hurts when she coughs.

## 2017-09-23 NOTE — Discharge Instructions (Signed)
Chest xray negative for pneumonia. Mobic for pain. Do not take ibuprofen (advil, motrin)/naproxen (aleve) while on mobic. Start prednisone as directed. Start flonase, atrovent nasal spray for nasal congestion/drainage. You can use over the counter nasal saline rinse such as neti pot for nasal congestion. Keep hydrated, your urine should be clear to pale yellow in color. Tylenol/motrin for fever and pain. Monitor for any worsening of symptoms, chest pain, shortness of breath, wheezing, swelling of the throat, follow up for reevaluation. Otherwise follow up with PCP for further evaluation.  For sore throat try using a honey-based tea. Use 3 teaspoons of honey with juice squeezed from half lemon. Place shaved pieces of ginger into 1/2-1 cup of water and warm over stove top. Then mix the ingredients and repeat every 4 hours as needed.

## 2017-09-23 NOTE — ED Provider Notes (Signed)
[facility]    CSN: [id number] Arrival date & time: 09/23/17  1001     History   Chief Complaint Chief Complaint  Patient presents with  . Cough    HPI Kerry Ortiz is a 52 y.o. female.   52 year old female with history of asthma comes in for 2-week history of URI symptoms.  States that when symptoms first started, she had fever with T-max of 103, rhinorrhea, nasal congestion, productive cough.  She was diagnosed with the flu during that time, and was given cough medicine with codeine.  She returned a week later due to continued symptoms, at that time PCP prescribed her amoxicillin and another cough medication without relief.  She is here today for continued productive cough, rhinorrhea, nasal congestion.  Fevers have since resolved.  She continues to have muscle aches, now with chest pain/substernal pain during cough.  States coughing worse during the night.  She returned to her PCP office this morning, states was "swabbed my nose" and will need to wait 24 hours for results, she  is not sure where she was tested for.  States she came in as she is unable to tolerate a cough anymore, and did not want to wait for 24 hours.  States she has had increased use of albuterol, has been using it 3 times a day.  She usually uses albuterol 1-2 times a day.  She continues to take her amoxicillin and both cough syrup without relief.      Past Medical History:  Diagnosis Date  . Acid reflux   . Asthma   . Disturbance of skin sensation   . Fibromyalgia   . Migraine   . Uterine fibroid     Patient Active Problem List   Diagnosis Date Noted  . Leg weakness 05/07/2017  . Hypokalemia 05/07/2017  . Anemia 05/07/2017  . Vertigo 05/07/2017  . Chronic migraine 04/23/2017  . Paresthesia 02/07/2016  . ABDOMINAL BLOATING 11/08/2008  . EPIGASTRIC PAIN 11/08/2008  . DEPRESSION 10/31/2008  . OSTEOARTHRITIS 10/31/2008  . FIBROMYALGIA 10/31/2008  . HEADACHE, CHRONIC 10/31/2008  .  ABDOMINAL PAIN, RECURRENT 10/31/2008  . CARPAL TUNNEL SYNDROME, HX OF 10/31/2008    Past Surgical History:  Procedure Laterality Date  . CARPAL TUNNEL RELEASE    . CYSTECTOMY    . HAND SURGERY Right   . SHOULDER SURGERY Right     OB History    No data available       Home Medications    Prior to Admission medications   Medication Sig Start Date End Date Taking? Authorizing Provider  albuterol (PROVENTIL HFA;VENTOLIN HFA) 108 (90 BASE) MCG/ACT inhaler Inhale 2 puffs into the lungs every 6 (six) hours as needed for wheezing or shortness of breath. 10/24/14   Gregor Hams, MD  amitriptyline (ELAVIL) 25 MG tablet Take 25 mg by mouth at bedtime. 04/23/15   [provider]  citalopram (CELEXA) 10 MG tablet Take 1 tablet (10 mg total) by mouth daily. 05/03/15   Marcial Pacas, MD  esomeprazole (NEXIUM) 40 MG capsule Take 40 mg by mouth daily before breakfast.    [provider]  fluticasone (FLONASE) 50 MCG/ACT nasal spray Place 2 sprays into both nostrils daily. 09/23/17   Tasia Catchings, Amy V, PA-C  ipratropium (ATROVENT) 0.06 % nasal spray Place 2 sprays into both nostrils 4 (four) times daily. 09/23/17   Ok Edwards, PA-C  meclizine (ANTIVERT) 25 MG tablet Take 1 tablet (25 mg total) by mouth 3 (three)  times daily as needed for dizziness. 05/09/17   Mikhail, Velta Addison, DO  meloxicam (MOBIC) 7.5 MG tablet Take 1 tablet (7.5 mg total) by mouth daily. 09/23/17   Tasia Catchings, Amy V, PA-C  ondansetron (ZOFRAN ODT) 4 MG disintegrating tablet Take 1 tablet (4 mg total) by mouth every 8 (eight) hours as needed. Patient taking differently: Take 4 mg by mouth every 8 (eight) hours as needed for nausea or vomiting.  04/23/17   Marcial Pacas, MD  predniSONE (DELTASONE) 20 MG tablet Take 2 tablets (40 mg total) by mouth daily for 4 days. 09/23/17 09/27/17  Ok Edwards, PA-C  pregabalin (LYRICA) 150 MG capsule Take 1 capsule (150 mg total) by mouth 2 (two) times daily. 08/20/17   Marcial Pacas, MD  rizatriptan (MAXALT-MLT) 10  MG disintegrating tablet Take 1 tablet (10 mg total) by mouth as needed. May repeat in 2 hours if needed 08/20/17   Marcial Pacas, MD  Spacer/Aero-Holding Chambers (AEROCHAMBER PLUS FLO-VU LARGE) MISC 1 each by Other route once. 09/23/13   Liam Graham, PA-C  sucralfate (CARAFATE) 1 G tablet Take 1 tablet (1 g total) by mouth 4 (four) times daily. Patient taking differently: Take 1 g by mouth every morning.  07/11/12   Malvin Johns, MD  tiZANidine (ZANAFLEX) 4 MG tablet Take 4 mg by mouth every 8 (eight) hours as needed for muscle spasms.  04/15/17   [provider]  zonisamide (ZONEGRAN) 100 MG capsule Take 1 capsule (100 mg total) by mouth 2 (two) times daily. 08/20/17   Marcial Pacas, MD    Family History Family History  Problem Relation Age of Onset  . Hypertension Mother   . Arrhythmia Mother   . Thyroid disease Mother   . Pneumonia Father   . Diabetes Maternal Grandmother   . Kidney disease Sister   . Multiple sclerosis Cousin     Social History Social History   Tobacco Use  . Smoking status: Never Smoker  . Smokeless tobacco: Never Used  Substance Use Topics  . Alcohol use: No  . Drug use: No     Allergies   Patient has no known allergies.   Review of Systems Review of Systems  Reason unable to perform ROS: See HPI as above.     Physical Exam Triage Vital Signs ED Triage Vitals  Enc Vitals Group     BP 09/23/17 1020 105/68     Pulse Rate 09/23/17 1020 77     Resp 09/23/17 1020 18     Temp 09/23/17 1020 98.4 F (36.9 C)     Temp src --      SpO2 09/23/17 1020 100 %     Weight --      Height --      Head Circumference --      Peak Flow --      Pain Score 09/23/17 1018 7     Pain Loc --      Pain Edu? --      Excl. in [place]? --    No data found.  Updated Vital Signs BP 105/68   Pulse 77   Temp 98.4 F (36.9 C)   Resp 18   SpO2 100%   Physical Exam  Constitutional: She is oriented to person, place, and time. She appears well-developed and  well-nourished. No distress.  HENT:  Head: Normocephalic and atraumatic.  Right Ear: Tympanic membrane, external ear and ear canal normal. Tympanic membrane is not erythematous and not bulging.  Left  Ear: Tympanic membrane, external ear and ear canal normal. Tympanic membrane is not erythematous and not bulging.  Nose: Rhinorrhea present. Right sinus exhibits no maxillary sinus tenderness and no frontal sinus tenderness. Left sinus exhibits no maxillary sinus tenderness and no frontal sinus tenderness.  Mouth/Throat: Uvula is midline, oropharynx is clear and moist and mucous membranes are normal.  Eyes: Conjunctivae are normal. Pupils are equal, round, and reactive to light.  Neck: Normal range of motion. Neck supple.  Cardiovascular: Normal rate, regular rhythm and normal heart sounds. Exam reveals no gallop and no friction rub.  No murmur heard. Pulmonary/Chest: Effort normal and breath sounds normal. She has no decreased breath sounds. She has no wheezes. She has no rhonchi. She has no rales. She exhibits tenderness.  Lymphadenopathy:    She has no cervical adenopathy.  Neurological: She is alert and oriented to person, place, and time.  Skin: Skin is warm and dry.  Psychiatric: She has a normal mood and affect. Her behavior is normal. Judgment normal.   UC Treatments / Results  Labs (all labs ordered are listed, but only abnormal results are displayed) Labs Reviewed - No data to display  EKG  EKG Interpretation None       Radiology Dg Chest 2 View  Result Date: 09/23/2017 CLINICAL DATA:  Cough for 2 weeks EXAM: CHEST  2 VIEW COMPARISON:  05/08/2017 FINDINGS: The heart size and mediastinal contours are within normal limits. Both lungs are clear. The visualized skeletal structures are unremarkable. IMPRESSION: No active cardiopulmonary disease. Electronically Signed   By: Kathreen Devoid   On: 09/23/2017 11:03    Procedures Procedures (including critical care time)  Medications  Ordered in UC Medications - No data to display   Initial Impression / Assessment and Plan / UC Course  I have reviewed the triage vital signs and the nursing notes.  Pertinent labs & imaging results that were available during my care of the patient were reviewed by me and considered in my medical decision making (see chart for details).    CXR negative for pneumonia. Continue amoxicillin as directed. Symptomatic treatment discussed. Return precautions given. Patient expresses understanding and agrees to plan.   Final Clinical Impressions(s) / UC Diagnoses   Final diagnoses:  Cough    ED Discharge Orders        Ordered    meloxicam (MOBIC) 7.5 MG tablet  Daily     09/23/17 1129    predniSONE (DELTASONE) 20 MG tablet  Daily     09/23/17 1129    fluticasone (FLONASE) 50 MCG/ACT nasal spray  Daily     09/23/17 1129    ipratropium (ATROVENT) 0.06 % nasal spray  4 times daily     09/23/17 1129       Ok Edwards, [place] 09/23/17 1824

## 2017-09-29 DIAGNOSIS — J399 Disease of upper respiratory tract, unspecified: Secondary | ICD-10-CM | POA: Diagnosis not present

## 2017-09-29 DIAGNOSIS — J309 Allergic rhinitis, unspecified: Secondary | ICD-10-CM | POA: Diagnosis not present

## 2017-09-29 DIAGNOSIS — R05 Cough: Secondary | ICD-10-CM | POA: Diagnosis not present

## 2017-09-29 DIAGNOSIS — K219 Gastro-esophageal reflux disease without esophagitis: Secondary | ICD-10-CM | POA: Diagnosis not present

## 2017-10-07 ENCOUNTER — Other Ambulatory Visit: Payer: Self-pay | Admitting: Neurology

## 2017-10-07 DIAGNOSIS — J45998 Other asthma: Secondary | ICD-10-CM | POA: Diagnosis not present

## 2017-10-07 DIAGNOSIS — R05 Cough: Secondary | ICD-10-CM | POA: Diagnosis not present

## 2017-10-07 DIAGNOSIS — Z23 Encounter for immunization: Secondary | ICD-10-CM | POA: Diagnosis not present

## 2017-10-12 ENCOUNTER — Institutional Professional Consult (permissible substitution): Payer: Medicare Other | Admitting: Internal Medicine

## 2017-11-03 ENCOUNTER — Other Ambulatory Visit (INDEPENDENT_AMBULATORY_CARE_PROVIDER_SITE_OTHER): Payer: Medicare Other

## 2017-11-03 ENCOUNTER — Encounter: Payer: Self-pay | Admitting: Internal Medicine

## 2017-11-03 ENCOUNTER — Ambulatory Visit (INDEPENDENT_AMBULATORY_CARE_PROVIDER_SITE_OTHER): Payer: Medicare Other | Admitting: Internal Medicine

## 2017-11-03 VITALS — BP 110/72 | HR 103 | Ht 62.0 in | Wt 129.8 lb

## 2017-11-03 DIAGNOSIS — R058 Other specified cough: Secondary | ICD-10-CM

## 2017-11-03 DIAGNOSIS — R05 Cough: Secondary | ICD-10-CM | POA: Insufficient documentation

## 2017-11-03 LAB — CBC WITH DIFFERENTIAL/PLATELET
BASOS ABS: 0 10*3/uL (ref 0.0–0.1)
Basophils Relative: 0.7 % (ref 0.0–3.0)
Eosinophils Absolute: 0 10*3/uL (ref 0.0–0.7)
Eosinophils Relative: 0.6 % (ref 0.0–5.0)
HEMATOCRIT: 41.7 % (ref 36.0–46.0)
HEMOGLOBIN: 13.5 g/dL (ref 12.0–15.0)
LYMPHS PCT: 43.4 % (ref 12.0–46.0)
Lymphs Abs: 2 10*3/uL (ref 0.7–4.0)
MCHC: 32.4 g/dL (ref 30.0–36.0)
MCV: 75.4 fl — AB (ref 78.0–100.0)
MONOS PCT: 6 % (ref 3.0–12.0)
Monocytes Absolute: 0.3 10*3/uL (ref 0.1–1.0)
NEUTROS ABS: 2.2 10*3/uL (ref 1.4–7.7)
Neutrophils Relative %: 49.3 % (ref 43.0–77.0)
Platelets: 373 10*3/uL (ref 150.0–400.0)
RBC: 5.53 Mil/uL — AB (ref 3.87–5.11)
RDW: 15.8 % — ABNORMAL HIGH (ref 11.5–15.5)
WBC: 4.5 10*3/uL (ref 4.0–10.5)

## 2017-11-03 LAB — NITRIC OXIDE: Nitric Oxide: 5

## 2017-11-03 MED ORDER — ESOMEPRAZOLE MAGNESIUM 40 MG PO CPDR
DELAYED_RELEASE_CAPSULE | ORAL | 2 refills | Status: DC
Start: 2017-11-03 — End: 2018-02-23

## 2017-11-03 MED ORDER — ACETAMINOPHEN-CODEINE #3 300-30 MG PO TABS: 1.0000 | ORAL_TABLET | ORAL | 0 refills | Status: AC | PRN

## 2017-11-03 NOTE — Progress Notes (Signed)
Subjective:     Patient ID: Kerry Ortiz, female   DOB: 19-Mar-1966,     MRN: [id number]  HPI  89 yobf never smoker ? Asthma as infant and in HS developed recurrent abd problems dx as ulcers / gerd then around 2005 dx as fibromyalgia/ migraines then around 2015 developed indolent onset daily/noct cough dx as asthma > not much better p started rx with inhalers never returned to ER until 09/23/17 > no better with pred/duler a200 so referred to pulmonary clinic 11/03/2017 by Dr   Gayla Doss   11/03/2017 1st [facility] office visit/ Kerry Ortiz   Chief Complaint  Patient presents with  . Pulmonary Consult    Referred by Dr. Ashby Dawes for eval of Asthma.  She is c/o cough x 2 months. She is coughing up some yellow to clear sputum "feels like I'm choking".  She states the cough tends to get worse in the evenings and also when she gets hot.  She uses her albuterol inhaler 3 x daily on average.    Kouffman Reflux v Neurogenic Cough Differentiator Reflux Comments  Do you awaken from a sound sleep coughing violently?                            With trouble breathing? Yes nightly yes   Do you have choking episodes when you cannot  Get enough air, gasping for air ?              Yes   Do you usually cough when you lie down into  The bed, or when you just lie down to rest ?                          After a while = couple of min   Do you usually cough after meals or eating?         No change   Do you cough when (or after) you bend over?    Yes occ    GERD SCORE     Kouffman Reflux v Neurogenic Cough Differentiator Neurogenic   Do you more-or-less cough all day long? sporadic   Does change of temperature make you cough? Hot worse   Does laughing or chuckling cause you to cough? yes   Do fumes (perfume, automobile fumes, burned  Toast, etc.,) cause you to cough ?      Yes    Does speaking, singing, or talking on the phone cause you to cough   ?               Yes    Neurogenic/Airway score        Cough drops make it worse/ sob mostly with coughing / some gen ant chest discomfort during coughing fits   No obvious other patterns in day to day or daytime variability or assoc excess/ purulent sputum or mucus plugs or hemoptysis or cp or chest tightness, subjective wheeze or overt sinus or hb symptoms. No unusual exposure hx or h/o childhood pna/  or knowledge of premature birth.  Sleeping ok flat without nocturnal  or early am exacerbation  of respiratory  c/o's or need for noct saba. Also denies any obvious fluctuation of symptoms with weather or environmental changes or other aggravating or alleviating factors except as outlined above   Current Allergies, Complete Past Medical History, Past Surgical History, Family History, and Social History were reviewed in [employer]  electronic medical record.  ROS  The following are not active complaints unless bolded Hoarseness, sore throat, dysphagia, dental problems, itching, sneezing,  nasal congestion or discharge of excess mucus or purulent secretions, ear ache,   fever, chills, sweats, unintended wt loss or wt gain, classically pleuritic or exertional cp,  orthopnea pnd or leg swelling, presyncope, palpitations, abdominal pain, anorexia, nausea, vomiting, diarrhea  or change in bowel habits or change in bladder habits, change in stools or change in urine, dysuria, hematuria,  rash, arthralgias, visual complaints, headache, numbness, weakness or ataxia or problems with walking or coordination,  change in mood/affect or memory.        Current Meds  Medication Sig  . albuterol (PROVENTIL HFA;VENTOLIN HFA) 108 (90 BASE) MCG/ACT inhaler Inhale 2 puffs into the lungs every 6 (six) hours as needed for wheezing or shortness of breath.  Marland Kitchen amitriptyline (ELAVIL) 25 MG tablet Take 25 mg by mouth at bedtime.  . citalopram (CELEXA) 10 MG tablet Take 1 tablet (10 mg total) by mouth daily.  . fluticasone (FLONASE) 50 MCG/ACT nasal spray Place 2 sprays  into both nostrils daily.  . meclizine (ANTIVERT) 25 MG tablet Take 1 tablet (25 mg total) by mouth 3 (three) times daily as needed for dizziness.  . ondansetron (ZOFRAN ODT) 4 MG disintegrating tablet Take 1 tablet (4 mg total) by mouth every 8 (eight) hours as needed. (Patient taking differently: Take 4 mg by mouth every 8 (eight) hours as needed for nausea or vomiting. )  . pregabalin (LYRICA) 150 MG capsule Take 1 capsule (150 mg total) by mouth 2 (two) times daily.  . pregabalin (LYRICA) 150 MG capsule 1 tablet 2 (two) times daily.  . rizatriptan (MAXALT-MLT) 10 MG disintegrating tablet Take 1 tablet (10 mg total) by mouth as needed. May repeat in 2 hours if needed  . Spacer/Aero-Holding Chambers (AEROCHAMBER PLUS FLO-VU LARGE) MISC 1 each by Other route once.  . sucralfate (CARAFATE) 1 G tablet Take 1 tablet (1 g total) by mouth 4 (four) times daily. (Patient taking differently: Take 1 g by mouth every morning. )  . tiZANidine (ZANAFLEX) 4 MG tablet Take 4 mg by mouth every 8 (eight) hours as needed for muscle spasms.   Marland Kitchen zonisamide (ZONEGRAN) 100 MG capsule Take 1 capsule (100 mg total) by mouth 2 (two) times daily.  . [  esomeprazole (NEXIUM) 40 MG capsule Take 40 mg by mouth daily before breakfast.  . [  mometasone-formoterol (DULERA) 200-5 MCG/ACT AERO Inhale 2 puffs into the lungs 2 (two) times daily.  Marland Kitchen   PROMETHAZINE VC PLAIN 6.25-5 MG/5ML SOLN Take 5 mLs by mouth every 6 (six) hours as needed.         Review of Systems     Objective:   Physical Exam    amb bf nad  Wt Readings from Last 3 Encounters:  11/03/17 129 lb 12.8 oz (58.9 kg)  08/20/17 131 lb (59.4 kg)  06/18/17 130 lb (59 kg)     Vital signs reviewed - Note on arrival 02 sats  100% on RA      HEENT: nl dentition, turbinates bilaterally, and oropharynx. Nl external ear canals without cough reflex   NECK :  without JVD/Nodes/TM/ nl carotid upstrokes bilaterally   LUNGS: no acc muscle use,  Nl contour chest  which is clear to A and P bilaterally with no cough on insp/exp  CV:  RRR  no s3 or murmur or increase in P2, and no edema  ABD:  soft and nontender with nl inspiratory excursion in the supine position. No bruits or organomegaly appreciated, bowel sounds nl  MS:  Nl gait/ ext warm without deformities, calf tenderness, cyanosis or clubbing No obvious joint restrictions   SKIN: warm and dry without lesions    NEURO:  alert, approp, nl sensorium with  no motor or cerebellar deficits apparent.       I personally reviewed images and agree with radiology impression as follows:  CXR:   09/23/17 No active cardiopulmonary disease.  CXR 10/07/17 also reviewed:  Nl    Labs ordered 11/03/2017   Allergy profile  Assessment:

## 2017-11-03 NOTE — Assessment & Plan Note (Addendum)
Onset around 2015 -  MRI sinuse  05/07/17 Sinuses/Orbits: Globes and orbital soft tissues normal. Paranasal sinuses clear. No mastoid effusion. Inner ear structures normal. - FENO 11/03/2017  =   5 on dulera 200 2bid with no improvement in cough > d/c 11/03/2017  -  Allergy profile 11/03/2017 >  Eos 0. /  IgE  Pending  - cyclical cough regimen started 11/03/2017    Upper airway cough syndrome (previously labeled PNDS),  is so named because it's frequently impossible to sort out how much is  CR/sinusitis with freq throat clearing (which can be related to primary GERD)   vs  causing  secondary (" extra esophageal")  GERD from wide swings in gastric pressure that occur with throat clearing, often  promoting self use of mint and menthol lozenges that reduce the lower esophageal sphincter tone and exacerbate the problem further in a cyclical fashion.   These are the same pts (now being labeled as having "irritable larynx syndrome" by some cough centers) who not infrequently have a history of having failed to tolerate ace inhibitors,  dry powder inhalers (and sometimes even hfa ICS esp in higher doses)  or biphosphonates or report having atypical/extraesophageal reflux symptoms that don't respond to standard doses of PPI  and are easily confused as having aecopd or asthma flares by even experienced allergists/ pulmonologists (myself included).    Of the three most common causes of  Sub-acute or recurrent or chronic cough, only one (GERD)  can actually contribute to/ trigger  the other two (asthma and post nasal drip syndrome)  and perpetuate the cylce of cough.  While not intuitively obvious, many patients with chronic low grade reflux(for which she has a hx) do not cough until there is a primary insult that disturbs the protective epithelial barrier and exposes sensitive nerve endings.   This is typically viral but can be direct physical injury such as with an endotracheal tube.   The point is that once this  occurs, it is difficult to eliminate the cycle  using anything but a maximally effective acid suppression regimen at least in the short run, accompanied by an appropriate diet to address non acid GERD and control / eliminate the cough itself for at least 3 days with tylenol #3.   Will regroup in 2 weeks   with all meds in hand using a trust but verify approach to confirm accurate Medication  Reconciliation The principal here is that until we are certain that the  patients are doing what we've asked, it makes no sense to ask them to do more.    Total time devoted to counseling  > 50 % of initial 60 min office visit:  review case with pt/ discussion of options/alternatives/ personally creating written customized instructions  in presence of pt  then going over those specific  Instructions directly with the pt including how to use all of the meds but in particular covering each new medication in detail and the difference between the maintenance= "automatic" meds and the prns using an action plan format for the latter (If this problem/symptom => do that organization reading Left to right).  Please see AVS from this visit for a full list of these instructions which I personally wrote for this pt and  are unique to this visit.

## 2017-11-03 NOTE — Patient Instructions (Addendum)
Change nexium to  Where you take it Take 30- 60 min before your first and last meals of the day   Take delsym two tsp every 12 hours and supplement if needed with  Tylenol #3  up to 1-2 every 4 hours to suppress the urge to cough. Swallowing water and/or using ice chips/non mint and menthol containing candies (such as lifesavers or sugarless jolly ranchers) are also effective.  You should rest your voice and avoid activities that you know make you cough.  Once you have eliminated the cough for 3 straight days try reducing the Tylenol #3  first,  then the delsym as tolerated.     Please remember to go to the lab department downstairs in the basement  for your tests - we will call you with the results when they are available.       Please schedule a follow up office visit in 2 weeks, sooner if needed  with all medications /inhalers/ solutions in hand so we can verify exactly what you are taking. This includes all medications from all doctors and over the counters  next:  Consider 1st gen H1 blockers per guidelines / methacholine challenge while on max gerd rx

## 2017-11-04 LAB — RESPIRATORY ALLERGY PROFILE REGION II ~~LOC~~
Allergen, A. alternata, m6: <0.1 kU/L
Allergen, Comm Silver Birch, t9: <0.1 kU/L
Box Elder IgE: <0.1 kU/L
CLADOSPORIUM HERBARUM (M2) IGE: <0.1 kU/L
CLASS: 0
CLASS: 0
CLASS: 0
CLASS: 0
CLASS: 0
CLASS: 0
CLASS: 0
CLASS: 0
CLASS: 0
CLASS: 0
CLASS: 0
CLASS: 0
CLASS: 0
COCKROACH: 0.23 kU/L — AB
COMMON RAGWEED (SHORT) (W1) IGE: <0.1 kU/L
Class: 0
Class: 0
Class: 0
Class: 0
Class: 0
Class: 0
Class: 0
Class: 0
Class: 0
Class: 0
Class: 0
Elm IgE: <0.1 kU/L
IgE (Immunoglobulin E), Serum: 83 kU/L (ref <=114)
Johnson Grass: <0.1 kU/L
Pecan/Hickory Tree IgE: <0.1 kU/L
Sheep Sorrel IgE: <0.1 kU/L

## 2017-11-04 LAB — INTERPRETATION:

## 2017-11-05 ENCOUNTER — Telehealth: Payer: Self-pay | Admitting: Internal Medicine

## 2017-11-05 NOTE — Progress Notes (Signed)
LMTCB

## 2017-11-05 NOTE — Telephone Encounter (Signed)
Left voice mail on machine for patient to return phone call back regarding results. X1  

## 2017-11-06 NOTE — Telephone Encounter (Signed)
Left voice mail on machine for patient to return phone call back regarding results. X2 

## 2017-11-09 DIAGNOSIS — Z0271 Encounter for disability determination: Secondary | ICD-10-CM

## 2017-11-09 NOTE — Telephone Encounter (Signed)
Notes recorded by Tanda Rockers, MD on 11/05/2017 at 8:40 AM EDT Call patient : Studies are c/w mild allergies/ cockroach only > avoidance is only rx  Patient called back for results. Relayed the following information to her, she verbalized understanding. Nothing else needed at time of call

## 2017-11-09 NOTE — Telephone Encounter (Signed)
Attempted to call patient, no answer, message left to call back. Will send letter and close encounter per triage protocol.   Notes recorded by Georjean Mode, CMA on 11/05/2017 at 2:28 PM EDT Left voice mail on machine for patient to return phone call back regarding results. X2 ------  Notes recorded by Rosana Berger, CMA on 11/05/2017 at 9:36 AM EDT LMTCB ------  Notes recorded by Tanda Rockers, MD on 11/05/2017 at 8:40 AM EDT Call patient : Studies are c/w mild allergies/ cockroach only > avoidance is only rx

## 2017-11-16 DIAGNOSIS — E559 Vitamin D deficiency, unspecified: Secondary | ICD-10-CM | POA: Diagnosis not present

## 2017-11-16 DIAGNOSIS — R5383 Other fatigue: Secondary | ICD-10-CM | POA: Diagnosis not present

## 2017-11-16 DIAGNOSIS — R7309 Other abnormal glucose: Secondary | ICD-10-CM | POA: Diagnosis not present

## 2017-11-16 DIAGNOSIS — Z0000 Encounter for general adult medical examination without abnormal findings: Secondary | ICD-10-CM | POA: Diagnosis not present

## 2017-11-16 DIAGNOSIS — E7800 Pure hypercholesterolemia, unspecified: Secondary | ICD-10-CM | POA: Diagnosis not present

## 2017-11-17 ENCOUNTER — Encounter: Payer: Self-pay | Admitting: Internal Medicine

## 2017-11-17 ENCOUNTER — Ambulatory Visit (INDEPENDENT_AMBULATORY_CARE_PROVIDER_SITE_OTHER): Payer: Medicare Other | Admitting: Internal Medicine

## 2017-11-17 VITALS — BP 118/72 | HR 74 | Wt 130.0 lb

## 2017-11-17 DIAGNOSIS — R05 Cough: Secondary | ICD-10-CM | POA: Diagnosis not present

## 2017-11-17 DIAGNOSIS — R058 Other specified cough: Secondary | ICD-10-CM

## 2017-11-17 LAB — NITRIC OXIDE: NITRIC OXIDE: 7

## 2017-11-17 MED ORDER — PREDNISONE 10 MG PO TABS: ORAL_TABLET | ORAL | 0 refills | Status: DC

## 2017-11-17 MED ORDER — ACETAMINOPHEN-CODEINE #3 300-30 MG PO TABS: 1.0000 | ORAL_TABLET | ORAL | 0 refills | Status: AC | PRN

## 2017-11-17 NOTE — Patient Instructions (Addendum)
For drainage / throat tickle try take CHLORPHENIRAMINE  4 mg - take one every 4 hours as needed - available over the counter- may cause drowsiness so start with just a bedtime dose or two (one before bed)  and see how you tolerate it before trying in daytime    If this isn't effective add tylenel #3 up to 2 every 4 hours until no cough x 3 days then try off  And start Prednisone 10 mg take  4 each am x 2 days,   2 each am x 2 days,  1 each am x 2 days and stop    Please schedule a follow up office visit in  2 weeks, sooner if needed  with all medications /inhalers/ solutions in hand so we can verify exactly what you are taking. This includes all medications from all doctors and over the counters

## 2017-11-17 NOTE — Progress Notes (Signed)
Subjective:     Patient ID: Kerry Ortiz, female   DOB: [birth year],     MRN: [id number]   Brief patient profile:  3 yobf never smoker ? Asthma as infant and in HS developed recurrent abd problems dx as ulcers / gerd then around 2005 dx as fibromyalgia/ migraines then around 2015 developed indolent onset daily/noct cough dx as asthma > not much better p started rx with inhalers never returned to ER until 09/23/17 > no better with pred/duler  200 so referred to pulmonary clinic 11/03/2017 by Dr   Gayla Doss    History of Present Illness  11/03/2017 1st [facility] office visit/ Kerry Ortiz   Chief Complaint  Patient presents with  . Pulmonary Consult    Referred by Dr. Ashby Dawes for eval of Asthma.  She is c/o cough x 2 months. She is coughing up some yellow to clear sputum "feels like I'm choking".  She states the cough tends to get worse in the evenings and also when she gets hot.  She uses her albuterol inhaler 3 x daily on average.    Kouffman Reflux v Neurogenic Cough Differentiator Reflux Comments  Do you awaken from a sound sleep coughing violently?                            With trouble breathing? Yes nightly yes   Do you have choking episodes when you cannot  Get enough air, gasping for air ?              Yes   Do you usually cough when you lie down into  The bed, or when you just lie down to rest ?                          After a while = couple of min   Do you usually cough after meals or eating?         Crackers make it happen   Do you cough when (or after) you bend over?    Yes occ    GERD SCORE     Kouffman Reflux v Neurogenic Cough Differentiator Neurogenic   Do you more-or-less cough all day long? sporadic   Does change of temperature make you cough? Hot worse   Does laughing or chuckling cause you to cough? yes   Do fumes (perfume, automobile fumes, burned  Toast, etc.,) cause you to cough ?      Yes    Does speaking, singing, or talking on the phone cause you to cough    ?               Yes    Neurogenic/Airway score       Cough drops make it worse/ sob mostly with coughing / some gen ant chest discomfort during coughing fits  rec Change nexium to  Where you take it Take 30- 60 min before your first and last meals of the day  Take delsym two tsp every 12 hours and supplement if needed with  Tylenol #3  up to 1-2 every 4 hours to suppress the urge to cough. Swallowing water and/or using ice chips/non mint and menthol containing candies (such as lifesavers or sugarless jolly ranchers) are also effective.   Once you have eliminated the cough for 3 straight days try reducing the Tylenol #3  first,  then the delsym as tolerated.   Please schedule  a follow up office visit in 2 weeks, sooner if needed  with all medications /inhalers/ solutions in hand so we can verify exactly what you are taking. This includes all medications from all doctors and over the counters  next:  Consider 1st gen H1 blockers per guidelines / methacholine challenge while on max gerd rx    11/17/2017  f/u ov/Orby Tangen re: cough x 2015, did not follow recs/ no meds or candy handy Chief Complaint  Patient presents with  . Follow-up    2 week f/u for cough, reports still has dry cough, increased SOB with humidity, orthopnea x2 pillows.   Dyspnea:  As long as not coughing >>  breathing ok  Cough: no particular time of day /noct but def with voice use or perfume/ assoc with sense of pnds "constant" with choking sensation  Sleep: better with Tylenol #3 but never used more than 2 in 24 h SABA use: not  needed    No obvious day to day or daytime variability or assoc excess/ purulent sputum or mucus plugs or hemoptysis or cp or chest tightness, subjective wheeze or overt sinus or hb symptoms. No unusual exposure hx or h/o childhood pna/ asthma or knowledge of premature birth.  Sleeping  Fine p tyl#3   without nocturnal  or early am exacerbation  of respiratory  c/o's or need for noct saba. Also denies  any obvious fluctuation of symptoms with weather or environmental changes or other aggravating or alleviating factors except as outlined above   Current Allergies, Complete Past Medical History, Past Surgical History, Family History, and Social History were reviewed in [employer] record.  ROS  The following are not active complaints unless bolded Hoarseness, sore throat, dysphagia, dental problems, itching, sneezing,  nasal congestion or discharge of excess mucus or purulent secretions, ear ache,   fever, chills, sweats, unintended wt loss or wt gain, classically pleuritic or exertional cp,  orthopnea pnd or arm/hand swelling  or leg swelling, presyncope, palpitations, abdominal pain, anorexia, nausea, vomiting, diarrhea  or change in bowel habits or change in bladder habits, change in stools or change in urine, dysuria, hematuria,  rash, arthralgias, visual complaints, headache, numbness, weakness or ataxia or problems with walking or coordination,  change in mood or  memory.        Current Meds  Medication Sig  . albuterol (PROVENTIL HFA;VENTOLIN HFA) 108 (90 BASE) MCG/ACT inhaler Inhale 2 puffs into the lungs every 6 (six) hours as needed for wheezing or shortness of breath.  Marland Kitchen amitriptyline (ELAVIL) 25 MG tablet Take 25 mg by mouth at bedtime.  . citalopram (CELEXA) 10 MG tablet Take 1 tablet (10 mg total) by mouth daily.  Marland Kitchen esomeprazole (NEXIUM) 40 MG capsule Take 30- 60 min before your first and last meals of the day  . fluticasone (FLONASE) 50 MCG/ACT nasal spray Place 2 sprays into both nostrils daily.  . meclizine (ANTIVERT) 25 MG tablet Take 1 tablet (25 mg total) by mouth 3 (three) times daily as needed for dizziness.  . ondansetron (ZOFRAN ODT) 4 MG disintegrating tablet Take 1 tablet (4 mg total) by mouth every 8 (eight) hours as needed. (Patient taking differently: Take 4 mg by mouth every 8 (eight) hours as needed for nausea or vomiting. )  . pregabalin  (LYRICA) 150 MG capsule Take 1 capsule (150 mg total) by mouth 2 (two) times daily.  . pregabalin (LYRICA) 150 MG capsule 1 tablet 2 (two) times daily.  . rizatriptan (MAXALT-MLT)  10 MG disintegrating tablet Take 1 tablet (10 mg total) by mouth as needed. May repeat in 2 hours if needed  . sucralfate (CARAFATE) 1 G tablet Take 1 tablet (1 g total) by mouth 4 (four) times daily. (Patient taking differently: Take 1 g by mouth every morning. )  . tiZANidine (ZANAFLEX) 4 MG tablet Take 4 mg by mouth every 8 (eight) hours as needed for muscle spasms.   Marland Kitchen zonisamide (ZONEGRAN) 100 MG capsule Take 1 capsule (100 mg total) by mouth 2 (two) times daily.  . [DISCONTINUED] Spacer/Aero-Holding Chambers (AEROCHAMBER PLUS FLO-VU LARGE) MISC 1 each by Other route once.                      Objective:   Physical Exam    amb bf nad - did not cough entire ov    11/17/2017      130   11/03/17 129 lb 12.8 oz (58.9 kg)  08/20/17 131 lb (59.4 kg)  06/18/17 130 lb (59 kg)      Vital signs reviewed - Note on arrival 02 sats  98% on RA        HEENT: nl dentition,   and oropharynx which is pristine. Nl external ear canals without cough reflex - mild  bilateral non-specific turbinate edema     NECK :  without JVD/Nodes/TM/ nl carotid upstrokes bilaterally   LUNGS: no acc muscle use,  Nl contour chest which is clear to A and P bilaterally without cough on insp or exp maneuvers   CV:  RRR  no s3 or murmur or increase in P2, and no edema   ABD:  soft and nontender with nl inspiratory excursion in the supine position. No bruits or organomegaly appreciated, bowel sounds nl  MS:  Nl gait/ ext warm without deformities, calf tenderness, cyanosis or clubbing No obvious joint restrictions   SKIN: warm and dry without lesions    NEURO:  alert, approp, nl sensorium with  no motor or cerebellar deficits apparent.             Assessment:

## 2017-11-18 ENCOUNTER — Encounter: Payer: Self-pay | Admitting: Internal Medicine

## 2017-11-18 NOTE — Assessment & Plan Note (Addendum)
Onset around 2015 -  MRI sinuse  05/07/17 Sinuses/Orbits: Globes and orbital soft tissues normal. Paranasal sinuses clear. No mastoid effusion. Inner ear structures normal. - FENO 11/03/2017  =   5 on dulera 200 2bid with no improvement in cough > d/c'd 11/03/2017  -  Allergy profile 11/03/2017 >  Eos 0.0 /  IgE  83 RAST pos cockroach only  - cyclical cough regimen 10/04/8248 > not adherent  - FENO 11/17/2017  =    7 on no rx    Lack of cough resolution on a verified empirical regimen (which we have failed to accomplish so far) could mean an alternative diagnosis (cough variant asthma, less likely with such low feno but no excluded entirely) , persistence of the disease state (eg sinusitis or bronchiectasis) , or inadequacy of currently available therapy (eg no medical rx available for non-acid gerd)   For now rec trial of 1st gen H1 blockers per guidelines  And if not effective repeat the cyclical cough regiment prior to consideration for refer to voice lab at [facility] for persistent  "choking" sensation  First needs to return with all meds in hand using a trust but verify approach to confirm accurate Medication  Reconciliation The principal here is that until we are certain that the  patients are doing what we've asked, it makes no sense to ask them to do more.    I had an extended discussion with the patient reviewing all relevant studies completed to date and  lasting 15 to 20 minutes of a 25 minute visit    Each maintenance medication was reviewed in detail including most importantly the difference between maintenance and prns and under what circumstances the prns are to be triggered using an action plan format that is not reflected in the computer generated alphabetically organized AVS.    Please see AVS for specific instructions unique to this visit that I personally wrote and verbalized to the the pt in detail and then reviewed with pt  by my nurse highlighting any  changes in therapy recommended at  today's visit to their plan of care.

## 2017-11-19 DIAGNOSIS — K644 Residual hemorrhoidal skin tags: Secondary | ICD-10-CM | POA: Diagnosis not present

## 2017-11-19 DIAGNOSIS — N390 Urinary tract infection, site not specified: Secondary | ICD-10-CM | POA: Diagnosis not present

## 2017-11-19 DIAGNOSIS — K219 Gastro-esophageal reflux disease without esophagitis: Secondary | ICD-10-CM | POA: Diagnosis not present

## 2017-11-24 DIAGNOSIS — D509 Iron deficiency anemia, unspecified: Secondary | ICD-10-CM | POA: Diagnosis not present

## 2017-11-24 DIAGNOSIS — K219 Gastro-esophageal reflux disease without esophagitis: Secondary | ICD-10-CM | POA: Diagnosis not present

## 2017-11-24 DIAGNOSIS — R5383 Other fatigue: Secondary | ICD-10-CM | POA: Diagnosis not present

## 2017-11-24 DIAGNOSIS — M797 Fibromyalgia: Secondary | ICD-10-CM | POA: Diagnosis not present

## 2017-11-24 DIAGNOSIS — Z0000 Encounter for general adult medical examination without abnormal findings: Secondary | ICD-10-CM | POA: Diagnosis not present

## 2017-11-24 DIAGNOSIS — R7309 Other abnormal glucose: Secondary | ICD-10-CM | POA: Diagnosis not present

## 2017-11-25 DIAGNOSIS — M797 Fibromyalgia: Secondary | ICD-10-CM | POA: Diagnosis not present

## 2017-12-04 ENCOUNTER — Ambulatory Visit: Payer: Medicare Other | Admitting: Internal Medicine

## 2017-12-04 ENCOUNTER — Encounter: Payer: Self-pay | Admitting: Internal Medicine

## 2017-12-04 ENCOUNTER — Ambulatory Visit (INDEPENDENT_AMBULATORY_CARE_PROVIDER_SITE_OTHER): Payer: Medicare Other | Admitting: Internal Medicine

## 2017-12-04 VITALS — BP 106/68 | HR 80 | Ht 62.0 in | Wt 132.8 lb

## 2017-12-04 DIAGNOSIS — R05 Cough: Secondary | ICD-10-CM | POA: Diagnosis not present

## 2017-12-04 DIAGNOSIS — R058 Other specified cough: Secondary | ICD-10-CM

## 2017-12-04 NOTE — Progress Notes (Signed)
Subjective:     Patient ID: Kerry [facility], female   DOB: 1966-02-08,     MRN: [id number]   Brief patient profile:  35 yobf never smoker ? Asthma as infant and in HS developed recurrent abd problems dx as ulcers / gerd then around 2005 dx as fibromyalgia/ migraines then around 2015 developed indolent onset daily/noct cough dx as asthma > not much better p started rx with inhalers never returned to ER until 09/23/17 > no better with pred/duler  200 so referred to pulmonary clinic 11/03/2017 by Dr   Gayla Doss    History of Present Illness  11/03/2017 1st [facility] office visit/ Damon Baisch   Chief Complaint  Patient presents with  . Pulmonary Consult    Referred by Dr. Ashby Dawes for eval of Asthma.  She is c/o cough x 2 months. She is coughing up some yellow to clear sputum "feels like I'm choking".  She states the cough tends to get worse in the evenings and also when she gets hot.  She uses her albuterol inhaler 3 x daily on average.    Kouffman Reflux v Neurogenic Cough Differentiator Reflux Comments  Do you awaken from a sound sleep coughing violently?                            With trouble breathing? Yes nightly yes   Do you have choking episodes when you cannot  Get enough air, gasping for air ?              Yes   Do you usually cough when you lie down into  The bed, or when you just lie down to rest ?                          After a while = couple of min   Do you usually cough after meals or eating?         Crackers make it happen   Do you cough when (or after) you bend over?    Yes occ    GERD SCORE     Kouffman Reflux v Neurogenic Cough Differentiator Neurogenic   Do you more-or-less cough all day long? sporadic   Does change of temperature make you cough? Hot worse   Does laughing or chuckling cause you to cough? yes   Do fumes (perfume, automobile fumes, burned  Toast, etc.,) cause you to cough ?      Yes    Does speaking, singing, or talking on the phone cause you to cough    ?               Yes    Neurogenic/Airway score       Cough drops make it worse/ sob mostly with coughing / some gen ant chest discomfort during coughing fits  rec Change nexium to  Where you take it Take 30- 60 min before your first and last meals of the day  Take delsym two tsp every 12 hours and supplement if needed with  Tylenol #3  up to 1-2 every 4 hours to suppress the urge to cough. Swallowing water and/or using ice chips/non mint and menthol containing candies (such as lifesavers or sugarless jolly ranchers) are also effective.   Once you have eliminated the cough for 3 straight days try reducing the Tylenol #3  first,  then the delsym as tolerated.   Please schedule  a follow up office visit in 2 weeks, sooner if needed  with all medications /inhalers/ solutions in hand so we can verify exactly what you are taking. This includes all medications from all doctors and over the counters  next:  Consider 1st gen H1 blockers per guidelines / methacholine challenge while on max gerd rx      11/17/2017  f/u ov/Shylo Dillenbeck re: cough x 2015, did not follow recs/ no meds or candy handy Chief Complaint  Patient presents with  . Follow-up    2 week f/u for cough, reports still has dry cough, increased SOB with humidity, orthopnea x2 pillows.   Dyspnea:  As long as not coughing >>  breathing ok  Cough: no particular time of day /noct but def with voice use or perfume/ assoc with sense of pnds "constant" with choking sensation  Sleep: better with Tylenol #3 but never used more than 2 in 24 h SABA use: not  Needed rec For drainage / throat tickle try take CHLORPHENIRAMINE  4 mg - take one every 4 hours as needed   If this isn't effective add tylenel #3 up to 2 every 4 hours until no cough x 3 days then try off  And start Prednisone 10 mg take  4 each am x 2 days,   2 each am x 2 days,  1 each am x 2 days and stop       12/04/2017  f/u ov/Michale Emmerich re: cough x 2015 / did not use h1 as rec  - did bring all  meds as req  Chief Complaint  Patient presents with  . Follow-up    Cough has improved some, but she is still having CP and SOB the same since the last visit. She has not had to use her albuterol inhaler but has been using an empty Dulera inhaler 2-3 x per day.   Dyspnea:  Not unless coughing and no worse off dulera Cough: some better overall but s change in pattern   Sleep: better  SABA use:  None    No obvious day to day or daytime variability or assoc excess/ purulent sputum or mucus plugs or hemoptysis or cp or chest tightness, subjective wheeze or overt sinus or hb symptoms. No unusual exposure hx or h/o childhood pna/ asthma or knowledge of premature birth.  Sleeping  ok  without nocturnal  or early am exacerbation  of respiratory  c/o's or need for noct saba. Also denies any obvious fluctuation of symptoms with weather or environmental changes or other aggravating or alleviating factors except as outlined above   Current Allergies, Complete Past Medical History, Past Surgical History, Family History, and Social History were reviewed in [employer] record.  ROS  The following are not active complaints unless bolded Hoarseness, sore throat, dysphagia, dental problems, itching, sneezing,  nasal congestion or discharge of excess mucus or purulent secretions, ear ache,   fever, chills, sweats, unintended wt loss or wt gain, classically pleuritic or exertional cp,  orthopnea pnd or arm/hand swelling  or leg swelling, presyncope, palpitations, abdominal pain, anorexia, nausea, vomiting, diarrhea  or change in bowel habits or change in bladder habits, change in stools or change in urine, dysuria, hematuria,  rash, arthralgias, visual complaints, headache, numbness, weakness or ataxia or problems with walking or coordination,  change in mood or  memory.        Current Meds  Medication Sig  . acetaminophen-codeine (TYLENOL #3) 300-30 MG tablet Take 1-2 tablets  by mouth  every 4 (four) hours as needed for moderate pain.  Marland Kitchen albuterol (PROVENTIL HFA;VENTOLIN HFA) 108 (90 BASE) MCG/ACT inhaler Inhale 2 puffs into the lungs every 6 (six) hours as needed for wheezing or shortness of breath.  Marland Kitchen amitriptyline (ELAVIL) 25 MG tablet Take 25 mg by mouth at bedtime.  . cetirizine (ZYRTEC) 10 MG tablet Take 10 mg by mouth daily.  . citalopram (CELEXA) 10 MG tablet Take 1 tablet (10 mg total) by mouth daily.  . diclofenac (VOLTAREN) 75 MG EC tablet Take 75 mg by mouth 2 (two) times daily.  Marland Kitchen esomeprazole (NEXIUM) 40 MG capsule Take 30- 60 min before your first and last meals of the day  . fluticasone (FLONASE) 50 MCG/ACT nasal spray Place 2 sprays into both nostrils daily.  . meclizine (ANTIVERT) 25 MG tablet Take 1 tablet (25 mg total) by mouth 3 (three) times daily as needed for dizziness.  . ondansetron (ZOFRAN ODT) 4 MG disintegrating tablet Take 1 tablet (4 mg total) by mouth every 8 (eight) hours as needed. (Patient taking differently: Take 4 mg by mouth every 8 (eight) hours as needed for nausea or vomiting. )  . pregabalin (LYRICA) 150 MG capsule Take 1 capsule (150 mg total) by mouth 2 (two) times daily.  . pregabalin (LYRICA) 150 MG capsule 1 tablet 2 (two) times daily.  . rizatriptan (MAXALT-MLT) 10 MG disintegrating tablet Take 1 tablet (10 mg total) by mouth as needed. May repeat in 2 hours if needed  . sucralfate (CARAFATE) 1 G tablet Take 1 tablet (1 g total) by mouth 4 (four) times daily. (Patient taking differently: Take 1 g by mouth every morning. )  . tiZANidine (ZANAFLEX) 4 MG tablet Take 4 mg by mouth every 8 (eight) hours as needed for muscle spasms.   Marland Kitchen zonisamide (ZONEGRAN) 100 MG capsule Take 1 capsule (100 mg total) by mouth 2 (two) times daily.  . [DISCONTINUED] mometasone-formoterol (DULERA) 200-5 MCG/ACT AERO Inhale 2 puffs into the lungs 2 (two) times daily.               Objective:   Physical Exam  amb bf nad    12/04/2017         132    11/17/2017      130   11/03/17 129 lb 12.8 oz (58.9 kg)  08/20/17 131 lb (59.4 kg)  06/18/17 130 lb (59 kg)    Vital signs reviewed - Note on arrival 02 sats  100% on RA        HEENT: nl dentition,   and oropharynx which remains pristine. Nl external ear canals without cough reflex- mild bilateral non-specific turbinate edema     NECK :  without JVD/Nodes/TM/ nl carotid upstrokes bilaterally   LUNGS: no acc muscle use,  Nl contour chest which is clear to A and P bilaterally without cough on insp or exp maneuvers   CV:  RRR  no s3 or murmur or increase in P2, and no edema   ABD:  soft and nontender with nl inspiratory excursion in the supine position. No bruits or organomegaly appreciated, bowel sounds nl  MS:  Nl gait/ ext warm without deformities, calf tenderness, cyanosis or clubbing No obvious joint restrictions   SKIN: warm and dry without lesions    NEURO:  alert, approp, nl sensorium with  no motor or cerebellar deficits apparent.                  Assessment:

## 2017-12-04 NOTE — Patient Instructions (Addendum)
For drainage / throat tickle try take CHLORPHENIRAMINE  4 mg - take one every 4 hours as needed - available over the counter- may cause drowsiness so start with just a bedtime dose or two and see how you tolerate it before trying in daytime       Please see patient coordinator before you leave today  to schedule methacholine challenge test and if neg I will be referring you to Dr Bettina Gavia at [facility] or you can return to your ENT doctor if your chose

## 2017-12-06 ENCOUNTER — Encounter: Payer: Self-pay | Admitting: Internal Medicine

## 2017-12-06 NOTE — Assessment & Plan Note (Addendum)
Onset around 2015 -  MRI sinus  05/07/17 Sinuses/Orbits:  . Paranasal sinuses clear. No mastoid effusion. Inner ear structures normal. - FENO 11/03/2017  =   5 on dulera 200 2bid with no improvement in cough > d/c'd 11/03/2017  -  Allergy profile 11/03/2017 >  Eos 0.0 /  IgE  83 RAST pos cockroach only  - cyclical cough regimen 09/05/9796 > not adherent - FENO 11/17/2017  =    7 on no rx     - 12/04/2017 did not try 1st gen H1 > try chlorpheniramine and scheduled MCT  Based on chronicity and pattern this is refractory Upper airway cough syndrome (previously labeled PNDS),  is so named because it's frequently impossible to sort out how much is  CR/sinusitis with freq throat clearing (which can be related to primary GERD)   vs  causing  secondary (" extra esophageal")  GERD from wide swings in gastric pressure that occur with throat clearing, often  promoting self use of mint and menthol lozenges that reduce the lower esophageal sphincter tone and exacerbate the problem further in a cyclical fashion.   These are the same pts (now being labeled as having "irritable larynx syndrome" by some cough centers) who not infrequently have a history of having failed to tolerate ace inhibitors,  dry powder inhalers or biphosphonates or report having atypical/extraesophageal reflux symptoms that don't respond to standard doses of PPI  and are easily confused as having aecopd or asthma flares by even experienced allergists/ pulmonologists (myself included).   rec add 1st gen H1 blockers per guidelines  And proceed with MCT and if neg then refer to  [place]    I had an extended final summary discussion with the patient reviewing all relevant studies completed to date and  lasting 15 to 20 minutes of a 25 minute visit on the following issues:   This does not appear to be a pulmonary problem but MCT needs to be done to complete the w/u  In the meantime should stay on same meds.  Each maintenance medication was reviewed in  detail including most importantly the difference between maintenance and as needed and under what circumstances the prns are to be used.  Please see AVS for specific  Instructions which are unique to this visit and I personally typed out  which were reviewed in detail in writing with the patient and a copy provided.

## 2017-12-15 ENCOUNTER — Ambulatory Visit (HOSPITAL_COMMUNITY)
Admission: RE | Admit: 2017-12-15 | Discharge: 2017-12-15 | Disposition: A | Discharge status: Home / Self Care | Payer: Medicare Other | Source: Ambulatory Visit | Attending: Internal Medicine | Admitting: Internal Medicine

## 2017-12-15 DIAGNOSIS — J449 Chronic obstructive pulmonary disease, unspecified: Secondary | ICD-10-CM | POA: Insufficient documentation

## 2017-12-15 DIAGNOSIS — R05 Cough: Secondary | ICD-10-CM | POA: Insufficient documentation

## 2017-12-15 DIAGNOSIS — R058 Other specified cough: Secondary | ICD-10-CM

## 2017-12-15 LAB — PULMONARY FUNCTION TEST
FEF 25-75 PRE: 1.85 L/s
FEF 25-75 Post: 2.22 L/sec
FEF2575-%CHANGE-POST: 20 %
FEF2575-%PRED-POST: 99 %
FEF2575-%Pred-Pre: 83 %
FEV1-%CHANGE-POST: 5 %
FEV1-%PRED-POST: 98 %
FEV1-%Pred-Pre: 93 %
FEV1-PRE: 1.96 L
FEV1-Post: 2.07 L
FEV1FVC-%Change-Post: 3 %
FEV1FVC-%Pred-Pre: 98 %
FEV6-%Change-Post: 1 %
FEV6-%Pred-Post: 98 %
FEV6-%Pred-Pre: 97 %
FEV6-PRE: 2.46 L
FEV6-Post: 2.51 L
FEV6FVC-%PRED-PRE: 103 %
FEV6FVC-%Pred-Post: 103 %
FVC-%CHANGE-POST: 1 %
FVC-%PRED-POST: 95 %
FVC-%PRED-PRE: 93 %
FVC-POST: 2.51 L
FVC-PRE: 2.46 L
POST FEV1/FVC RATIO: 82 %
PRE FEV6/FVC RATIO: 100 %
Post FEV6/FVC ratio: 100 %
Pre FEV1/FVC ratio: 80 %

## 2017-12-15 MED ORDER — METHACHOLINE 1 MG/ML NEB SOLN
2.0000 mL | Freq: Once | RESPIRATORY_TRACT | Status: AC
  Administered 2017-12-15: 2 mg via RESPIRATORY_TRACT

## 2017-12-15 MED ORDER — METHACHOLINE 0.0625 MG/ML NEB SOLN
2.0000 mL | Freq: Once | RESPIRATORY_TRACT | Status: AC
  Administered 2017-12-15: 0.125 mg via RESPIRATORY_TRACT

## 2017-12-15 MED ORDER — METHACHOLINE 16 MG/ML NEB SOLN
2.0000 mL | Freq: Once | RESPIRATORY_TRACT | Status: AC
  Administered 2017-12-15: 32 mg via RESPIRATORY_TRACT

## 2017-12-15 MED ORDER — METHACHOLINE 0.25 MG/ML NEB SOLN
2.0000 mL | Freq: Once | RESPIRATORY_TRACT | Status: AC
  Administered 2017-12-15: 0.5 mg via RESPIRATORY_TRACT

## 2017-12-15 MED ORDER — ALBUTEROL SULFATE (2.5 MG/3ML) 0.083% IN NEBU
2.5000 mg | INHALATION_SOLUTION | Freq: Once | RESPIRATORY_TRACT | Status: AC
  Administered 2017-12-15: 2.5 mg via RESPIRATORY_TRACT

## 2017-12-15 MED ORDER — METHACHOLINE 4 MG/ML NEB SOLN
2.0000 mL | Freq: Once | RESPIRATORY_TRACT | Status: AC
  Administered 2017-12-15: 8 mg via RESPIRATORY_TRACT

## 2017-12-15 MED ORDER — SODIUM CHLORIDE 0.9 % IN NEBU
3.0000 mL | INHALATION_SOLUTION | Freq: Once | RESPIRATORY_TRACT | Status: AC
  Administered 2017-12-15: 3 mL via RESPIRATORY_TRACT

## 2017-12-16 ENCOUNTER — Other Ambulatory Visit: Payer: Self-pay | Admitting: Internal Medicine

## 2017-12-16 DIAGNOSIS — R058 Other specified cough: Secondary | ICD-10-CM

## 2017-12-16 DIAGNOSIS — R05 Cough: Secondary | ICD-10-CM

## 2018-01-28 DIAGNOSIS — K219 Gastro-esophageal reflux disease without esophagitis: Secondary | ICD-10-CM | POA: Diagnosis not present

## 2018-01-28 DIAGNOSIS — M797 Fibromyalgia: Secondary | ICD-10-CM | POA: Diagnosis not present

## 2018-01-28 DIAGNOSIS — E7800 Pure hypercholesterolemia, unspecified: Secondary | ICD-10-CM | POA: Diagnosis not present

## 2018-01-28 DIAGNOSIS — R7309 Other abnormal glucose: Secondary | ICD-10-CM | POA: Diagnosis not present

## 2018-02-03 DIAGNOSIS — M791 Myalgia, unspecified site: Secondary | ICD-10-CM | POA: Diagnosis not present

## 2018-02-03 DIAGNOSIS — R5383 Other fatigue: Secondary | ICD-10-CM | POA: Diagnosis not present

## 2018-02-03 DIAGNOSIS — M549 Dorsalgia, unspecified: Secondary | ICD-10-CM | POA: Diagnosis not present

## 2018-02-03 DIAGNOSIS — M79641 Pain in right hand: Secondary | ICD-10-CM | POA: Diagnosis not present

## 2018-02-03 DIAGNOSIS — M25775 Osteophyte, left foot: Secondary | ICD-10-CM | POA: Diagnosis not present

## 2018-02-03 DIAGNOSIS — M79673 Pain in unspecified foot: Secondary | ICD-10-CM | POA: Diagnosis not present

## 2018-02-03 DIAGNOSIS — M79642 Pain in left hand: Secondary | ICD-10-CM | POA: Diagnosis not present

## 2018-02-03 DIAGNOSIS — M25774 Osteophyte, right foot: Secondary | ICD-10-CM | POA: Diagnosis not present

## 2018-02-03 DIAGNOSIS — M797 Fibromyalgia: Secondary | ICD-10-CM | POA: Diagnosis not present

## 2018-02-03 DIAGNOSIS — M2550 Pain in unspecified joint: Secondary | ICD-10-CM | POA: Diagnosis not present

## 2018-02-03 DIAGNOSIS — M1812 Unilateral primary osteoarthritis of first carpometacarpal joint, left hand: Secondary | ICD-10-CM | POA: Diagnosis not present

## 2018-02-03 DIAGNOSIS — M1811 Unilateral primary osteoarthritis of first carpometacarpal joint, right hand: Secondary | ICD-10-CM | POA: Diagnosis not present

## 2018-02-09 DIAGNOSIS — H8110 Benign paroxysmal vertigo, unspecified ear: Secondary | ICD-10-CM | POA: Diagnosis not present

## 2018-02-09 DIAGNOSIS — M2550 Pain in unspecified joint: Secondary | ICD-10-CM | POA: Diagnosis not present

## 2018-02-09 DIAGNOSIS — D8989 Other specified disorders involving the immune mechanism, not elsewhere classified: Secondary | ICD-10-CM | POA: Diagnosis not present

## 2018-02-17 DIAGNOSIS — M545 Low back pain: Secondary | ICD-10-CM | POA: Diagnosis not present

## 2018-02-17 DIAGNOSIS — N390 Urinary tract infection, site not specified: Secondary | ICD-10-CM | POA: Diagnosis not present

## 2018-02-23 ENCOUNTER — Other Ambulatory Visit: Payer: Self-pay | Admitting: Internal Medicine

## 2018-02-23 DIAGNOSIS — R058 Other specified cough: Secondary | ICD-10-CM

## 2018-02-23 DIAGNOSIS — R05 Cough: Secondary | ICD-10-CM

## 2018-02-24 DIAGNOSIS — R05 Cough: Secondary | ICD-10-CM | POA: Diagnosis not present

## 2018-02-25 DIAGNOSIS — M797 Fibromyalgia: Secondary | ICD-10-CM | POA: Diagnosis not present

## 2018-02-25 DIAGNOSIS — M1990 Unspecified osteoarthritis, unspecified site: Secondary | ICD-10-CM | POA: Diagnosis not present

## 2018-02-25 DIAGNOSIS — R5383 Other fatigue: Secondary | ICD-10-CM | POA: Diagnosis not present

## 2018-02-25 DIAGNOSIS — M2550 Pain in unspecified joint: Secondary | ICD-10-CM | POA: Diagnosis not present

## 2018-02-25 DIAGNOSIS — M79643 Pain in unspecified hand: Secondary | ICD-10-CM | POA: Diagnosis not present

## 2018-02-25 DIAGNOSIS — R053 Chronic cough: Secondary | ICD-10-CM | POA: Insufficient documentation

## 2018-03-05 DIAGNOSIS — Z1231 Encounter for screening mammogram for malignant neoplasm of breast: Secondary | ICD-10-CM | POA: Diagnosis not present

## 2018-03-12 DIAGNOSIS — E7800 Pure hypercholesterolemia, unspecified: Secondary | ICD-10-CM | POA: Diagnosis not present

## 2018-03-12 DIAGNOSIS — R7309 Other abnormal glucose: Secondary | ICD-10-CM | POA: Diagnosis not present

## 2018-03-16 ENCOUNTER — Other Ambulatory Visit: Payer: Self-pay

## 2018-03-16 ENCOUNTER — Encounter (HOSPITAL_COMMUNITY): Payer: Self-pay | Admitting: Emergency Medicine

## 2018-03-16 ENCOUNTER — Emergency Department (HOSPITAL_COMMUNITY)
Admission: EM | Admit: 2018-03-16 | Discharge: 2018-03-17 | Disposition: A | Discharge status: Home / Self Care | Payer: Medicare Other | Attending: Emergency Medicine | Admitting: Emergency Medicine

## 2018-03-16 ENCOUNTER — Emergency Department (HOSPITAL_COMMUNITY): Payer: Medicare Other

## 2018-03-16 DIAGNOSIS — Z79899 Other long term (current) drug therapy: Secondary | ICD-10-CM | POA: Insufficient documentation

## 2018-03-16 DIAGNOSIS — R0789 Other chest pain: Secondary | ICD-10-CM | POA: Diagnosis not present

## 2018-03-16 DIAGNOSIS — J45909 Unspecified asthma, uncomplicated: Secondary | ICD-10-CM | POA: Diagnosis not present

## 2018-03-16 DIAGNOSIS — R079 Chest pain, unspecified: Secondary | ICD-10-CM | POA: Diagnosis not present

## 2018-03-16 LAB — BASIC METABOLIC PANEL
Anion gap: 10 (ref 5–15)
BUN: 12 mg/dL (ref 6–20)
CHLORIDE: 108 mmol/L (ref 98–111)
CO2: 21 mmol/L — AB (ref 22–32)
Calcium: 9.3 mg/dL (ref 8.9–10.3)
Creatinine, Ser: 0.82 mg/dL (ref 0.44–1.00)
GFR calc Af Amer: >60 mL/min (ref >60)
GFR calc non Af Amer: >60 mL/min (ref >60)
GLUCOSE: 94 mg/dL (ref 70–99)
POTASSIUM: 4.1 mmol/L (ref 3.5–5.1)
SODIUM: 139 mmol/L (ref 135–145)

## 2018-03-16 LAB — CBC
HEMATOCRIT: 41.3 % (ref 36.0–46.0)
Hemoglobin: 12.6 g/dL (ref 12.0–15.0)
MCH: 24.2 pg — ABNORMAL LOW (ref 26.0–34.0)
MCHC: 30.5 g/dL (ref 30.0–36.0)
MCV: 79.4 fL (ref 78.0–100.0)
Platelets: 361 10*3/uL (ref 150–400)
RBC: 5.2 MIL/uL — ABNORMAL HIGH (ref 3.87–5.11)
RDW: 15.8 % — ABNORMAL HIGH (ref 11.5–15.5)
WBC: 7.2 10*3/uL (ref 4.0–10.5)

## 2018-03-16 LAB — I-STAT BETA HCG BLOOD, ED (MC, WL, AP ONLY): I-stat hCG, quantitative: <5 m[IU]/mL (ref <5)

## 2018-03-16 LAB — I-STAT TROPONIN, ED: Troponin i, poc: 0 ng/mL (ref 0.00–0.08)

## 2018-03-16 MED ORDER — GI COCKTAIL ~~LOC~~
30.0000 mL | Freq: Once | ORAL | Status: AC
  Administered 2018-03-17: 30 mL via ORAL
  Filled 2018-03-16: qty 30

## 2018-03-16 MED ORDER — KETOROLAC TROMETHAMINE 15 MG/ML IJ SOLN
15.0000 mg | Freq: Once | INTRAMUSCULAR | Status: AC
  Administered 2018-03-17: 15 mg via INTRAVENOUS
  Filled 2018-03-16: qty 1

## 2018-03-16 NOTE — ED Notes (Signed)
PA at bedside.

## 2018-03-16 NOTE — ED Triage Notes (Signed)
Pt reports CP onset 1hr ago. R sided with radiation down R arm, 10/10, sharp in nature. Denies SOB. No cardiac hx.

## 2018-03-16 NOTE — ED Provider Notes (Signed)
Patient placed in Quick Look pathway, seen and evaluated   Chief Complaint: right sided chest pain  HPI:   Pt complains of pain on the right side of her chest   ROS: no shortness of breath no fever  Physical Exam:   Gen: No distress  Neuro: Awake and Alert  Skin: Warm    Focused Exam: Lungs clear, Heart rrr   Initiation of care has begun. The patient has been counseled on the process, plan, and necessity for staying for the completion/evaluation, and the remainder of the medical screening examination   Sidney Ace 03/16/18 1931    Elnora Morrison, MD 03/21/18 [phone]

## 2018-03-17 DIAGNOSIS — R0789 Other chest pain: Secondary | ICD-10-CM | POA: Diagnosis not present

## 2018-03-17 LAB — I-STAT TROPONIN, ED: TROPONIN I, POC: 0 ng/mL (ref 0.00–0.08)

## 2018-03-17 LAB — D-DIMER, QUANTITATIVE: D-Dimer, Quant: 0.5 ug/mL-FEU (ref 0.00–0.50)

## 2018-03-17 MED ORDER — METHOCARBAMOL 500 MG PO TABS: 500.0000 mg | ORAL_TABLET | Freq: Two times a day (BID) | ORAL | 0 refills | Status: DC

## 2018-03-17 MED ORDER — LIDOCAINE 5 % EX PTCH: 1.0000 | MEDICATED_PATCH | CUTANEOUS | 0 refills | Status: DC

## 2018-03-17 NOTE — Discharge Instructions (Addendum)
Your work-up has been very reassuring in the ED.  Unknown cause your symptoms.  May related to musculoskeletal pain.  Have given you a muscle relaxer to take do not drive with this medication.  Have also given you lidocaine patch to applied to the right upper chest to see if this helps with pain as well.  Would continue taking anti-inflammatories for the pain.  Follow-up with primary care doctor for further work-up with cardiologist and stress test if chest pain persist.  Return to ED if you develop any shortness of breath, worsening chest pains or for any other reason.

## 2018-03-17 NOTE — ED Provider Notes (Signed)
[facility] Provider Note   CSN: [id number] Arrival date & time: 03/16/18  1900     History   Chief Complaint Chief Complaint  Patient presents with  . Chest Pain    HPI Kerry [facility]iz is a 52 y.o. female.  HPI 52 year old African-American female past medical history significant for fibromyalgia, acid reflux, asthma, degenerative disc disease presents to the emergency department today for evaluation of right-sided chest pain.  Patient states that she was seen by her dentist earlier this morning for a cracked tooth.  States that after she left and went to the grocery store she started to develop some right-sided chest pain.  States that she did have this pain several days ago that self resolved.  States the pain is located the right upper chest and is sharp in nature.  Does not radiate.  Reports some shortness of breath and pleuritic chest pain with it.  Denies any exertional chest pain.  Patient denies any diaphoresis, nausea or emesis.  She has not taken anything for the pain.  Nothing makes better or worse.  Denies any history of cardiac disease.  Reports mild family history of cardiac disease.  Denies any history of DVT/PE, prolonged immobilization, recent hospitalization/surgeries, unilateral leg swelling or calf tenderness, tobacco use, hormone therapy.  Denies any injury to the chest or shoulder.  Patient denies any associated fevers or chills.  She has not taken anything today for her pain however she reports dental pain as well.  She is being referred to an endodontist for tooth repair.  Denies taking any antibiotics.  Pt denies any fever, chill, ha, vision changes, lightheadedness, dizziness, congestion, neck pain,cough, abd pain, n/v/d, urinary symptoms, change in bowel habits, melena, hematochezia, lower extremity paresthesias.  Past Medical History:  Diagnosis Date  . Acid reflux   . Asthma   . Disturbance of skin sensation   .  Fibromyalgia   . Migraine   . Uterine fibroid     Patient Active Problem List   Diagnosis Date Noted  . Upper airway cough syndrome 11/03/2017  . Leg weakness 05/07/2017  . Hypokalemia 05/07/2017  . Anemia 05/07/2017  . Vertigo 05/07/2017  . Chronic migraine 04/23/2017  . Paresthesia 02/07/2016  . ABDOMINAL BLOATING 11/08/2008  . EPIGASTRIC PAIN 11/08/2008  . DEPRESSION 10/31/2008  . OSTEOARTHRITIS 10/31/2008  . FIBROMYALGIA 10/31/2008  . HEADACHE, CHRONIC 10/31/2008  . ABDOMINAL PAIN, RECURRENT 10/31/2008  . CARPAL TUNNEL SYNDROME, HX OF 10/31/2008    Past Surgical History:  Procedure Laterality Date  . CARPAL TUNNEL RELEASE    . CYSTECTOMY    . HAND SURGERY Right   . SHOULDER SURGERY Right      OB History   None      Home Medications    Prior to Admission medications   Medication Sig Start Date End Date Taking? Authorizing Provider  albuterol (PROVENTIL HFA;VENTOLIN HFA) 108 (90 BASE) MCG/ACT inhaler Inhale 2 puffs into the lungs every 6 (six) hours as needed for wheezing or shortness of breath. 10/24/14  Yes Gregor Hams, MD  amitriptyline (ELAVIL) 25 MG tablet Take 25 mg by mouth at bedtime. 04/23/15  Yes [provider]  cetirizine (ZYRTEC) 10 MG tablet Take 10 mg by mouth daily.   Yes [provider]  citalopram (CELEXA) 10 MG tablet Take 1 tablet (10 mg total) by mouth daily. 05/03/15  Yes Marcial Pacas, MD  esomeprazole (NEXIUM) 40 MG capsule TAKE 1 CAPSULE BY MOUTH 30-60 MINUTES  BEFORE THE FIRST AND LAST MEAL OF THE DAY Patient taking differently: Take 40 mg by mouth 2 (two) times daily before a meal.  02/23/18  Yes Tanda Rockers, MD  fluticasone (FLONASE) 50 MCG/ACT nasal spray Place 2 sprays into both nostrils daily. 09/23/17  Yes Yu, Amy V, PA-C  pregabalin (LYRICA) 200 MG capsule Take 200 mg by mouth 2 (two) times daily.   Yes [provider]  rizatriptan (MAXALT-MLT) 10 MG disintegrating tablet Take 1 tablet (10 mg total) by mouth  as needed. May repeat in 2 hours if needed 08/20/17  Yes Marcial Pacas, MD  sucralfate (CARAFATE) 1 G tablet Take 1 tablet (1 g total) by mouth 4 (four) times daily. Patient taking differently: Take 1 g by mouth daily.  07/11/12  Yes Malvin Johns, MD  zonisamide (ZONEGRAN) 100 MG capsule Take 1 capsule (100 mg total) by mouth 2 (two) times daily. 08/20/17  Yes Marcial Pacas, MD  meclizine (ANTIVERT) 25 MG tablet Take 1 tablet (25 mg total) by mouth 3 (three) times daily as needed for dizziness. Patient not taking: Reported on 03/17/2018 05/09/17   Cristal Ford, DO  ondansetron (ZOFRAN ODT) 4 MG disintegrating tablet Take 1 tablet (4 mg total) by mouth every 8 (eight) hours as needed. Patient not taking: Reported on 03/17/2018 04/23/17   Marcial Pacas, MD  pregabalin (LYRICA) 150 MG capsule Take 1 capsule (150 mg total) by mouth 2 (two) times daily. Patient not taking: Reported on 03/17/2018 08/20/17   Marcial Pacas, MD    Family History Family History  Problem Relation Age of Onset  . Hypertension Mother   . Arrhythmia Mother   . Thyroid disease Mother   . Pneumonia Father   . Diabetes Maternal Grandmother   . Kidney disease Sister   . Multiple sclerosis Cousin     Social History Social History   Tobacco Use  . Smoking status: Never Smoker  . Smokeless tobacco: Never Used  Substance Use Topics  . Alcohol use: No  . Drug use: No     Allergies   Patient has no known allergies.   Review of Systems Review of Systems  All other systems reviewed and are negative.    Physical Exam Updated Vital Signs BP (!) 144/75   Pulse 73   Temp 98.8 F (37.1 C) (Oral)   Resp 17   Ht 5\' 2"  (1.575 m)   Wt 61.2 kg   SpO2 100%   BMI 24.69 kg/m   Physical Exam  Constitutional: She is oriented to person, place, and time. She appears well-developed and well-nourished.  Non-toxic appearance. No distress.  HENT:  Head: Normocephalic and atraumatic.  Nose: Nose normal.  Mouth/Throat: Oropharynx is  clear and moist.  Eyes: Conjunctivae are normal. Right eye exhibits no discharge. Left eye exhibits no discharge.  Neck: Normal range of motion. Neck supple. No JVD present. No tracheal deviation present.  Cardiovascular: Normal rate, regular rhythm, normal heart sounds and intact distal pulses. Exam reveals no gallop and no friction rub.  No murmur heard. Pulmonary/Chest: Effort normal and breath sounds normal. No stridor. No respiratory distress. She has no wheezes. She has no rales. She exhibits no tenderness.  No hypoxia or tachypnea.  Hypersensitive to light touch to this area of the chest.  No rashes noted.    Abdominal: Soft. Bowel sounds are normal. She exhibits no distension. There is no tenderness. There is no rebound and no guarding.  Musculoskeletal: Normal range of motion.  No  lower extremity edema or calf tenderness.  Lymphadenopathy:    She has no cervical adenopathy.  Neurological: She is alert and oriented to person, place, and time.  Grip strength equal bilaterally.  Strength lower extremities equal bilaterally.  Skin: Skin is warm and dry. Capillary refill takes less than 2 seconds. She is not diaphoretic.  Psychiatric: Her behavior is normal. Judgment and thought content normal.  Nursing note and vitals reviewed.    ED Treatments / Results  Labs (all labs ordered are listed, but only abnormal results are displayed) Labs Reviewed  BASIC METABOLIC PANEL - Abnormal; Notable for the following components:      Result Value   CO2 21 (*)    All other components within normal limits  CBC - Abnormal; Notable for the following components:   RBC 5.20 (*)    MCH 24.2 (*)    RDW 15.8 (*)    All other components within normal limits  D-DIMER, QUANTITATIVE (NOT AT [facility])  I-STAT TROPONIN, ED  I-STAT BETA HCG BLOOD, ED ([facility], [facility], [facility] ONLY)  I-STAT TROPONIN, ED    EKG EKG Interpretation  Date/Time:  Tuesday March 16 2018 19:07:03 EDT Ventricular Rate:  64 PR  Interval:  150 QRS Duration: 94 QT Interval:  400 QTC Calculation: 412 R Axis:   -12 Text Interpretation:  Normal sinus rhythm Normal ECG No significant change since last tracing Confirmed by Addison Lank [phone]) on 03/17/2018 12:45:53 AM   Radiology Dg Chest 2 View  Result Date: 03/16/2018 CLINICAL DATA:  Chest pain EXAM: CHEST - 2 VIEW COMPARISON:  09/23/2016 FINDINGS: The heart size and mediastinal contours are within normal limits. Both lungs are clear. The visualized skeletal structures are unremarkable. IMPRESSION: No active cardiopulmonary disease. Electronically Signed   By: Donavan Foil M.D.   On: 03/16/2018 19:58    Procedures Procedures (including critical care time)  Medications Ordered in ED Medications  gi cocktail (Maalox,Lidocaine,Donnatal) (30 mLs Oral Given 03/17/18 0008)  ketorolac (TORADOL) 15 MG/ML injection 15 mg (15 mg Intravenous Given 03/17/18 0008)     Initial Impression / Assessment and Plan / ED Course  I have reviewed the triage vital signs and the nursing notes.  Pertinent labs & imaging results that were available during my care of the patient were reviewed by me and considered in my medical decision making (see chart for details).     Pt presents to the Ed today with complaints of cp. Patient is to be discharged with recommendation to follow up with PCP in regards to today's hospital visit. Chest pain is not likely of cardiac or pulmonary etiology d/t presentation, d-dimer negative, VSS, no tracheal deviation, no JVD or new murmur, RRR, breath sounds equal bilaterally, EKG without any change from prior tracing and shows no signs of ischemia, negative delta troponin, and negative CXR.  Labs reassuring.  No leukocytosis.  Normal hemoglobin.  No significant electrolyte derangement.  Heart pathway score is 2.  Patient had some improvement in pain of her tooth and chest with GI cocktail and Toradol.  Pain seems to be musculoskeletal in nature as patient was  very hypersensitive to palpation with light touch of the right upper chest wall.  Sleeping on reexamination.  Patient presentation does not seem consistent with PE, dissection or ACS.  Patient did not have dental work completed to have no concern for endocarditis or pericarditis at this time.  Pt has been advised to return to the ED is CP becomes exertional, associated with diaphoresis  or nausea, radiates to left jaw/arm, worsens or becomes concerning in any way. Pt appears reliable for follow up and is agreeable to discharge.      Final Clinical Impressions(s) / ED Diagnoses   Final diagnoses:  Chest wall pain    ED Discharge Orders         Ordered    methocarbamol (ROBAXIN) 500 MG tablet  2 times daily     03/17/18 0056    lidocaine (LIDODERM) 5 %  Every 24 hours     03/17/18 0056           Doristine Devoid, PA-C 03/17/18 0100    Fatima Blank, MD 03/17/18 [phone]

## 2018-03-25 ENCOUNTER — Other Ambulatory Visit: Payer: Self-pay | Admitting: Internal Medicine

## 2018-03-25 DIAGNOSIS — M62838 Other muscle spasm: Secondary | ICD-10-CM | POA: Diagnosis not present

## 2018-03-25 DIAGNOSIS — R7309 Other abnormal glucose: Secondary | ICD-10-CM | POA: Diagnosis not present

## 2018-03-25 DIAGNOSIS — M797 Fibromyalgia: Secondary | ICD-10-CM | POA: Diagnosis not present

## 2018-03-25 DIAGNOSIS — R058 Other specified cough: Secondary | ICD-10-CM

## 2018-03-25 DIAGNOSIS — R05 Cough: Secondary | ICD-10-CM

## 2018-03-30 DIAGNOSIS — N760 Acute vaginitis: Secondary | ICD-10-CM | POA: Diagnosis not present

## 2018-04-23 ENCOUNTER — Other Ambulatory Visit: Payer: Self-pay | Admitting: Neurology

## 2018-05-15 ENCOUNTER — Ambulatory Visit (HOSPITAL_COMMUNITY)
Admission: EM | Admit: 2018-05-15 | Discharge: 2018-05-15 | Disposition: A | Discharge status: Home / Self Care | Payer: Medicare Other | Attending: Family Medicine | Admitting: Family Medicine

## 2018-05-15 ENCOUNTER — Other Ambulatory Visit: Payer: Self-pay

## 2018-05-15 ENCOUNTER — Encounter (HOSPITAL_COMMUNITY): Payer: Self-pay

## 2018-05-15 DIAGNOSIS — R21 Rash and other nonspecific skin eruption: Secondary | ICD-10-CM | POA: Diagnosis not present

## 2018-05-15 MED ORDER — PREDNISONE 50 MG PO TABS
50.0000 mg | ORAL_TABLET | Freq: Every day | ORAL | 0 refills | Status: DC
Start: 2018-05-15 — End: 2018-07-21

## 2018-05-15 NOTE — ED Provider Notes (Signed)
[facility]    CSN: [id number] Arrival date & time: 05/15/18  1423     History   Chief Complaint Chief Complaint  Patient presents with  . Rash    HPI Kerry Ortiz is a 52 y.o. female.   52 year old female comes in for rash to the whole body that she noticed today. Area is itching in nature, denies pain. States 2 days ago, had a possible insect bite to the left buttock that has been itching, also noticed mild itching to the rectum. She denies any hygiene product change, no interaction with pets. Started clindamycin 05/04/2018 for dental abscess. Denies swelling of the throat, trouble breathing, trouble swallowing. Denies fever, chills, night sweats. Denies URI symptoms such as cough, congestion, sore throat.      Past Medical History:  Diagnosis Date  . Acid reflux   . Asthma   . Disturbance of skin sensation   . Fibromyalgia   . Migraine   . Uterine fibroid     Patient Active Problem List   Diagnosis Date Noted  . Upper airway cough syndrome 11/03/2017  . Leg weakness 05/07/2017  . Hypokalemia 05/07/2017  . Anemia 05/07/2017  . Vertigo 05/07/2017  . Chronic migraine 04/23/2017  . Paresthesia 02/07/2016  . ABDOMINAL BLOATING 11/08/2008  . EPIGASTRIC PAIN 11/08/2008  . DEPRESSION 10/31/2008  . OSTEOARTHRITIS 10/31/2008  . FIBROMYALGIA 10/31/2008  . HEADACHE, CHRONIC 10/31/2008  . ABDOMINAL PAIN, RECURRENT 10/31/2008  . CARPAL TUNNEL SYNDROME, HX OF 10/31/2008    Past Surgical History:  Procedure Laterality Date  . CARPAL TUNNEL RELEASE    . CYSTECTOMY    . HAND SURGERY Right   . SHOULDER SURGERY Right     OB History   None      Home Medications    Prior to Admission medications   Medication Sig Start Date End Date Taking? Authorizing Provider  albuterol (PROVENTIL HFA;VENTOLIN HFA) 108 (90 BASE) MCG/ACT inhaler Inhale 2 puffs into the lungs every 6 (six) hours as needed for wheezing or shortness of breath. 10/24/14  Yes Gregor Hams,  MD  amitriptyline (ELAVIL) 25 MG tablet Take 25 mg by mouth at bedtime. 04/23/15  Yes [provider]  cetirizine (ZYRTEC) 10 MG tablet Take 10 mg by mouth daily.   Yes [provider]  citalopram (CELEXA) 10 MG tablet Take 1 tablet (10 mg total) by mouth daily. 05/03/15  Yes Marcial Pacas, MD  esomeprazole (NEXIUM) 40 MG capsule TAKE ONE CAPSULE BY MOUTH 30 TO 60 MINUTES BEFORE THE FIRST AND LAST MEAL OF THE DAY 03/29/18  Yes Tanda Rockers, MD  fluticasone (FLONASE) 50 MCG/ACT nasal spray Place 2 sprays into both nostrils daily. 09/23/17  Yes Ivi Griffith V, PA-C  meclizine (ANTIVERT) 25 MG tablet Take 1 tablet (25 mg total) by mouth 3 (three) times daily as needed for dizziness. 05/09/17  Yes Mikhail, Maryann, DO  ondansetron (ZOFRAN ODT) 4 MG disintegrating tablet Take 1 tablet (4 mg total) by mouth every 8 (eight) hours as needed. 04/23/17  Yes Marcial Pacas, MD  pregabalin (LYRICA) 200 MG capsule Take 200 mg by mouth 2 (two) times daily.   Yes [provider]  sucralfate (CARAFATE) 1 G tablet Take 1 tablet (1 g total) by mouth 4 (four) times daily. Patient taking differently: Take 1 g by mouth daily.  07/11/12  Yes Malvin Johns, MD  zonisamide (ZONEGRAN) 100 MG capsule Take 1 capsule (100 mg total) by mouth 2 (two) times  daily. 08/20/17  Yes Marcial Pacas, MD  predniSONE (DELTASONE) 50 MG tablet Take 1 tablet (50 mg total) by mouth daily. 05/15/18   Ok Edwards, PA-C    Family History Family History  Problem Relation Age of Onset  . Hypertension Mother   . Arrhythmia Mother   . Thyroid disease Mother   . Pneumonia Father   . Diabetes Maternal Grandmother   . Kidney disease Sister   . Multiple sclerosis Cousin     Social History Social History   Tobacco Use  . Smoking status: Never Smoker  . Smokeless tobacco: Never Used  Substance Use Topics  . Alcohol use: No  . Drug use: No     Allergies   Amoxicillin   Review of Systems Review of Systems  Reason unable to  perform ROS: See HPI as above.     Physical Exam Triage Vital Signs ED Triage Vitals  Enc Vitals Group     BP 05/15/18 1447 110/72     Pulse Rate 05/15/18 1447 78     Resp 05/15/18 1447 16     Temp 05/15/18 1447 98 F (36.7 C)     Temp Source 05/15/18 1447 Oral     SpO2 05/15/18 1447 100 %     Weight --      Height --      Head Circumference --      Peak Flow --      Pain Score 05/15/18 1450 7     Pain Loc --      Pain Edu? --      Excl. in [place]? --    No data found.  Updated Vital Signs BP 110/72 (BP Location: Left Arm)   Pulse 78   Temp 98 F (36.7 C) (Oral)   Resp 16   SpO2 100%   Physical Exam  Constitutional: She is oriented to person, place, and time. She appears well-developed and well-nourished. No distress.  HENT:  Head: Normocephalic and atraumatic.  Eyes: Pupils are equal, round, and reactive to light. Conjunctivae are normal.  Genitourinary: Rectum normal.  Neurological: She is alert and oriented to person, place, and time.  Skin: Skin is warm and dry. She is not diaphoretic.  See picture below. Rash seen to the arms, buttocks, thighs, feet. No erythema, warmth, pain. No rashes seen to the trunk.            UC Treatments / Results  Labs (all labs ordered are listed, but only abnormal results are displayed) Labs Reviewed - No data to display  EKG None  Radiology No results found.  Procedures Procedures (including critical care time)  Medications Ordered in UC Medications - No data to display  Initial Impression / Assessment and Plan / UC Course  I have reviewed the triage vital signs and the nursing notes.  Pertinent labs & imaging results that were available during my care of the patient were reviewed by me and considered in my medical decision making (see chart for details).    Discussed case with Dr Mannie Stabile. Rash appearance more consistent with irritant/allergic dermatitis vs drug reaction. Will start prednisone. Antihistamine for  itching. Patient can continue clindamycin as directed for dental abscess. Return precautions given.  Final Clinical Impressions(s) / UC Diagnoses   Final diagnoses:  Rash    ED Prescriptions    Medication Sig Dispense Auth. Provider   predniSONE (DELTASONE) 50 MG tablet Take 1 tablet (50 mg total) by mouth daily. 5 tablet Tasia Catchings, [place]  Cato Mulligan, Naila Elizondo V, PA-C 05/15/18 1523

## 2018-05-15 NOTE — ED Triage Notes (Signed)
Pt presents today with rash all over body. States she noticed it at 10:00 this morning. She she also has bumps in her rectum area that itch but the rash on body does not itch.

## 2018-05-15 NOTE — Discharge Instructions (Signed)
Start prednisone as directed. You can take over the counter allergy medicine such as zyrtec for itching. Rash inconsistent with drug rash, so you can continue your clindamycin for dental abscess. Monitor closely for any new exposures that could be causing symptoms. Follow up with PCP for further evaluation if symptoms not improving. If experiencing spreading redness, increased warmth, fever, follow up for reevaluation. If experiencing shortness of breath, swelling of the throat, trouble breathing, trouble swallowing, go to the emergency department for further evaluation needed.

## 2018-06-17 DIAGNOSIS — M79643 Pain in unspecified hand: Secondary | ICD-10-CM | POA: Diagnosis not present

## 2018-06-17 DIAGNOSIS — M797 Fibromyalgia: Secondary | ICD-10-CM | POA: Diagnosis not present

## 2018-06-17 DIAGNOSIS — R5383 Other fatigue: Secondary | ICD-10-CM | POA: Diagnosis not present

## 2018-06-17 DIAGNOSIS — Z23 Encounter for immunization: Secondary | ICD-10-CM | POA: Diagnosis not present

## 2018-06-17 DIAGNOSIS — M2550 Pain in unspecified joint: Secondary | ICD-10-CM | POA: Diagnosis not present

## 2018-06-24 ENCOUNTER — Other Ambulatory Visit: Payer: Self-pay | Admitting: Internal Medicine

## 2018-06-24 DIAGNOSIS — R058 Other specified cough: Secondary | ICD-10-CM

## 2018-06-24 DIAGNOSIS — R05 Cough: Secondary | ICD-10-CM

## 2018-07-20 ENCOUNTER — Other Ambulatory Visit: Payer: Self-pay

## 2018-07-20 ENCOUNTER — Encounter (HOSPITAL_COMMUNITY): Payer: Self-pay | Admitting: *Deleted

## 2018-07-20 ENCOUNTER — Emergency Department (HOSPITAL_COMMUNITY)
Admission: EM | Admit: 2018-07-20 | Discharge: 2018-07-21 | Disposition: A | Discharge status: Home / Self Care | Payer: Medicare Other | Attending: Emergency Medicine | Admitting: Emergency Medicine

## 2018-07-20 ENCOUNTER — Emergency Department (HOSPITAL_COMMUNITY): Payer: Medicare Other

## 2018-07-20 DIAGNOSIS — Y939 Activity, unspecified: Secondary | ICD-10-CM | POA: Diagnosis not present

## 2018-07-20 DIAGNOSIS — M545 Low back pain: Secondary | ICD-10-CM | POA: Diagnosis not present

## 2018-07-20 DIAGNOSIS — S4992XA Unspecified injury of left shoulder and upper arm, initial encounter: Secondary | ICD-10-CM | POA: Diagnosis not present

## 2018-07-20 DIAGNOSIS — S46012A Strain of muscle(s) and tendon(s) of the rotator cuff of left shoulder, initial encounter: Secondary | ICD-10-CM | POA: Diagnosis not present

## 2018-07-20 DIAGNOSIS — Y929 Unspecified place or not applicable: Secondary | ICD-10-CM | POA: Diagnosis not present

## 2018-07-20 DIAGNOSIS — S39012A Strain of muscle, fascia and tendon of lower back, initial encounter: Secondary | ICD-10-CM | POA: Insufficient documentation

## 2018-07-20 DIAGNOSIS — J45909 Unspecified asthma, uncomplicated: Secondary | ICD-10-CM | POA: Insufficient documentation

## 2018-07-20 DIAGNOSIS — R079 Chest pain, unspecified: Secondary | ICD-10-CM | POA: Diagnosis not present

## 2018-07-20 DIAGNOSIS — S161XXA Strain of muscle, fascia and tendon at neck level, initial encounter: Secondary | ICD-10-CM | POA: Insufficient documentation

## 2018-07-20 DIAGNOSIS — S46912A Strain of unspecified muscle, fascia and tendon at shoulder and upper arm level, left arm, initial encounter: Secondary | ICD-10-CM | POA: Diagnosis not present

## 2018-07-20 DIAGNOSIS — M542 Cervicalgia: Secondary | ICD-10-CM | POA: Diagnosis not present

## 2018-07-20 DIAGNOSIS — S20212A Contusion of left front wall of thorax, initial encounter: Secondary | ICD-10-CM | POA: Diagnosis not present

## 2018-07-20 DIAGNOSIS — M25512 Pain in left shoulder: Secondary | ICD-10-CM | POA: Diagnosis not present

## 2018-07-20 DIAGNOSIS — Y999 Unspecified external cause status: Secondary | ICD-10-CM | POA: Diagnosis not present

## 2018-07-20 DIAGNOSIS — Z79899 Other long term (current) drug therapy: Secondary | ICD-10-CM | POA: Insufficient documentation

## 2018-07-20 DIAGNOSIS — S2020XA Contusion of thorax, unspecified, initial encounter: Secondary | ICD-10-CM | POA: Diagnosis not present

## 2018-07-20 DIAGNOSIS — S199XXA Unspecified injury of neck, initial encounter: Secondary | ICD-10-CM | POA: Diagnosis not present

## 2018-07-20 DIAGNOSIS — M546 Pain in thoracic spine: Secondary | ICD-10-CM | POA: Diagnosis not present

## 2018-07-20 DIAGNOSIS — S39002A Unspecified injury of muscle, fascia and tendon of lower back, initial encounter: Secondary | ICD-10-CM | POA: Diagnosis not present

## 2018-07-20 MED ORDER — IBUPROFEN 200 MG PO TABS
400.0000 mg | ORAL_TABLET | Freq: Once | ORAL | Status: AC
  Administered 2018-07-20: 400 mg via ORAL
  Filled 2018-07-20: qty 2
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
## 2018-07-21 MED ORDER — HYDROCODONE-ACETAMINOPHEN 5-325 MG PO TABS: 1.0000 | ORAL_TABLET | ORAL | 0 refills | Status: DC | PRN

## 2018-07-21 MED ORDER — CYCLOBENZAPRINE HCL 10 MG PO TABS: 10.0000 mg | ORAL_TABLET | Freq: Three times a day (TID) | ORAL | 0 refills | Status: DC | PRN

## 2018-07-21 NOTE — Discharge Instructions (Addendum)
Apply ice to sore areas for 30 minutes at a time, 3-4 times a day.  Take acetaminophen and/or ibuprofen or naproxen as needed for pain, reserve hydrocodone-acetaminophen for severe pain.

## 2018-08-12 DIAGNOSIS — R4702 Dysphasia: Secondary | ICD-10-CM | POA: Diagnosis not present

## 2018-08-12 DIAGNOSIS — M791 Myalgia, unspecified site: Secondary | ICD-10-CM | POA: Diagnosis not present

## 2018-08-12 DIAGNOSIS — L708 Other acne: Secondary | ICD-10-CM | POA: Diagnosis not present

## 2018-08-12 DIAGNOSIS — R253 Fasciculation: Secondary | ICD-10-CM | POA: Diagnosis not present

## 2018-08-18 DIAGNOSIS — K219 Gastro-esophageal reflux disease without esophagitis: Secondary | ICD-10-CM | POA: Diagnosis not present

## 2018-08-18 DIAGNOSIS — M1990 Unspecified osteoarthritis, unspecified site: Secondary | ICD-10-CM | POA: Diagnosis not present

## 2018-08-18 DIAGNOSIS — K649 Unspecified hemorrhoids: Secondary | ICD-10-CM | POA: Diagnosis not present

## 2018-08-18 DIAGNOSIS — Z23 Encounter for immunization: Secondary | ICD-10-CM | POA: Diagnosis not present

## 2018-08-18 DIAGNOSIS — M797 Fibromyalgia: Secondary | ICD-10-CM | POA: Diagnosis not present

## 2018-08-24 DIAGNOSIS — H2513 Age-related nuclear cataract, bilateral: Secondary | ICD-10-CM | POA: Diagnosis not present

## 2018-08-24 DIAGNOSIS — H524 Presbyopia: Secondary | ICD-10-CM | POA: Diagnosis not present

## 2018-08-24 DIAGNOSIS — H25013 Cortical age-related cataract, bilateral: Secondary | ICD-10-CM | POA: Diagnosis not present

## 2018-08-31 ENCOUNTER — Other Ambulatory Visit (HOSPITAL_COMMUNITY)
Admission: RE | Admit: 2018-08-31 | Discharge: 2018-08-31 | Disposition: A | Discharge status: Home / Self Care | Payer: Medicare Other | Source: Ambulatory Visit | Attending: Obstetrics and Gynecology | Admitting: Obstetrics and Gynecology

## 2018-08-31 ENCOUNTER — Other Ambulatory Visit: Payer: Self-pay | Admitting: Obstetrics and Gynecology

## 2018-08-31 DIAGNOSIS — Z01419 Encounter for gynecological examination (general) (routine) without abnormal findings: Secondary | ICD-10-CM | POA: Insufficient documentation

## 2018-09-02 LAB — CYTOLOGY - PAP
Diagnosis: NEGATIVE
HPV (WINDOPATH): NOT DETECTED

## 2018-09-22 ENCOUNTER — Telehealth: Payer: Self-pay | Admitting: *Deleted

## 2018-09-22 ENCOUNTER — Institutional Professional Consult (permissible substitution): Payer: Medicare Other | Admitting: Neurology

## 2018-09-22 DIAGNOSIS — R6889 Other general symptoms and signs: Secondary | ICD-10-CM | POA: Diagnosis not present

## 2018-09-22 DIAGNOSIS — R05 Cough: Secondary | ICD-10-CM | POA: Diagnosis not present

## 2018-09-22 DIAGNOSIS — J0100 Acute maxillary sinusitis, unspecified: Secondary | ICD-10-CM | POA: Diagnosis not present

## 2018-09-22 NOTE — Telephone Encounter (Signed)
No showed consult appointment.

## 2018-09-24 ENCOUNTER — Other Ambulatory Visit: Payer: Self-pay | Admitting: Neurology

## 2018-09-27 ENCOUNTER — Encounter: Payer: Self-pay | Admitting: Neurology

## 2018-10-02 ENCOUNTER — Other Ambulatory Visit: Payer: Self-pay | Admitting: Internal Medicine

## 2018-10-02 DIAGNOSIS — R058 Other specified cough: Secondary | ICD-10-CM

## 2018-10-02 DIAGNOSIS — R05 Cough: Secondary | ICD-10-CM

## 2018-10-12 DIAGNOSIS — M2550 Pain in unspecified joint: Secondary | ICD-10-CM | POA: Diagnosis not present

## 2018-10-12 DIAGNOSIS — R5383 Other fatigue: Secondary | ICD-10-CM | POA: Diagnosis not present

## 2018-10-12 DIAGNOSIS — M797 Fibromyalgia: Secondary | ICD-10-CM | POA: Diagnosis not present

## 2018-10-12 DIAGNOSIS — M79643 Pain in unspecified hand: Secondary | ICD-10-CM | POA: Diagnosis not present

## 2018-10-20 ENCOUNTER — Other Ambulatory Visit: Payer: Self-pay | Admitting: Neurology

## 2018-11-10 DIAGNOSIS — G894 Chronic pain syndrome: Secondary | ICD-10-CM | POA: Diagnosis not present

## 2018-11-10 DIAGNOSIS — M797 Fibromyalgia: Secondary | ICD-10-CM | POA: Diagnosis not present

## 2018-11-10 DIAGNOSIS — M064 Inflammatory polyarthropathy: Secondary | ICD-10-CM | POA: Diagnosis not present

## 2018-11-20 ENCOUNTER — Other Ambulatory Visit: Payer: Self-pay | Admitting: Neurology
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
## 2018-11-22 ENCOUNTER — Other Ambulatory Visit: Payer: Self-pay | Admitting: Neurology

## 2018-11-30 ENCOUNTER — Institutional Professional Consult (permissible substitution): Payer: Medicare Other | Admitting: Neurology

## 2018-12-08 DIAGNOSIS — M797 Fibromyalgia: Secondary | ICD-10-CM | POA: Diagnosis not present

## 2018-12-08 DIAGNOSIS — R7309 Other abnormal glucose: Secondary | ICD-10-CM | POA: Diagnosis not present

## 2018-12-08 DIAGNOSIS — M62838 Other muscle spasm: Secondary | ICD-10-CM | POA: Diagnosis not present

## 2018-12-08 DIAGNOSIS — N390 Urinary tract infection, site not specified: Secondary | ICD-10-CM | POA: Diagnosis not present

## 2018-12-08 DIAGNOSIS — E7800 Pure hypercholesterolemia, unspecified: Secondary | ICD-10-CM | POA: Diagnosis not present

## 2018-12-09 DIAGNOSIS — R7309 Other abnormal glucose: Secondary | ICD-10-CM | POA: Diagnosis not present

## 2018-12-09 DIAGNOSIS — E7800 Pure hypercholesterolemia, unspecified: Secondary | ICD-10-CM | POA: Diagnosis not present

## 2018-12-09 DIAGNOSIS — Z780 Asymptomatic menopausal state: Secondary | ICD-10-CM | POA: Diagnosis not present

## 2018-12-09 DIAGNOSIS — Z0000 Encounter for general adult medical examination without abnormal findings: Secondary | ICD-10-CM | POA: Diagnosis not present

## 2018-12-09 DIAGNOSIS — K219 Gastro-esophageal reflux disease without esophagitis: Secondary | ICD-10-CM | POA: Diagnosis not present

## 2019-01-20 ENCOUNTER — Other Ambulatory Visit: Payer: Self-pay | Admitting: Neurology

## 2019-02-08 ENCOUNTER — Telehealth: Payer: Self-pay | Admitting: Neurology

## 2019-02-08 ENCOUNTER — Other Ambulatory Visit: Payer: Self-pay

## 2019-02-08 ENCOUNTER — Encounter: Payer: Self-pay | Admitting: Neurology

## 2019-02-08 ENCOUNTER — Ambulatory Visit (INDEPENDENT_AMBULATORY_CARE_PROVIDER_SITE_OTHER): Payer: Medicare Other | Admitting: Neurology

## 2019-02-08 VITALS — BP 111/77 | HR 83 | Temp 97.7°F | Ht 62.0 in | Wt 139.0 lb

## 2019-02-08 DIAGNOSIS — R52 Pain, unspecified: Secondary | ICD-10-CM | POA: Diagnosis not present

## 2019-02-08 DIAGNOSIS — G43709 Chronic migraine without aura, not intractable, without status migrainosus: Secondary | ICD-10-CM

## 2019-02-08 DIAGNOSIS — M5416 Radiculopathy, lumbar region: Secondary | ICD-10-CM | POA: Diagnosis not present

## 2019-02-08 DIAGNOSIS — IMO0002 Reserved for concepts with insufficient information to code with codable children: Secondary | ICD-10-CM

## 2019-02-08 MED ORDER — ZONISAMIDE 100 MG PO CAPS: 100.0000 mg | ORAL_CAPSULE | Freq: Two times a day (BID) | ORAL | 4 refills | Status: DC

## 2019-02-08 MED ORDER — AMITRIPTYLINE HCL 25 MG PO TABS: 25.0000 mg | ORAL_TABLET | Freq: Every day | ORAL | 4 refills | Status: DC

## 2019-02-08 MED ORDER — RIZATRIPTAN BENZOATE 10 MG PO TBDP: 10.0000 mg | ORAL_TABLET | ORAL | 6 refills | Status: DC | PRN

## 2019-02-08 NOTE — Progress Notes (Signed)
PATIENT: Kerry Ortiz DOB: 03-Apr-1966  Chief Complaint  Patient presents with  . Migraine    Reports 1-2 migraines every couple of months that respond well to rizatriptan.   . Word Finding Difficulty    Says she has some intermittent word-finding difficulty since January 2020.  The symptoms have improved since the onset.  She is generally able to think of the word if she slows down.  Denies any memory loss.  Marland Kitchen PCP    Kerry Seashore, MD     HISTORICAL  Kerry Ortiz is a 53 year old female, seen in refer by her primary care doctor Kerry Ortiz for evaluation of chronic migraine, initial evaluation was on April 23 2017.  I reviewed and summarized the referring note, she had past medical history of acid reflux, fibromyalgia, prediabetes, hyperlipidemia, chronic migraine headaches,  She reported history of headaches since age 31, her typical migraine starting from occipital region spreading forward pounding severe headache, associated light noise sensitivity, lasting for a few hours, she has tried over-the-counter Tylenol ibuprofen with limited help, she is now getting more frequent headaches since July 2018 about 2-3 times each week,  She has been evaluated by headache [place] in the past, still taking nortriptyline 25 mg 2 tablets every night, zonisamide 25 mg 3 tablets night as preventative medications,  I saw her previously for diffuse body achy pain, paresthesias throughout her body in 2017,   she had a history of fibromyalgia, carpal tunnel release surgery in the past, chronic migraines, she went on disability since 2008, complains of diffuse body achy pain, on polypharmacy treatment, including Elavil, Lyrica, Zonegran, She continue complains of depression symptoms, tearful during today's interview  Since 2012, she began to notice bilateral feet cold burning sensation, involving bottom and top of her feet, gradually trending up to knee level, getting worse  with pressure, barium weight, she has chronic low back and neck pain, denies radiating pain, gait difficulty due to pain, no bowel and bladder incontinence, no bilateral upper extremity paresthesia or weakness,  She complains of feeling tired all the time, lack of interest,  laboratory in July 2016, A1c was elevated 5.9, normal B12 567, TSH 1.4, CMP, she had more laboratory evaluation in August 2016  She is on polypharmacy treatment, taking Lyrica 150 mg twice a day, Celexa 10 mg daily, she still has intermittent bilateral feet paresthesia, no significant pain  EMG nerve conduction study in December 2016, there is no evidence of large fiber peripheral neuropathy  laboratory evaluation in November 2016:normal CBC, mildly low potassium on BMP 3.4  UPDATE Aug 20 2017: She was admitted to the hospital Oct 4 to 01/2017 with acute onset of dizziness, left lower extremity weakness, had extensive evaluations,  MRI of the brain in October 2018, that was normal.  Laboratory evaluation in October 2018, showed mild anemia hemoglobin of 11.3, CMP showed creatinine 0.85, normal TSH, A1c was 5.8, normal lipid profile, LDL was 90, negative ANA, CPK, ESR, HIV, troponin,  Echocardiogram was normal, Ultrasound of carotid artery showed less than 39% stenosis bilaterally.  Since that visit, she has intermittent dizziness, eye are twitching, ears are hurting.  UPDATE February 08 2019: She continue complains of occasionally migraine headaches, well controlled by Maxalt, she is still taking preventive medication Zonegran 100 mg twice a day, Elavil 25 mg at bedtime  She also complains of diffuse body achy pain, the care of rheumatologist, Plaquenil 400 mg daily,  She also complains 4 months history of  worsening right-sided low back pain, radiating pain to right lower extremity, sometimes difficulty get out of bed  REVIEW OF SYSTEMS: Full 14 system review of systems performed and notable only for as above   ALLERGIES: Allergies  Allergen Reactions  . Amoxicillin     Yeast infections    HOME MEDICATIONS: Current Outpatient Medications  Medication Sig Dispense Refill  . albuterol (PROVENTIL HFA;VENTOLIN HFA) 108 (90 BASE) MCG/ACT inhaler Inhale 2 puffs into the lungs every 6 (six) hours as needed for wheezing or shortness of breath. 1 Inhaler 2  . amitriptyline (ELAVIL) 25 MG tablet Take 25 mg by mouth at bedtime.    . cetirizine (ZYRTEC) 10 MG tablet Take 10 mg by mouth daily.    . citalopram (CELEXA) 10 MG tablet Take 1 tablet (10 mg total) by mouth daily. 30 tablet 6  . cyclobenzaprine (FLEXERIL) 10 MG tablet Take 1 tablet (10 mg total) by mouth 3 (three) times daily as needed for muscle spasms. 30 tablet 0  . esomeprazole (NEXIUM) 40 MG capsule TAKE ONE CAPSULE BY MOUTH TWICE DAILY, 30 TO 60 MINUTES BEFORE FIRST AND LAST MEAL OF THE DAY 180 capsule 0  . fluticasone (FLONASE) 50 MCG/ACT nasal spray Place 2 sprays into both nostrils daily. 1 g 0  . HYDROcodone-acetaminophen (NORCO) 5-325 MG tablet Take 1 tablet by mouth every 4 (four) hours as needed for moderate pain. 10 tablet 0  . hydroxychloroquine (PLAQUENIL) 200 MG tablet Take 400 mg by mouth daily.     . ibuprofen (ADVIL) 800 MG tablet Take 800 mg by mouth 2 (two) times daily as needed.    . meclizine (ANTIVERT) 25 MG tablet Take 1 tablet (25 mg total) by mouth 3 (three) times daily as needed for dizziness. 30 tablet 0  . pregabalin (LYRICA) 200 MG capsule Take 200 mg by mouth 2 (two) times daily.    . rizatriptan (MAXALT-MLT) 10 MG disintegrating tablet Take 1 tab at onset of migraine.  May repeat in 2 hrs, if needed.  Max dose: 2 tabs/day. This is a 30 day prescription.    . sucralfate (CARAFATE) 1 G tablet Take 1 tablet (1 g total) by mouth 4 (four) times daily. (Patient taking differently: Take 1 g by mouth daily. ) 20 tablet 0  . zonisamide (ZONEGRAN) 100 MG capsule TAKE 1 CAPSULE(100 MG) BY MOUTH TWICE DAILY 180 capsule 0   No  current facility-administered medications for this visit.     PAST MEDICAL HISTORY: Past Medical History:  Diagnosis Date  . Acid reflux   . Asthma   . Disturbance of skin sensation   . Fibromyalgia   . Migraine   . Uterine fibroid     PAST SURGICAL HISTORY: Past Surgical History:  Procedure Laterality Date  . CARPAL TUNNEL RELEASE    . CYSTECTOMY    . HAND SURGERY Right   . SHOULDER SURGERY Right     FAMILY HISTORY: Family History  Problem Relation Age of Onset  . Hypertension Mother   . Arrhythmia Mother   . Thyroid disease Mother   . Pneumonia Father   . Diabetes Maternal Grandmother   . Kidney disease Sister   . Multiple sclerosis Cousin     SOCIAL HISTORY:  Social History   Socioeconomic History  . Marital status: Single    Spouse name: Not on file  . Number of children: 1  . Years of education: HS  . Highest education level: Not on file  Occupational History  .   Occupation: Disabled  Social Needs  . Financial resource strain: Not on file  . Food insecurity    Worry: Not on file    Inability: Not on file  . Transportation needs    Medical: Not on file    Non-medical: Not on file  Tobacco Use  . Smoking status: Never Smoker  . Smokeless tobacco: Never Used  Substance and Sexual Activity  . Alcohol use: No  . Drug use: No  . Sexual activity: Not Currently    Birth control/protection: None  Lifestyle  . Physical activity    Days per week: Not on file    Minutes per session: Not on file  . Stress: Not on file  Relationships  . Social connections    Talks on phone: Not on file    Gets together: Not on file    Attends religious service: Not on file    Active member of club or organization: Not on file    Attends meetings of clubs or organizations: Not on file    Relationship status: Not on file  . Intimate partner violence    Fear of current or ex partner: Not on file    Emotionally abused: Not on file    Physically abused: Not on file     Forced sexual activity: Not on file  Other Topics Concern  . Not on file  Social History Narrative   Lives at home alone.   Right-handed.   No caffeine use.     PHYSICAL EXAM   Vitals:   02/08/19 1125  BP: 111/77  Pulse: 83  Temp: 97.7 F (36.5 C)  Weight: 139 lb (63 kg)  Height: 5' 2" (1.575 m)    Not recorded      Body mass index is 25.42 kg/m.  PHYSICAL EXAMNIATION:  Gen: NAD, conversant, well nourised, obese, well groomed                     Cardiovascular: Regular rate rhythm, no peripheral edema, warm, nontender. Eyes: Conjunctivae clear without exudates or hemorrhage Neck: Supple, no carotid bruits. Pulmonary: Clear to auscultation bilaterally   NEUROLOGICAL EXAM:  MENTAL STATUS: Speech:    Speech is normal; fluent and spontaneous with normal comprehension.  Cognition:     Orientation to time, place and person     Normal recent and remote memory     Normal Attention span and concentration     Normal Language, naming, repeating,spontaneous speech     Fund of knowledge   CRANIAL NERVES: CN II: Visual fields are full to confrontation.  Pupils are round equal and briskly reactive to light. CN III, IV, VI: extraocular movement are normal. No ptosis. CN V: Facial sensation is intact to pinprick in all 3 divisions bilaterally. Corneal responses are intact.  CN VII: Face is symmetric with normal eye closure and smile. CN VIII: Hearing is normal to rubbing fingers CN IX, X: Palate elevates symmetrically. Phonation is normal. CN XI: Head turning and shoulder shrug are intact CN XII: Tongue is midline with normal movements and no atrophy.  MOTOR: There is no pronator drift of out-stretched arms. Muscle bulk and tone are normal. Muscle strength is normal.  REFLEXES: Reflexes are 2+ and symmetric at the biceps, triceps, knees, and ankles. Plantar responses are flexor.  SENSORY: Intact to light touch, pinprick, positional sensation and vibratory sensation are  intact in fingers and toes.  COORDINATION: Rapid alternating movements and fine finger movements are intact. There is   no dysmetria on finger-to-nose and heel-knee-shin.    GAIT/STANCE: Posture is normal. Gait is steady with normal steps, base, arm swing, and turning.  Mildly antalgic  DIAGNOSTIC DATA (LABS, IMAGING, TESTING) - I reviewed patient records, labs, notes, testing and imaging myself where available.   ASSESSMENT AND PLAN  Jobina C Lukasiewicz is a 53 y.o. female   Chronic migraine headache  Continue preventive medication zonisamide to 100 mg twice a day, Elavil 25 mg at bedtime  Maxalt as needed  Right lumbar radiculopathy  Complete evaluation with MRI of lumbar  EMG nerve conduction study    Yijun Yan, M.D. Ph.D.  [facility] [address], [address] [place], [place] [address] Ph: [phone] Fax: [phone]  CC: Ramachandran, Ajith, MD 

## 2019-02-08 NOTE — Telephone Encounter (Signed)
[employer] medicare/medicaid order sent to [facility]. No auth they will reach out to the patient to schedule.  °

## 2019-02-17 DIAGNOSIS — M2550 Pain in unspecified joint: Secondary | ICD-10-CM | POA: Diagnosis not present

## 2019-02-17 DIAGNOSIS — M797 Fibromyalgia: Secondary | ICD-10-CM | POA: Diagnosis not present

## 2019-02-17 DIAGNOSIS — M79643 Pain in unspecified hand: Secondary | ICD-10-CM | POA: Diagnosis not present

## 2019-02-17 DIAGNOSIS — R5383 Other fatigue: Secondary | ICD-10-CM | POA: Diagnosis not present

## 2019-02-24 DIAGNOSIS — M25562 Pain in left knee: Secondary | ICD-10-CM | POA: Diagnosis not present

## 2019-02-24 DIAGNOSIS — M25569 Pain in unspecified knee: Secondary | ICD-10-CM | POA: Diagnosis not present

## 2019-03-01 DIAGNOSIS — M545 Low back pain: Secondary | ICD-10-CM | POA: Diagnosis not present

## 2019-03-03 DIAGNOSIS — H8110 Benign paroxysmal vertigo, unspecified ear: Secondary | ICD-10-CM | POA: Diagnosis not present

## 2019-03-04 ENCOUNTER — Other Ambulatory Visit: Payer: Self-pay

## 2019-03-04 ENCOUNTER — Ambulatory Visit
Admission: RE | Admit: 2019-03-04 | Discharge: 2019-03-04 | Disposition: A | Discharge status: Home / Self Care | Payer: Medicare Other | Source: Ambulatory Visit | Attending: Neurology | Admitting: Neurology

## 2019-03-04 DIAGNOSIS — R52 Pain, unspecified: Secondary | ICD-10-CM

## 2019-03-04 DIAGNOSIS — G43709 Chronic migraine without aura, not intractable, without status migrainosus: Secondary | ICD-10-CM | POA: Diagnosis not present

## 2019-03-04 DIAGNOSIS — IMO0002 Reserved for concepts with insufficient information to code with codable children: Secondary | ICD-10-CM

## 2019-03-04 DIAGNOSIS — M5416 Radiculopathy, lumbar region: Secondary | ICD-10-CM

## 2019-03-06 ENCOUNTER — Other Ambulatory Visit: Payer: Self-pay

## 2019-03-06 ENCOUNTER — Ambulatory Visit (HOSPITAL_COMMUNITY)
Admission: EM | Admit: 2019-03-06 | Discharge: 2019-03-06 | Disposition: A | Discharge status: Home / Self Care | Payer: Medicare Other | Attending: Urgent Care | Admitting: Urgent Care

## 2019-03-06 DIAGNOSIS — M797 Fibromyalgia: Secondary | ICD-10-CM | POA: Insufficient documentation

## 2019-03-06 DIAGNOSIS — Z20828 Contact with and (suspected) exposure to other viral communicable diseases: Secondary | ICD-10-CM | POA: Insufficient documentation

## 2019-03-06 DIAGNOSIS — J45909 Unspecified asthma, uncomplicated: Secondary | ICD-10-CM | POA: Insufficient documentation

## 2019-03-06 DIAGNOSIS — Z79899 Other long term (current) drug therapy: Secondary | ICD-10-CM | POA: Insufficient documentation

## 2019-03-06 DIAGNOSIS — K219 Gastro-esophageal reflux disease without esophagitis: Secondary | ICD-10-CM | POA: Diagnosis not present

## 2019-03-06 DIAGNOSIS — J029 Acute pharyngitis, unspecified: Secondary | ICD-10-CM | POA: Diagnosis not present

## 2019-03-06 LAB — POCT RAPID STREP A: Streptococcus, Group A Screen (Direct): NEGATIVE

## 2019-03-06 NOTE — ED Provider Notes (Signed)
MRN: [id number] DOB: 1966/07/08  Subjective:   Kerry Ortiz is a 53 y.o. female presenting for 1 day history of moderate persistent sharp throat pain.  Feels a scratchy throat.  Denies any COVID-19 contacts, would like to make sure that is not the case.  No current facility-administered medications for this encounter.   Current Outpatient Medications:  .  albuterol (PROVENTIL HFA;VENTOLIN HFA) 108 (90 BASE) MCG/ACT inhaler, Inhale 2 puffs into the lungs every 6 (six) hours as needed for wheezing or shortness of breath., Disp: 1 Inhaler, Rfl: 2 .  amitriptyline (ELAVIL) 25 MG tablet, Take 1 tablet (25 mg total) by mouth at bedtime., Disp: 90 tablet, Rfl: 4 .  cetirizine (ZYRTEC) 10 MG tablet, Take 10 mg by mouth daily., Disp: , Rfl:  .  citalopram (CELEXA) 10 MG tablet, Take 1 tablet (10 mg total) by mouth daily., Disp: 30 tablet, Rfl: 6 .  cyclobenzaprine (FLEXERIL) 10 MG tablet, Take 1 tablet (10 mg total) by mouth 3 (three) times daily as needed for muscle spasms., Disp: 30 tablet, Rfl: 0 .  esomeprazole (NEXIUM) 40 MG capsule, TAKE ONE CAPSULE BY MOUTH TWICE DAILY, 30 TO 60 MINUTES BEFORE FIRST AND LAST MEAL OF THE DAY, Disp: 180 capsule, Rfl: 0 .  fluticasone (FLONASE) 50 MCG/ACT nasal spray, Place 2 sprays into both nostrils daily., Disp: 1 g, Rfl: 0 .  HYDROcodone-acetaminophen (NORCO) 5-325 MG tablet, Take 1 tablet by mouth every 4 (four) hours as needed for moderate pain., Disp: 10 tablet, Rfl: 0 .  hydroxychloroquine (PLAQUENIL) 200 MG tablet, Take 400 mg by mouth daily. , Disp: , Rfl:  .  ibuprofen (ADVIL) 800 MG tablet, Take 800 mg by mouth 2 (two) times daily as needed., Disp: , Rfl:  .  meclizine (ANTIVERT) 25 MG tablet, Take 1 tablet (25 mg total) by mouth 3 (three) times daily as needed for dizziness., Disp: 30 tablet, Rfl: 0 .  pregabalin (LYRICA) 200 MG capsule, Take 200 mg by mouth 2 (two) times daily., Disp: , Rfl:  .  rizatriptan (MAXALT-MLT) 10 MG disintegrating tablet,  Take 1 tablet (10 mg total) by mouth as needed for migraine. Take 1 tab at onset of migraine.  May repeat in 2 hrs, if needed.  Max dose: 2 tabs/day. This is a 30 day prescription., Disp: 10 tablet, Rfl: 6 .  sucralfate (CARAFATE) 1 G tablet, Take 1 tablet (1 g total) by mouth 4 (four) times daily. (Patient taking differently: Take 1 g by mouth daily. ), Disp: 20 tablet, Rfl: 0 .  zonisamide (ZONEGRAN) 100 MG capsule, Take 1 capsule (100 mg total) by mouth 2 (two) times daily., Disp: 180 capsule, Rfl: 4    Allergies  Allergen Reactions  . Amoxicillin     Yeast infections    Past Medical History:  Diagnosis Date  . Acid reflux   . Asthma   . Disturbance of skin sensation   . Fibromyalgia   . Migraine   . Uterine fibroid      Past Surgical History:  Procedure Laterality Date  . CARPAL TUNNEL RELEASE    . CYSTECTOMY    . HAND SURGERY Right   . SHOULDER SURGERY Right     Review of Systems  Constitutional: Negative for fever and malaise/fatigue.  HENT: Positive for sore throat. Negative for congestion, ear pain and sinus pain.   Eyes: Negative for blurred vision, double vision, discharge and redness.  Respiratory: Negative for cough, hemoptysis, shortness of breath and wheezing.  Cardiovascular: Negative for chest pain.  Gastrointestinal: Negative for abdominal pain, diarrhea, nausea and vomiting.  Genitourinary: Negative for dysuria, flank pain and hematuria.  Musculoskeletal: Negative for myalgias.  Skin: Negative for rash.  Neurological: Negative for dizziness, weakness and headaches.  Psychiatric/Behavioral: Negative for depression and substance abuse.    Objective:   Vitals: BP 127/84 (BP Location: Right Arm)   Pulse 81   Temp 98.5 F (36.9 C) (Oral)   Resp 16   SpO2 100%   Physical Exam Constitutional:      General: She is not in acute distress.    Appearance: She is well-developed. She is not ill-appearing.  HENT:     Head: Normocephalic and atraumatic.      Right Ear: Tympanic membrane and ear canal normal. No drainage or tenderness. No middle ear effusion. Tympanic membrane is not erythematous.     Left Ear: Tympanic membrane and ear canal normal. No drainage or tenderness.  No middle ear effusion. Tympanic membrane is not erythematous.     Nose: No congestion or rhinorrhea.     Mouth/Throat:     Mouth: Mucous membranes are moist. No oral lesions.     Pharynx: Oropharynx is clear. No pharyngeal swelling, oropharyngeal exudate, posterior oropharyngeal erythema or uvula swelling.     Tonsils: No tonsillar exudate or tonsillar abscesses.  Eyes:     Extraocular Movements:     Right eye: Normal extraocular motion.     Left eye: Normal extraocular motion.     Conjunctiva/sclera: Conjunctivae normal.     Pupils: Pupils are equal, round, and reactive to light.  Neck:     Musculoskeletal: Normal range of motion and neck supple.  Cardiovascular:     Rate and Rhythm: Normal rate.  Pulmonary:     Effort: Pulmonary effort is normal.  Lymphadenopathy:     Cervical: No cervical adenopathy.  Skin:    General: Skin is warm and dry.  Neurological:     General: No focal deficit present.     Mental Status: She is alert and oriented to person, place, and time.  Psychiatric:        Mood and Affect: Mood normal.        Behavior: Behavior normal.     Results for orders placed or performed during the hospital encounter of 03/06/19 (from the past 24 hour(s))  POCT rapid strep A [facility])     Status: None   Collection Time: 03/06/19 12:43 PM  Result Value Ref Range   Streptococcus, Group A Screen (Direct) NEGATIVE NEGATIVE     Assessment and Plan :   1. Viral pharyngitis   2. Sore throat     Likely viral in etiology. Counseled patient on nature of COVID-19 including modes of transmission, diagnostic testing, management and supportive care.  Offered symptomatic relief. COVID 19 testing is pending. Counseled patient on potential for adverse  effects with medications prescribed/recommended today, ER and return-to-clinic precautions discussed, patient verbalized understanding.     Jaynee Eagles, PA-C 03/06/19 1245

## 2019-03-06 NOTE — Discharge Instructions (Addendum)

## 2019-03-06 NOTE — ED Triage Notes (Signed)
Per pt she has been having a scratchy throat since last night. No swelling no fevers, no cough no chills

## 2019-03-07 LAB — NOVEL CORONAVIRUS, NAA (HOSP ORDER, SEND-OUT TO REF LAB; TAT 18-24 HRS): SARS-CoV-2, NAA: NOT DETECTED

## 2019-03-08 ENCOUNTER — Telehealth: Payer: Self-pay | Admitting: Neurology

## 2019-03-08 LAB — CULTURE, GROUP A STREP (THRC)

## 2019-03-08 NOTE — Telephone Encounter (Signed)
-----   Message from Darleen Crocker, RN sent at 03/08/2019  9:17 AM EDT -----  ----- Message ----- From: Marcial Pacas, MD Sent: 03/07/2019   3:04 PM EDT To: Desmond Lope, RN  Please call patient for [facility] MRI lumbar

## 2019-03-08 NOTE — Telephone Encounter (Signed)
Called the pt to review the MRI results of the lumbar spine.advised the pt Dr Krista Blue reviewed and stated was normal MRI. She didn't see anything of any concern. Advised the patient that we will still complete the NCV/EMG results. Pt verbalized understanding. Pt had no questions at this time but was encouraged to call back if questions arise.

## 2019-03-21 DIAGNOSIS — Z1231 Encounter for screening mammogram for malignant neoplasm of breast: Secondary | ICD-10-CM | POA: Diagnosis not present

## 2019-03-31 DIAGNOSIS — J45909 Unspecified asthma, uncomplicated: Secondary | ICD-10-CM | POA: Diagnosis not present

## 2019-03-31 DIAGNOSIS — J309 Allergic rhinitis, unspecified: Secondary | ICD-10-CM | POA: Diagnosis not present

## 2019-04-15 DIAGNOSIS — M25562 Pain in left knee: Secondary | ICD-10-CM | POA: Diagnosis not present

## 2019-04-15 DIAGNOSIS — R5383 Other fatigue: Secondary | ICD-10-CM | POA: Diagnosis not present

## 2019-04-15 DIAGNOSIS — M79643 Pain in unspecified hand: Secondary | ICD-10-CM | POA: Diagnosis not present

## 2019-04-15 DIAGNOSIS — M797 Fibromyalgia: Secondary | ICD-10-CM | POA: Diagnosis not present

## 2019-04-15 DIAGNOSIS — M2550 Pain in unspecified joint: Secondary | ICD-10-CM | POA: Diagnosis not present

## 2019-04-18 ENCOUNTER — Encounter (INDEPENDENT_AMBULATORY_CARE_PROVIDER_SITE_OTHER): Payer: Medicare Other | Admitting: Neurology

## 2019-04-18 ENCOUNTER — Encounter

## 2019-04-18 ENCOUNTER — Other Ambulatory Visit: Payer: Self-pay

## 2019-04-18 ENCOUNTER — Ambulatory Visit (INDEPENDENT_AMBULATORY_CARE_PROVIDER_SITE_OTHER): Payer: Medicare Other | Admitting: Neurology

## 2019-04-18 DIAGNOSIS — M5416 Radiculopathy, lumbar region: Secondary | ICD-10-CM | POA: Diagnosis not present

## 2019-04-18 DIAGNOSIS — IMO0002 Reserved for concepts with insufficient information to code with codable children: Secondary | ICD-10-CM

## 2019-04-18 DIAGNOSIS — G43709 Chronic migraine without aura, not intractable, without status migrainosus: Secondary | ICD-10-CM

## 2019-04-18 DIAGNOSIS — R52 Pain, unspecified: Secondary | ICD-10-CM

## 2019-04-18 DIAGNOSIS — Z0289 Encounter for other administrative examinations: Secondary | ICD-10-CM

## 2019-04-18 NOTE — Procedures (Signed)
Full Name: Kerry Ortiz Gender: Female MRN #: [id number] Date of Birth: September 11, 1965    Visit Date: 04/18/2019 13:22 Age: 53 Years 41 Months Old Examining Physician: Marcial Pacas, MD  Referring Physician: Marcial Pacas, MD History: 53 year old female, complains of right-sided low back pain, radiating pain to right lower extremity.  Summary of tests: Nerve conduction study: Bilateral superficial peroneal and sural sensory responses are normal.  Bilateral tibial, peroneal to EDB motor responses were normal.  Electromyography: Selected needle examination of right lower extremity muscles and right lumbosacral paraspinal muscles were normal.   Conclusion: This is a normal study.  There is no electrodiagnostic evidence of large fiber peripheral neuropathy or right lumbosacral radiculopathy.    ------------------------------- Marcial Pacas, M.D. PhD  [facility] [place], [place] [address] Tel: [phone] Fax: [phone]        [facility]    Nerve / Sites Muscle Latency Ref. Amplitude Ref. Rel Amp Segments Distance Velocity Ref. Area    ms ms mV mV %  cm m/s m/s mVms  R Peroneal - EDB     Ankle EDB 3.8 ?6.5 7.0 ?2.0 100 Ankle - EDB 9   20.6     Fib head EDB 8.6  6.5  92.6 Fib head - Ankle 25 51 ?44 19.7     Pop fossa EDB 10.6  5.7  87.6 Pop fossa - Fib head 10 51 ?44 17.4         Pop fossa - Ankle      L Peroneal - EDB     Ankle EDB 5.3 ?6.5 3.1 ?2.0 100 Ankle - EDB 9   8.8     Fib head EDB 10.4  2.6  83 Fib head - Ankle 26 51 ?44 8.1     Pop fossa EDB 12.3  2.7  105 Pop fossa - Fib head 10 52 ?44 7.6         Pop fossa - Ankle      R Tibial - AH     Ankle AH 3.4 ?5.8 8.0 ?4.0 100 Ankle - AH 9   11.4     Pop fossa AH 10.2  6.7  83.1 Pop fossa - Ankle 35 52 ?41 13.0  L Tibial - AH     Ankle AH 3.1 ?5.8 10.4 ?4.0 100 Ankle - AH 9   20.4     Pop fossa AH 10.5  7.6  73.1 Pop fossa - Ankle 32 44 ?41 17.9             SNC    Nerve / Sites Rec. Site Peak  Lat Ref.  Amp Ref. Segments Distance    ms ms V V  cm  R Sural - Ankle (Calf)     Calf Ankle 2.9 ?4.4 10 ?6 Calf - Ankle 14  L Sural - Ankle (Calf)     Calf Ankle 3.0 ?4.4 12 ?6 Calf - Ankle 14  R Superficial peroneal - Ankle     Lat leg Ankle 3.6 ?4.4 8 ?6 Lat leg - Ankle 14  L Superficial peroneal - Ankle     Lat leg Ankle 3.7 ?4.4 12 ?6 Lat leg - Ankle 14              F  Wave    Nerve F Lat Ref.   ms ms  R Tibial - AH 46.8 ?56.0  L Tibial - AH 48.6 ?56.0  EMG       EMG Summary Table    Spontaneous MUAP Recruitment  Muscle IA Fib PSW Fasc Other Amp Dur. Poly Pattern  R. Tibialis anterior Normal None None None _______ Normal Normal Normal Normal  R. Tibialis posterior Normal None None None _______ Normal Normal Normal Normal  R. Gastrocnemius (Medial head) Normal None None None _______ Normal Normal Normal Normal  R. Peroneus longus Normal None None None _______ Normal Normal Normal Normal  R. Vastus lateralis Normal None None None _______ Normal Normal Normal Normal  R. Lumbar paraspinals (mid) Normal None None None _______ Normal Normal Normal Normal  R. Lumbar paraspinals (low) Normal None None None _______ Normal Normal Normal Normal

## 2019-04-20 ENCOUNTER — Telehealth: Payer: Self-pay | Admitting: Neurology

## 2019-04-20 MED ORDER — DULOXETINE HCL 60 MG PO CPEP: 60.0000 mg | ORAL_CAPSULE | Freq: Every day | ORAL | 3 refills | Status: DC

## 2019-04-20 NOTE — Telephone Encounter (Signed)
Pt called stating that the pharmacy has not received the prescription for Cymbalta that was to be called in for her. Please advise.

## 2019-04-20 NOTE — Telephone Encounter (Signed)
Per vo by Dr. Krista Blue, send in rx for duloxetine 60mg , one capsule daily.  #90 x 3.  Rx sent to pharmacy and patient aware.

## 2019-05-03 DIAGNOSIS — M2242 Chondromalacia patellae, left knee: Secondary | ICD-10-CM | POA: Diagnosis not present

## 2019-05-03 DIAGNOSIS — M7052 Other bursitis of knee, left knee: Secondary | ICD-10-CM | POA: Insufficient documentation

## 2019-05-03 DIAGNOSIS — M25562 Pain in left knee: Secondary | ICD-10-CM | POA: Diagnosis not present

## 2019-05-17 DIAGNOSIS — M2242 Chondromalacia patellae, left knee: Secondary | ICD-10-CM | POA: Diagnosis not present

## 2019-05-17 DIAGNOSIS — M7052 Other bursitis of knee, left knee: Secondary | ICD-10-CM | POA: Diagnosis not present

## 2019-05-31 DIAGNOSIS — M2242 Chondromalacia patellae, left knee: Secondary | ICD-10-CM | POA: Diagnosis not present

## 2019-05-31 DIAGNOSIS — M7052 Other bursitis of knee, left knee: Secondary | ICD-10-CM | POA: Diagnosis not present

## 2019-06-03 DIAGNOSIS — M2242 Chondromalacia patellae, left knee: Secondary | ICD-10-CM | POA: Diagnosis not present

## 2019-06-03 DIAGNOSIS — M7052 Other bursitis of knee, left knee: Secondary | ICD-10-CM | POA: Diagnosis not present

## 2019-06-07 DIAGNOSIS — E7800 Pure hypercholesterolemia, unspecified: Secondary | ICD-10-CM | POA: Diagnosis not present

## 2019-06-07 DIAGNOSIS — R7309 Other abnormal glucose: Secondary | ICD-10-CM | POA: Diagnosis not present

## 2019-06-14 DIAGNOSIS — M7052 Other bursitis of knee, left knee: Secondary | ICD-10-CM | POA: Diagnosis not present

## 2019-06-14 DIAGNOSIS — R7309 Other abnormal glucose: Secondary | ICD-10-CM | POA: Diagnosis not present

## 2019-06-14 DIAGNOSIS — M2242 Chondromalacia patellae, left knee: Secondary | ICD-10-CM | POA: Diagnosis not present

## 2019-06-14 DIAGNOSIS — M797 Fibromyalgia: Secondary | ICD-10-CM | POA: Diagnosis not present

## 2019-06-14 DIAGNOSIS — J309 Allergic rhinitis, unspecified: Secondary | ICD-10-CM | POA: Diagnosis not present

## 2019-06-14 DIAGNOSIS — M1990 Unspecified osteoarthritis, unspecified site: Secondary | ICD-10-CM | POA: Diagnosis not present

## 2019-06-23 DIAGNOSIS — M545 Low back pain: Secondary | ICD-10-CM | POA: Diagnosis not present

## 2019-06-23 DIAGNOSIS — M1990 Unspecified osteoarthritis, unspecified site: Secondary | ICD-10-CM | POA: Diagnosis not present

## 2019-06-23 DIAGNOSIS — M549 Dorsalgia, unspecified: Secondary | ICD-10-CM | POA: Diagnosis not present

## 2019-06-23 DIAGNOSIS — M533 Sacrococcygeal disorders, not elsewhere classified: Secondary | ICD-10-CM | POA: Diagnosis not present

## 2019-06-28 DIAGNOSIS — M2550 Pain in unspecified joint: Secondary | ICD-10-CM | POA: Diagnosis not present

## 2019-06-28 DIAGNOSIS — M797 Fibromyalgia: Secondary | ICD-10-CM | POA: Diagnosis not present

## 2019-06-28 DIAGNOSIS — M79643 Pain in unspecified hand: Secondary | ICD-10-CM | POA: Diagnosis not present

## 2019-06-28 DIAGNOSIS — R5383 Other fatigue: Secondary | ICD-10-CM | POA: Diagnosis not present

## 2019-07-15 DIAGNOSIS — R05 Cough: Secondary | ICD-10-CM | POA: Diagnosis not present

## 2019-07-21 ENCOUNTER — Telehealth: Payer: Self-pay | Admitting: Internal Medicine

## 2019-07-21 NOTE — Telephone Encounter (Signed)
Called and spoke with pt letting her know that Dr. Redmond Baseman wanted [place] to call her to get her in for an appt with MW to reevaluate her cough. Pt verbalized understanding. appt scheduled for pt with MW 12/22 at 1:45. Nothing further needed.

## 2019-07-26 ENCOUNTER — Ambulatory Visit (INDEPENDENT_AMBULATORY_CARE_PROVIDER_SITE_OTHER): Payer: Medicare Other | Admitting: Internal Medicine

## 2019-07-26 ENCOUNTER — Encounter: Payer: Self-pay | Admitting: Internal Medicine

## 2019-07-26 ENCOUNTER — Ambulatory Visit (INDEPENDENT_AMBULATORY_CARE_PROVIDER_SITE_OTHER): Payer: Medicare Other

## 2019-07-26 ENCOUNTER — Other Ambulatory Visit: Payer: Self-pay

## 2019-07-26 DIAGNOSIS — R058 Other specified cough: Secondary | ICD-10-CM

## 2019-07-26 DIAGNOSIS — R05 Cough: Secondary | ICD-10-CM

## 2019-07-26 NOTE — Progress Notes (Signed)
Subjective:     Patient ID: Kerry Ortiz, female   DOB: 1965/12/01,     MRN: [id number]   Brief patient profile:  89 yobf never smoker ? Asthma as infant and in HS developed recurrent abd problems dx as ulcers / gerd then around 2005 dx as fibromyalgia/ migraines then around 2015 developed indolent onset daily/noct cough dx as asthma > not much better p started rx with inhalers never returned to ER until 09/23/17 > no better with pred/duler  200 so referred to pulmonary clinic 11/03/2017 by Dr   Gayla Doss    History of Present Illness  11/03/2017 [facility] office visit/ Laketha Leopard   Chief Complaint  Patient presents with  . Pulmonary Consult    Referred by Dr. Ashby Dawes for eval of Asthma.  She is c/o cough x 2 months. She is coughing up some yellow to clear sputum "feels like I'm choking".  She states the cough tends to get worse in the evenings and also when she gets hot.  She uses her albuterol inhaler 3 x daily on average.    Kouffman Reflux v Neurogenic Cough Differentiator Reflux Comments  Do you awaken from a sound sleep coughing violently?                            With trouble breathing? Yes nightly yes   Do you have choking episodes when you cannot  Get enough air, gasping for air ?              Yes   Do you usually cough when you lie down into  The bed, or when you just lie down to rest ?                          After a while = couple of min   Do you usually cough after meals or eating?         Crackers make it happen   Do you cough when (or after) you bend over?    Yes occ    GERD SCORE     Kouffman Reflux v Neurogenic Cough Differentiator Neurogenic   Do you more-or-less cough all day long? sporadic   Does change of temperature make you cough? Hot worse   Does laughing or chuckling cause you to cough? yes   Do fumes (perfume, automobile fumes, burned  Toast, etc.,) cause you to cough ?      Yes    Does speaking, singing, or talking on the phone cause you to cough    ?               Yes    Neurogenic/Airway score       Cough drops make it worse/ sob mostly with coughing / some gen ant chest discomfort during coughing fits  rec Change nexium to  Where you take it Take 30- 60 min before your first and last meals of the day  Take delsym two tsp every 12 hours and supplement if needed with  Tylenol #3  up to 1-2 every 4 hours to suppress the urge to cough. Swallowing water and/or using ice chips/non mint and menthol containing candies (such as lifesavers or sugarless jolly ranchers) are also effective.   Once you have eliminated the cough for 3 straight days try reducing the Tylenol #3  first,  then the delsym as tolerated.   Please schedule  a follow up office visit in 2 weeks, sooner if needed  with all medications /inhalers/ solutions in hand so we can verify exactly what you are taking. This includes all medications from all doctors and over the counters  next:  Consider 1st gen H1 blockers per guidelines / methacholine challenge while on max gerd rx      11/17/2017  f/u ov/Hudsyn Champine re: cough x 2015, did not follow recs/ no meds or candy handy Chief Complaint  Patient presents with  . Follow-up    2 week f/u for cough, reports still has dry cough, increased SOB with humidity, orthopnea x2 pillows.   Dyspnea:  As long as not coughing >>  breathing ok  Cough: no particular time of day /noct but def with voice use or perfume/ assoc with sense of pnds "constant" with choking sensation  Sleep: better with Tylenol #3 but never used more than 2 in 24 h SABA use: not  Needed rec For drainage / throat tickle try take CHLORPHENIRAMINE  4 mg - take one every 4 hours as needed   If this isn't effective add tylenel #3 up to 2 every 4 hours until no cough x 3 days then try off  And start Prednisone 10 mg take  4 each am x 2 days,   2 each am x 2 days,  1 each am x 2 days and stop       12/04/2017  f/u ov/Willer Osorno re: cough x 2015 / did not use h1 as rec  - did bring all  meds as req  Chief Complaint  Patient presents with  . Follow-up    Cough has improved some, but she is still having CP and SOB the same since the last visit. She has not had to use her albuterol inhaler but has been using an empty Dulera inhaler 2-3 x per day.   Dyspnea:  Not unless coughing and no worse off dulera Cough: some better overall but s change in pattern   Sleep: better  SABA use:  None  rec For drainage / throat tickle try take CHLORPHENIRAMINE  4 mg - take one every 4 hours as needed    schedule methacholine challenge test  Neg > ref to Dr Bettina Gavia at [facility] or  can return to ent of choice    07/26/2019  f/u ov/Hailyn Zarr re:  Re establish re cough x 2015 / did not bring meals / sense of globus/gag/vomiting Chief Complaint  Patient presents with  . Follow-up    Upper airway cough syndrome  Dyspnea:  No reg exercise  Cough: dry/ worse p lie down swallowing water helps / jollys ranchers also help  Sleeping: does not typically wake her up  SABA use: once a week 02: none    No obvious day to day or daytime variability or assoc excess/ purulent sputum or mucus plugs or hemoptysis or cp or chest tightness, subjective wheeze or overt sinus or hb symptoms.   Sleeping as above without nocturnal  or early am exacerbation  of respiratory  c/o's or need for noct saba. Also denies any obvious fluctuation of symptoms with weather or environmental changes or other aggravating or alleviating factors except as outlined above   No unusual exposure hx or h/o childhood pna/ asthma or knowledge of premature birth.  Current Allergies, Complete Past Medical History, Past Surgical History, Family History, and Social History were reviewed in [employer] record.  ROS  The following are not active  complaints unless bolded Hoarseness, sore throat, dysphagia, dental problems, itching, sneezing,  nasal congestion or discharge of excess mucus or purulent secretions, ear ache,    fever, chills, sweats, unintended wt loss or wt gain, classically pleuritic or exertional cp,  orthopnea pnd or arm/hand swelling  or leg swelling, presyncope, palpitations, abdominal pain, anorexia, nausea, vomiting, diarrhea  or change in bowel habits or change in bladder habits, change in stools or change in urine, dysuria, hematuria,  rash, arthralgias, visual complaints, headache, numbness, weakness or ataxia or problems with walking or coordination,  change in mood or  memory.        Current Meds - - NOTE:   Unable to verify as accurately reflecting what pt takes     Medication Sig  . albuterol (PROVENTIL HFA;VENTOLIN HFA) 108 (90 BASE) MCG/ACT inhaler Inhale 2 puffs into the lungs every 6 (six) hours as needed for wheezing or shortness of breath.  Marland Kitchen amitriptyline (ELAVIL) 25 MG tablet Take 1 tablet (25 mg total) by mouth at bedtime.  . cetirizine (ZYRTEC) 10 MG tablet Take 10 mg by mouth daily.  . citalopram (CELEXA) 10 MG tablet Take 1 tablet (10 mg total) by mouth daily.  . cyclobenzaprine (FLEXERIL) 10 MG tablet Take 1 tablet (10 mg total) by mouth 3 (three) times daily as needed for muscle spasms.  . DULoxetine (CYMBALTA) 60 MG capsule Take 1 capsule (60 mg total) by mouth daily.  Marland Kitchen esomeprazole (NEXIUM) 40 MG capsule TAKE ONE CAPSULE BY MOUTH TWICE DAILY, 30 TO 60 MINUTES BEFORE FIRST AND LAST MEAL OF THE DAY  . fluticasone (FLONASE) 50 MCG/ACT nasal spray Place 2 sprays into both nostrils daily.  Marland Kitchen HYDROcodone-acetaminophen (NORCO) 5-325 MG tablet Take 1 tablet by mouth every 4 (four) hours as needed for moderate pain.  . hydroxychloroquine (PLAQUENIL) 200 MG tablet Take 400 mg by mouth daily.   Marland Kitchen ibuprofen (ADVIL) 800 MG tablet Take 800 mg by mouth 2 (two) times daily as needed.  . meclizine (ANTIVERT) 25 MG tablet Take 1 tablet (25 mg total) by mouth 3 (three) times daily as needed for dizziness.  . pregabalin (LYRICA) 200 MG capsule Take 200 mg by mouth 2 (two) times daily.  .  rizatriptan (MAXALT-MLT) 10 MG disintegrating tablet Take 1 tablet (10 mg total) by mouth as needed for migraine. Take 1 tab at onset of migraine.  May repeat in 2 hrs, if needed.  Max dose: 2 tabs/day. This is a 30 day prescription.  . sucralfate (CARAFATE) 1 G tablet Take 1 tablet (1 g total) by mouth 4 (four) times daily. (Patient taking differently: Take 1 g by mouth daily. )  . zonisamide (ZONEGRAN) 100 MG capsule Take 1 capsule (100 mg total) by mouth 2 (two) times daily.                     Objective:   Physical Exam     07/26/2019     135  12/04/2017         132   11/17/2017      130   11/03/17 129 lb 12.8 oz (58.9 kg)  08/20/17 131 lb (59.4 kg)  06/18/17 130 lb (59 kg)      amb bf nad  Vital signs reviewed - Note on arrival 02 sats  100% on RA     HEENT : pt wearing mask not removed for exam due to covid -19 concerns.    NECK :  without JVD/Nodes/TM/ nl carotid  upstrokes bilaterally   LUNGS: no acc muscle use,  Nl contour chest which is clear to A and P bilaterally without cough on insp or exp maneuvers   CV:  RRR  no s3 or murmur or increase in P2, and no edema   ABD:  soft and nontender with nl inspiratory excursion in the supine position. No bruits or organomegaly appreciated, bowel sounds nl  MS:  Nl gait/ ext warm without deformities, calf tenderness, cyanosis or clubbing No obvious joint restrictions   SKIN: warm and dry without lesions    NEURO:  alert, approp, nl sensorium with  no motor or cerebellar deficits apparent.       CXR PA and Lateral:   07/26/2019 :    I personally reviewed images and  impression as follows:    wnl          Assessment:

## 2019-07-26 NOTE — Patient Instructions (Addendum)
Keep the candy handy and use sugarless hard rock candy like jolly ranchers  Ok to see allergy take all your medications and let them know you had methacholine negative  For drainage / throat tickle stop the zyrtec  try take CHLORPHENIRAMINE  4 mg  (Chlortab 4mg   at [employer] should be easiest to find in the green box)  take one every 4 hours as needed - available over the counter- may cause drowsiness so start with two hour @ one hour prior to bedtime  and see how you tolerate it before trying in daytime up to every 4 hours if it doesn't make you too sleepy  Please remember to go to the  x-ray department  for your tests - we will call you with the results when they are available    We will call to schedule a barium swallow.   Pulmonary follow up is as needed but if need to return for any reason please bring all your medications

## 2019-07-27 ENCOUNTER — Encounter: Payer: Self-pay | Admitting: Internal Medicine

## 2019-07-27 NOTE — Assessment & Plan Note (Addendum)
Onset around 2015 -  MRI sinus  05/07/17 Sinuses/Orbits:  . Paranasal sinuses clear. No mastoid effusion. Inner ear structures normal. - FENO 11/03/2017  =   5 on dulera 200 2bid with no improvement in cough > d/c'd 11/03/2017  -  Allergy profile 11/03/2017 >  Eos 0.0 /  IgE  83 RAST pos cockroach only  - cyclical cough regimen [id number] > not adherent - FENO 11/17/2017  =    7 on no rx   - 12/04/2017 did not try 1st gen H1 > try chlorpheniramine and MCT done 12/15/17 neg for asthma - ENT eval 02/25/18 > neg [facility]/ Redmond Baseman re-eval 07/14/19 rec allergy eval/cxr/consier NF  - 07/26/2019 returned to pulmonary clinic s meds as reqeuested > cxr nl  - DgEs  07/27/2019 >>>   Strongly doubt allergy eval will be helpful here but advised to take all her meds with her (which she failed to do for me) so we all stay on the same page. She is better with hard rock candy with no evidence of asthma and not clear to me she has used 1st gen H1 blockers per guidelines so would first add that esp at hs when she has reproducible coughing fits nightly to see if this helps.  She has no evidence of sarcoid on cxr. A good rule of thumb is that >95% of pts with active sarcoid in any organ will have some plain cxr changes - on the other hand  if there are active pulmonary symptoms the cxr will look much worse than the patient:  No evidence of either scenario here/ strongly doubt active dz     rec >  Dg Es since apparently never done looking for evidence of gerd and if has it then formal GI eval as suggested by Dr Redmond Baseman can be pursued.   Discussed in detail all the  indications, usual  risks and alternatives  relative to the benefits with patient who agrees to proceed with w/u as outlined.   I had an extended discussion with the patient reviewing all relevant studies completed to date and  lasting 15 to 20 minutes of a 25 minute visit    Each maintenance medication was reviewed in detail including most importantly the difference between  maintenance and prns and under what circumstances the prns are to be triggered using an action plan format that is not reflected in the computer generated alphabetically organized AVS.     Please see AVS for specific instructions unique to this visit that I personally wrote and verbalized to the the pt in detail and then reviewed with pt  by my nurse highlighting any  changes in therapy recommended at today's visit to their plan of care.

## 2019-08-04 ENCOUNTER — Ambulatory Visit (HOSPITAL_COMMUNITY): Payer: Medicare Other

## 2019-08-11 DIAGNOSIS — M62838 Other muscle spasm: Secondary | ICD-10-CM | POA: Diagnosis not present

## 2019-08-11 DIAGNOSIS — G43909 Migraine, unspecified, not intractable, without status migrainosus: Secondary | ICD-10-CM | POA: Diagnosis not present

## 2019-08-11 DIAGNOSIS — Z23 Encounter for immunization: Secondary | ICD-10-CM | POA: Diagnosis not present

## 2019-08-12 ENCOUNTER — Other Ambulatory Visit: Payer: Self-pay

## 2019-08-12 ENCOUNTER — Ambulatory Visit (HOSPITAL_COMMUNITY)
Admission: RE | Admit: 2019-08-12 | Discharge: 2019-08-12 | Disposition: A | Discharge status: Home / Self Care | Payer: Medicare Other | Source: Ambulatory Visit | Attending: Internal Medicine | Admitting: Internal Medicine

## 2019-08-12 ENCOUNTER — Other Ambulatory Visit: Payer: Self-pay | Admitting: Internal Medicine

## 2019-08-12 DIAGNOSIS — R05 Cough: Secondary | ICD-10-CM | POA: Insufficient documentation

## 2019-08-12 DIAGNOSIS — R058 Other specified cough: Secondary | ICD-10-CM

## 2019-08-12 DIAGNOSIS — K449 Diaphragmatic hernia without obstruction or gangrene: Secondary | ICD-10-CM | POA: Diagnosis not present

## 2019-08-12 DIAGNOSIS — K224 Dyskinesia of esophagus: Secondary | ICD-10-CM | POA: Diagnosis not present

## 2019-08-12 DIAGNOSIS — K219 Gastro-esophageal reflux disease without esophagitis: Secondary | ICD-10-CM | POA: Diagnosis not present

## 2019-09-07 DIAGNOSIS — Z9189 Other specified personal risk factors, not elsewhere classified: Secondary | ICD-10-CM | POA: Diagnosis not present

## 2019-09-13 ENCOUNTER — Other Ambulatory Visit: Payer: Self-pay

## 2019-09-13 DIAGNOSIS — R05 Cough: Secondary | ICD-10-CM

## 2019-09-13 DIAGNOSIS — R058 Other specified cough: Secondary | ICD-10-CM

## 2019-09-13 MED ORDER — ESOMEPRAZOLE MAGNESIUM 40 MG PO CPDR: DELAYED_RELEASE_CAPSULE | ORAL | 0 refills | Status: AC

## 2019-10-04 DIAGNOSIS — M797 Fibromyalgia: Secondary | ICD-10-CM | POA: Diagnosis not present

## 2019-10-04 DIAGNOSIS — R5383 Other fatigue: Secondary | ICD-10-CM | POA: Diagnosis not present

## 2019-10-04 DIAGNOSIS — M2550 Pain in unspecified joint: Secondary | ICD-10-CM | POA: Diagnosis not present

## 2019-10-04 DIAGNOSIS — M79643 Pain in unspecified hand: Secondary | ICD-10-CM | POA: Diagnosis not present

## 2019-10-10 ENCOUNTER — Ambulatory Visit: Payer: Medicare Other | Attending: Internal Medicine

## 2019-10-10 DIAGNOSIS — Z20822 Contact with and (suspected) exposure to covid-19: Secondary | ICD-10-CM

## 2019-10-11 LAB — NOVEL CORONAVIRUS, NAA: SARS-CoV-2, NAA: NOT DETECTED

## 2019-12-08 DIAGNOSIS — R7309 Other abnormal glucose: Secondary | ICD-10-CM | POA: Diagnosis not present

## 2019-12-08 DIAGNOSIS — M797 Fibromyalgia: Secondary | ICD-10-CM | POA: Diagnosis not present

## 2019-12-08 DIAGNOSIS — E7800 Pure hypercholesterolemia, unspecified: Secondary | ICD-10-CM | POA: Diagnosis not present

## 2019-12-13 DIAGNOSIS — Z0000 Encounter for general adult medical examination without abnormal findings: Secondary | ICD-10-CM | POA: Diagnosis not present

## 2019-12-13 DIAGNOSIS — R0789 Other chest pain: Secondary | ICD-10-CM | POA: Diagnosis not present

## 2020-01-05 NOTE — Progress Notes (Signed)
Patient referred by Merrilee Seashore, MD for chest pain  Subjective:   Kerry Ortiz, female    DOB: 11/18/1965, 54 y.o.   MRN: [id number]   Chief Complaint  Patient presents with  . Chest Pain  . New Patient (Initial Visit)     HPI  54 y.o. African American female with fibromyalgia, chest pain.  Nonsmoker, nondiabetic, no family h/o early CAD. She has had retrosternal sharp chest pain lasting for seconds, unrelated to physical activity, sometimes radiating to left arm and fingers. No other associated symptoms noted.   She is on disability due to fibromyalgia.  Past Medical History:  Diagnosis Date  . Acid reflux   . Asthma   . Disturbance of skin sensation   . Fibromyalgia   . Migraine   . Uterine fibroid      Past Surgical History:  Procedure Laterality Date  . CARPAL TUNNEL RELEASE    . CYSTECTOMY    . HAND SURGERY Right   . SHOULDER SURGERY Right      Social History   Tobacco Use  Smoking Status Never Smoker  Smokeless Tobacco Never Used    Social History   Substance and Sexual Activity  Alcohol Use No     Family History  Problem Relation Age of Onset  . Hypertension Mother   . Arrhythmia Mother   . Thyroid disease Mother   . Pneumonia Father   . Diabetes Maternal Grandmother   . Kidney disease Sister   . Multiple sclerosis Cousin      Current Outpatient Medications on File Prior to Visit  Medication Sig Dispense Refill  . albuterol (PROVENTIL HFA;VENTOLIN HFA) 108 (90 BASE) MCG/ACT inhaler Inhale 2 puffs into the lungs every 6 (six) hours as needed for wheezing or shortness of breath. 1 Inhaler 2  . amitriptyline (ELAVIL) 25 MG tablet Take 1 tablet (25 mg total) by mouth at bedtime. 90 tablet 4  . cetirizine (ZYRTEC) 10 MG tablet Take 10 mg by mouth daily.    . citalopram (CELEXA) 10 MG tablet Take 1 tablet (10 mg total) by mouth daily. 30 tablet 6  . cyclobenzaprine (FLEXERIL) 10 MG tablet Take 1 tablet (10 mg total) by mouth 3  (three) times daily as needed for muscle spasms. 30 tablet 0  . DULoxetine (CYMBALTA) 60 MG capsule Take 1 capsule (60 mg total) by mouth daily. 90 capsule 3  . esomeprazole (NEXIUM) 40 MG capsule TAKE ONE CAPSULE BY MOUTH TWICE DAILY, 30 TO 60 MINUTES BEFORE FIRST AND LAST MEAL OF THE DAY 180 capsule 0  . fluticasone (FLONASE) 50 MCG/ACT nasal spray Place 2 sprays into both nostrils daily. 1 g 0  . hydroxychloroquine (PLAQUENIL) 200 MG tablet Take 400 mg by mouth daily.     Marland Kitchen ibuprofen (ADVIL) 800 MG tablet Take 800 mg by mouth 2 (two) times daily as needed.    . meclizine (ANTIVERT) 25 MG tablet Take 1 tablet (25 mg total) by mouth 3 (three) times daily as needed for dizziness. 30 tablet 0  . montelukast (SINGULAIR) 10 MG tablet montelukast 10 mg tablet    . pregabalin (LYRICA) 200 MG capsule Take 200 mg by mouth 2 (two) times daily.    . rizatriptan (MAXALT-MLT) 10 MG disintegrating tablet Take 1 tablet (10 mg total) by mouth as needed for migraine. Take 1 tab at onset of migraine.  May repeat in 2 hrs, if needed.  Max dose: 2 tabs/day. This is a 30 day prescription. 10  tablet 6  . sucralfate (CARAFATE) 1 G tablet Take 1 tablet (1 g total) by mouth 4 (four) times daily. (Patient taking differently: Take 1 g by mouth daily. ) 20 tablet 0  . zonisamide (ZONEGRAN) 100 MG capsule Take 1 capsule (100 mg total) by mouth 2 (two) times daily. 180 capsule 4  . Acetaminophen-Codeine 300-30 MG tablet Take 1 tablet by mouth daily as needed.    Marland Kitchen adapalene (DIFFERIN) 0.1 % gel adapalene 0.1 % topical gel    . diclofenac (VOLTAREN) 75 MG EC tablet Take 1 tablet by mouth daily.     No current facility-administered medications on file prior to visit.    Cardiovascular and other pertinent studies:  EKG 01/06/2020: Sinus rhythm 72 bpm RSR(V1) -nondiagnostic   Echocardiogram 05/08/2017: Left ventricle: The cavity size was normal. Systolic function wasnormal.  The estimated ejection fraction was in the  range of 60% to 65%.  Wall motion was normal; there were no regional wall motion abnormalities.  Left ventricular diastolic function parameters were normal.  Mitral valve: There was trivial regurgitation.    Carotid Duplex Study 05/08/2017 - The vertebral arteries appear patent with antegrade flow.  - Findings consistent with 1- 39 percent stenosis involving the right internal carotid artery and the left internal carotid artery.   Recent labs: 12/08/2019: Glucose 89, BUN/Cr 7/1.02. EGFR 73. Rest of the CMP normal Chol 171, TG 33, HDL 55, LDL 114 HbA1C 5.7% TSH 1.3 normal  06/07/2019: HbA1C 5.8%  03/16/2018: H/H 12.6/41. MCV 79.4. Platelets 361    Review of Systems  Cardiovascular: Positive for chest pain. Negative for dyspnea on exertion, leg swelling, palpitations and syncope.  Musculoskeletal: Positive for back pain.         Vitals:   01/06/20 1008  BP: 123/79  Pulse: 73  Resp: 17  SpO2: 97%     Body mass index is 25.06 kg/m. Filed Weights   01/06/20 1008  Weight: 137 lb (62.1 kg)     Objective:   Physical Exam  Constitutional: No distress.  Neck: No JVD present.  Cardiovascular: Normal rate, regular rhythm, normal heart sounds and intact distal pulses.  No murmur heard. Pulmonary/Chest: Effort normal and breath sounds normal. She has no wheezes. She has no rales.  Musculoskeletal:        General: No edema.  Nursing note and vitals reviewed.        Assessment & Recommendations:   54 y.o. African American female with fibromyalgia, chest pain.  Chest pain: Noncardiac. Will check treadmill stress test.  F/u as needed  Thank you for referring the patient to [place]. Please feel free to contact with any questions.  Nigel Mormon, MD [facility] Pager: [phone] Office: [phone]

## 2020-01-06 ENCOUNTER — Encounter: Payer: Self-pay | Admitting: Cardiology

## 2020-01-06 ENCOUNTER — Other Ambulatory Visit: Payer: Self-pay

## 2020-01-06 ENCOUNTER — Ambulatory Visit: Payer: Medicare Other | Admitting: Cardiology

## 2020-01-06 VITALS — BP 123/79 | HR 73 | Resp 17 | Ht 62.0 in | Wt 137.0 lb

## 2020-01-06 DIAGNOSIS — R0789 Other chest pain: Secondary | ICD-10-CM | POA: Diagnosis not present

## 2020-02-08 DIAGNOSIS — M2550 Pain in unspecified joint: Secondary | ICD-10-CM | POA: Diagnosis not present

## 2020-02-08 DIAGNOSIS — M797 Fibromyalgia: Secondary | ICD-10-CM | POA: Diagnosis not present

## 2020-02-08 DIAGNOSIS — R5383 Other fatigue: Secondary | ICD-10-CM | POA: Diagnosis not present

## 2020-02-08 DIAGNOSIS — M79643 Pain in unspecified hand: Secondary | ICD-10-CM | POA: Diagnosis not present

## 2020-02-13 ENCOUNTER — Ambulatory Visit: Payer: Medicare Other

## 2020-02-13 ENCOUNTER — Other Ambulatory Visit: Payer: Self-pay

## 2020-02-13 DIAGNOSIS — R0789 Other chest pain: Secondary | ICD-10-CM | POA: Diagnosis not present

## 2020-02-13 NOTE — Progress Notes (Signed)
Called and spoke with patient regarding her stress test results.

## 2020-02-13 NOTE — Progress Notes (Signed)
Normal stress test.  Regards, Dr. Virgina Jock

## 2020-02-23 DIAGNOSIS — H6983 Other specified disorders of Eustachian tube, bilateral: Secondary | ICD-10-CM | POA: Diagnosis not present

## 2020-03-22 ENCOUNTER — Other Ambulatory Visit: Payer: Self-pay

## 2020-03-22 ENCOUNTER — Ambulatory Visit (HOSPITAL_COMMUNITY)
Admission: EM | Admit: 2020-03-22 | Discharge: 2020-03-22 | Disposition: A | Discharge status: Home / Self Care | Payer: Medicare Other

## 2020-03-22 ENCOUNTER — Emergency Department (HOSPITAL_COMMUNITY)
Admission: EM | Admit: 2020-03-22 | Discharge: 2020-03-23 | Disposition: A | Discharge status: Home / Self Care | Payer: Medicare Other | Attending: Emergency Medicine | Admitting: Emergency Medicine

## 2020-03-22 ENCOUNTER — Encounter (HOSPITAL_COMMUNITY): Payer: Self-pay

## 2020-03-22 ENCOUNTER — Emergency Department (HOSPITAL_COMMUNITY): Payer: Medicare Other

## 2020-03-22 DIAGNOSIS — R42 Dizziness and giddiness: Secondary | ICD-10-CM | POA: Insufficient documentation

## 2020-03-22 DIAGNOSIS — R479 Unspecified speech disturbances: Secondary | ICD-10-CM | POA: Diagnosis not present

## 2020-03-22 DIAGNOSIS — R110 Nausea: Secondary | ICD-10-CM | POA: Diagnosis not present

## 2020-03-22 DIAGNOSIS — H9203 Otalgia, bilateral: Secondary | ICD-10-CM | POA: Diagnosis not present

## 2020-03-22 DIAGNOSIS — J45909 Unspecified asthma, uncomplicated: Secondary | ICD-10-CM | POA: Insufficient documentation

## 2020-03-22 DIAGNOSIS — Z7951 Long term (current) use of inhaled steroids: Secondary | ICD-10-CM | POA: Insufficient documentation

## 2020-03-22 DIAGNOSIS — Z79899 Other long term (current) drug therapy: Secondary | ICD-10-CM | POA: Diagnosis not present

## 2020-03-22 DIAGNOSIS — H9201 Otalgia, right ear: Secondary | ICD-10-CM

## 2020-03-22 DIAGNOSIS — M797 Fibromyalgia: Secondary | ICD-10-CM | POA: Diagnosis not present

## 2020-03-22 LAB — CBC
HCT: 44.3 % (ref 36.0–46.0)
Hemoglobin: 13.5 g/dL (ref 12.0–15.0)
MCH: 24.2 pg — ABNORMAL LOW (ref 26.0–34.0)
MCHC: 30.5 g/dL (ref 30.0–36.0)
MCV: 79.2 fL — ABNORMAL LOW (ref 80.0–100.0)
Platelets: 307 10*3/uL (ref 150–400)
RBC: 5.59 MIL/uL — ABNORMAL HIGH (ref 3.87–5.11)
RDW: 15.1 % (ref 11.5–15.5)
WBC: 4.3 10*3/uL (ref 4.0–10.5)
nRBC: 0 % (ref 0.0–0.2)

## 2020-03-22 LAB — DIFFERENTIAL
Abs Immature Granulocytes: 0 10*3/uL (ref 0.00–0.07)
Basophils Absolute: 0 10*3/uL (ref 0.0–0.1)
Basophils Relative: 0 %
Eosinophils Absolute: 0 10*3/uL (ref 0.0–0.5)
Eosinophils Relative: 0 %
Lymphocytes Relative: 43 %
Lymphs Abs: 1.8 10*3/uL (ref 0.7–4.0)
Monocytes Absolute: 0.2 10*3/uL (ref 0.1–1.0)
Monocytes Relative: 4 %
Neutro Abs: 2.3 10*3/uL (ref 1.7–7.7)
Neutrophils Relative %: 53 %
nRBC: 0 /100 WBC

## 2020-03-22 LAB — COMPREHENSIVE METABOLIC PANEL
ALT: 11 U/L (ref 0–44)
AST: 17 U/L (ref 15–41)
Albumin: 4 g/dL (ref 3.5–5.0)
Alkaline Phosphatase: 50 U/L (ref 38–126)
Anion gap: 11 (ref 5–15)
BUN: 12 mg/dL (ref 6–20)
CO2: 23 mmol/L (ref 22–32)
Calcium: 9.4 mg/dL (ref 8.9–10.3)
Chloride: 108 mmol/L (ref 98–111)
Creatinine, Ser: 0.95 mg/dL (ref 0.44–1.00)
GFR calc Af Amer: >60 mL/min (ref >60)
GFR calc non Af Amer: >60 mL/min (ref >60)
Glucose, Bld: 88 mg/dL (ref 70–99)
Potassium: 3.6 mmol/L (ref 3.5–5.1)
Sodium: 142 mmol/L (ref 135–145)
Total Bilirubin: 0.8 mg/dL (ref 0.3–1.2)
Total Protein: 7.2 g/dL (ref 6.5–8.1)

## 2020-03-22 LAB — I-STAT CHEM 8, ED
BUN: 15 mg/dL (ref 6–20)
Calcium, Ion: 1.2 mmol/L (ref 1.15–1.40)
Chloride: 108 mmol/L (ref 98–111)
Creatinine, Ser: 0.9 mg/dL (ref 0.44–1.00)
Glucose, Bld: 82 mg/dL (ref 70–99)
HCT: 44 % (ref 36.0–46.0)
Hemoglobin: 15 g/dL (ref 12.0–15.0)
Potassium: 4.3 mmol/L (ref 3.5–5.1)
Sodium: 142 mmol/L (ref 135–145)
TCO2: 24 mmol/L (ref 22–32)

## 2020-03-22 LAB — APTT: aPTT: 28 seconds (ref 24–36)

## 2020-03-22 LAB — PROTIME-INR
INR: 1 (ref 0.8–1.2)
Prothrombin Time: 12.2 seconds (ref 11.4–15.2)

## 2020-03-22 LAB — I-STAT BETA HCG BLOOD, ED (MC, WL, AP ONLY): I-stat hCG, quantitative: <5 m[IU]/mL (ref <5)

## 2020-03-22 NOTE — ED Triage Notes (Signed)
Pt here from urgent care for eval of dizziness, ear pain, nausea, and slurred speech/stuttering since Monday. Being treated for ear infection with ear drops.

## 2020-03-22 NOTE — Discharge Instructions (Addendum)
Give your symptoms, you need high level of evaluation in the emergency department. Our staff will help escort you there. It is very important you stay to be evaluated

## 2020-03-22 NOTE — ED Triage Notes (Signed)
Pt presents with bilateral ear pain, dizziness, and nausea since Monday.

## 2020-03-22 NOTE — ED Notes (Signed)
Patient is being discharged from the Urgent Care and sent to the Emergency Department via wheelchair . Per Roland Rack, PA, patient is in need of higher level of care due to slurred speech. Patient is aware and verbalizes understanding of plan of care.  Vitals:   03/22/20 1507  BP: 115/80  Pulse: 63  Resp: 18  Temp: 98.4 F (36.9 C)  SpO2: 100%

## 2020-03-22 NOTE — ED Provider Notes (Signed)
[place]    CSN: [id number] Arrival date & time: 03/22/20  1318      History   Chief Complaint Chief Complaint  Patient presents with  . Otalgia  . Dizziness  . Nausea    HPI Kerry Ortiz is a 54 y.o. female.   Patient presents to urgent care for dizziness with onset of 4 days ago.  She reports persistent dizziness since Monday morning.  Monday morning she woke with the profound dizziness, and was able to find words to talk.  She reports since then she has also had stuttering and stammering over her words.  She reports she was unable to move well Monday.  She reports feeling very off balance and wobbly to the right when walking.  she reports this is a completely new issue for her.  She also reports having a right-sided headache and frontal headache from time to time.  She reports she had been treated for an ear infection over the last few weeks with drops.  Denies hearing change or ringing in her ear.  Does report a history of vertigo that was worked up to rule out stroke initially and she was admitted to the hospital several years ago.  She reports this does feel somewhat similar, however has never had issues with her speech like this.  Denies any numbness tingling or weakness.  She does report her legs hurt a lot on Sunday before symptoms started.  Denies chest pain or shortness of breath.  No fevers or chills.     Past Medical History:  Diagnosis Date  . Acid reflux   . Asthma   . Disturbance of skin sensation   . Fibromyalgia   . Migraine   . Uterine fibroid     Patient Active Problem List   Diagnosis Date Noted  . Atypical chest pain 01/06/2020  . Diffuse pain 02/08/2019  . Right lumbar radiculopathy 02/08/2019  . Upper airway cough syndrome 11/03/2017  . Leg weakness 05/07/2017  . Hypokalemia 05/07/2017  . Anemia 05/07/2017  . Vertigo 05/07/2017  . Chronic migraine 04/23/2017  . Paresthesia 02/07/2016  . ABDOMINAL BLOATING 11/08/2008  . EPIGASTRIC  PAIN 11/08/2008  . DEPRESSION 10/31/2008  . OSTEOARTHRITIS 10/31/2008  . FIBROMYALGIA 10/31/2008  . HEADACHE, CHRONIC 10/31/2008  . ABDOMINAL PAIN, RECURRENT 10/31/2008  . CARPAL TUNNEL SYNDROME, HX OF 10/31/2008    Past Surgical History:  Procedure Laterality Date  . CARPAL TUNNEL RELEASE    . CYSTECTOMY    . HAND SURGERY Right   . SHOULDER SURGERY Right     OB History   No obstetric history on file.      Home Medications    Prior to Admission medications   Medication Sig Start Date End Date Taking? Authorizing Provider  Acetaminophen-Codeine 300-30 MG tablet Take 1 tablet by mouth daily as needed.    [provider]  adapalene (DIFFERIN) 0.1 % gel adapalene 0.1 % topical gel    [provider]  albuterol (PROVENTIL HFA;VENTOLIN HFA) 108 (90 BASE) MCG/ACT inhaler Inhale 2 puffs into the lungs every 6 (six) hours as needed for wheezing or shortness of breath. 10/24/14   Gregor Hams, MD  amitriptyline (ELAVIL) 25 MG tablet Take 1 tablet (25 mg total) by mouth at bedtime. 02/08/19   Marcial Pacas, MD  cetirizine (ZYRTEC) 10 MG tablet Take 10 mg by mouth daily.    [provider]  citalopram (CELEXA) 10 MG tablet Take 1 tablet (10 mg total) by  mouth daily. 05/03/15   Marcial Pacas, MD  cyclobenzaprine (FLEXERIL) 10 MG tablet Take 1 tablet (10 mg total) by mouth 3 (three) times daily as needed for muscle spasms. 00/45/99   Delora Fuel, MD  diclofenac (VOLTAREN) 75 MG EC tablet Take 1 tablet by mouth daily.    [provider]  DULoxetine (CYMBALTA) 60 MG capsule Take 1 capsule (60 mg total) by mouth daily. 04/20/19   Marcial Pacas, MD  esomeprazole (NEXIUM) 40 MG capsule TAKE ONE CAPSULE BY MOUTH TWICE DAILY, 30 TO 60 MINUTES BEFORE FIRST AND LAST MEAL OF THE DAY 09/13/19   Tanda Rockers, MD  fluticasone (FLONASE) 50 MCG/ACT nasal spray Place 2 sprays into both nostrils daily. 09/23/17   Tasia Catchings, Amy V, PA-C  hydroxychloroquine (PLAQUENIL) 200 MG tablet Take 400 mg  by mouth daily.     [provider]  ibuprofen (ADVIL) 800 MG tablet Take 800 mg by mouth 2 (two) times daily as needed.    [provider]  meclizine (ANTIVERT) 25 MG tablet Take 1 tablet (25 mg total) by mouth 3 (three) times daily as needed for dizziness. 05/09/17   Mikhail, Velta Addison, DO  montelukast (SINGULAIR) 10 MG tablet montelukast 10 mg tablet 04/28/19   [provider]  pregabalin (LYRICA) 200 MG capsule Take 200 mg by mouth 2 (two) times daily.    [provider]  rizatriptan (MAXALT-MLT) 10 MG disintegrating tablet Take 1 tablet (10 mg total) by mouth as needed for migraine. Take 1 tab at onset of migraine.  May repeat in 2 hrs, if needed.  Max dose: 2 tabs/day. This is a 30 day prescription. 02/08/19   Marcial Pacas, MD  sucralfate (CARAFATE) 1 G tablet Take 1 tablet (1 g total) by mouth 4 (four) times daily. Patient taking differently: Take 1 g by mouth daily.  07/11/12   Malvin Johns, MD  zonisamide (ZONEGRAN) 100 MG capsule Take 1 capsule (100 mg total) by mouth 2 (two) times daily. 02/08/19   Marcial Pacas, MD    Family History Family History  Problem Relation Age of Onset  . Hypertension Mother   . Arrhythmia Mother   . Thyroid disease Mother   . Pneumonia Father   . Diabetes Maternal Grandmother   . Kidney disease Sister   . Multiple sclerosis Cousin     Social History Social History   Tobacco Use  . Smoking status: Never Smoker  . Smokeless tobacco: Never Used  Vaping Use  . Vaping Use: Never used  Substance Use Topics  . Alcohol use: No  . Drug use: No     Allergies   Amoxicillin   Review of Systems Review of Systems   Physical Exam Triage Vital Signs ED Triage Vitals  Enc Vitals Group     BP 03/22/20 1507 115/80     Pulse Rate 03/22/20 1507 63     Resp 03/22/20 1507 18     Temp 03/22/20 1507 98.4 F (36.9 C)     Temp Source 03/22/20 1507 Oral     SpO2 03/22/20 1507 100 %     Weight --      Height --      Head  Circumference --      Peak Flow --      Pain Score 03/22/20 1505 7     Pain Loc --      Pain Edu? --      Excl. in [place]? --    No data found.  Updated Vital Signs BP 115/80 (BP Location: Right Arm)   Pulse 63   Temp 98.4 F (36.9 C) (Oral)   Resp 18   SpO2 100%   Visual Acuity Right Eye Distance:   Left Eye Distance:   Bilateral Distance:    Right Eye Near:   Left Eye Near:    Bilateral Near:     Physical Exam Vitals and nursing note reviewed.  Constitutional:      General: She is not in acute distress.    Appearance: She is well-developed. She is not ill-appearing.  HENT:     Head: Normocephalic and atraumatic.     Left Ear: Tympanic membrane normal.     Ears:     Comments: Right ear with tenderness.  There is some white discharge in the canal.    Nose: Nose normal.     Mouth/Throat:     Mouth: Mucous membranes are moist.  Eyes:     Extraocular Movements: Extraocular movements intact.     Conjunctiva/sclera: Conjunctivae normal.     Pupils: Pupils are equal, round, and reactive to light.     Comments: No significant nystagmus appreciated  Cardiovascular:     Rate and Rhythm: Normal rate and regular rhythm.     Heart sounds: No murmur heard.   Pulmonary:     Effort: Pulmonary effort is normal. No respiratory distress.     Breath sounds: Normal breath sounds.  Abdominal:     Palpations: Abdomen is soft.     Tenderness: There is no abdominal tenderness.  Musculoskeletal:     Cervical back: Neck supple.  Skin:    General: Skin is warm and dry.  Neurological:     General: No focal deficit present.     Mental Status: She is alert and oriented to person, place, and time.     Sensory: No sensory deficit.     Comments: No facial droop.  Speech slow on occasion with some hesitance otherwise fluent.  Cranial nerves grossly intact to confrontation.  Finger-to-nose grossly intact.  Some hesitance with heel-to-shin.  Dizziness reported with banding and closed  eyes.  No pronator drift.  Strength 5/5 grossly in all limbs.      UC Treatments / Results  Labs (all labs ordered are listed, but only abnormal results are displayed) Labs Reviewed - No data to display  EKG   Radiology No results found.  Procedures Procedures (including critical care time)  Medications Ordered in UC Medications - No data to display  Initial Impression / Assessment and Plan / UC Course  I have reviewed the triage vital signs and the nursing notes.  Pertinent labs & imaging results that were available during my care of the patient were reviewed by me and considered in my medical decision making (see chart for details).     #Dizziness #Speech disturbance #Right ear pain Patient is a 54 year old presenting with persistent dizziness and recent speech disturbances.  Primary concern would be for posterior circulation or vestibular stroke.  Discussed with patient that cannot rule out more serious neurologic concerns based on her presenting symptoms in the urgent care and that she needs further evaluation and higher level of care in the emergency department.  Patient had self transported here today, she has stable vital signs and feel she can be assisted to [facility] emergency department by clinical staff.  Discussed with patient that is important she be evaluated by provider in the emergency department.  Verbalized understanding plan of care.  Final Clinical Impressions(s) / UC Diagnoses   Final diagnoses:  Dizziness  Speech disturbance, unspecified type  Right ear pain     Discharge Instructions     Give your symptoms, you need high level of evaluation in the emergency department. Our staff will help escort you there. It is very important you stay to be evaluated      ED Prescriptions    None     PDMP not reviewed this encounter.   Purnell Shoemaker, PA-C 03/22/20 1557

## 2020-03-23 ENCOUNTER — Encounter (HOSPITAL_COMMUNITY): Payer: Self-pay | Admitting: Emergency Medicine

## 2020-03-23 DIAGNOSIS — R42 Dizziness and giddiness: Secondary | ICD-10-CM | POA: Diagnosis not present

## 2020-03-23 MED ORDER — MECLIZINE HCL 25 MG PO TABS: 25.0000 mg | ORAL_TABLET | Freq: Three times a day (TID) | ORAL | 0 refills | Status: AC | PRN

## 2020-03-23 MED ORDER — MECLIZINE HCL 25 MG PO TABS
12.5000 mg | ORAL_TABLET | Freq: Once | ORAL | Status: AC
  Administered 2020-03-23: 12.5 mg via ORAL
  Filled 2020-03-23: qty 1

## 2020-03-23 NOTE — ED Notes (Signed)
Pt verbalized understanding of d/c instructions, follow up and medications. Pt ambulatory to [place] with steady gait.

## 2020-03-23 NOTE — ED Notes (Signed)
Pt ambulatory to [place] with steady gait

## 2020-03-23 NOTE — ED Provider Notes (Signed)
[facility] EMERGENCY DEPARTMENT Provider Note   CSN: [id number] Arrival date & time: 03/22/20  1616     History No chief complaint on file.   Kerry Ortiz is a 54 y.o. female.  The history is provided by the patient.  Dizziness Quality:  Head spinning Severity:  Severe Onset quality:  Sudden Timing:  Constant Progression:  Waxing and waning Chronicity:  Recurrent Context: bending over, head movement and standing up   Relieved by:  Nothing Worsened by:  Nothing Ineffective treatments:  None tried Associated symptoms: nausea   Associated symptoms: no blood in stool, no chest pain, no shortness of breath, no tinnitus and no weakness   Associated symptoms comment:  Ear pain  Risk factors: hx of vertigo   Risk factors: no hx of stroke   Otalgia Location:  Bilateral Behind ear:  No abnormality Quality:  Aching Severity:  Moderate Onset quality:  Gradual Timing:  Constant Progression:  Worsening (Only in the Right ear ) Chronicity:  New Context: not direct blow   Relieved by:  Nothing Worsened by:  Nothing Ineffective treatments:  None tried Associated symptoms: no abdominal pain, no rash and no tinnitus   Risk factors: no recent travel   Patient with vertigo      Past Medical History:  Diagnosis Date  . Acid reflux   . Asthma   . Disturbance of skin sensation   . Fibromyalgia   . Migraine   . Uterine fibroid     Patient Active Problem List   Diagnosis Date Noted  . Atypical chest pain 01/06/2020  . Diffuse pain 02/08/2019  . Right lumbar radiculopathy 02/08/2019  . Upper airway cough syndrome 11/03/2017  . Leg weakness 05/07/2017  . Hypokalemia 05/07/2017  . Anemia 05/07/2017  . Vertigo 05/07/2017  . Chronic migraine 04/23/2017  . Paresthesia 02/07/2016  . ABDOMINAL BLOATING 11/08/2008  . EPIGASTRIC PAIN 11/08/2008  . DEPRESSION 10/31/2008  . OSTEOARTHRITIS 10/31/2008  . FIBROMYALGIA 10/31/2008  . HEADACHE, CHRONIC 10/31/2008    . ABDOMINAL PAIN, RECURRENT 10/31/2008  . CARPAL TUNNEL SYNDROME, HX OF 10/31/2008    Past Surgical History:  Procedure Laterality Date  . CARPAL TUNNEL RELEASE    . CYSTECTOMY    . HAND SURGERY Right   . SHOULDER SURGERY Right      OB History   No obstetric history on file.     Family History  Problem Relation Age of Onset  . Hypertension Mother   . Arrhythmia Mother   . Thyroid disease Mother   . Pneumonia Father   . Diabetes Maternal Grandmother   . Kidney disease Sister   . Multiple sclerosis Cousin     Social History   Tobacco Use  . Smoking status: Never Smoker  . Smokeless tobacco: Never Used  Vaping Use  . Vaping Use: Never used  Substance Use Topics  . Alcohol use: No  . Drug use: No    Home Medications Prior to Admission medications   Medication Sig Start Date End Date Taking? Authorizing Provider  Acetaminophen-Codeine 300-30 MG tablet Take 1 tablet by mouth daily as needed.    [provider]  adapalene (DIFFERIN) 0.1 % gel adapalene 0.1 % topical gel    [provider]  albuterol (PROVENTIL HFA;VENTOLIN HFA) 108 (90 BASE) MCG/ACT inhaler Inhale 2 puffs into the lungs every 6 (six) hours as needed for wheezing or shortness of breath. 10/24/14   Gregor Hams, MD  amitriptyline (ELAVIL) 25 MG tablet  Take 1 tablet (25 mg total) by mouth at bedtime. 02/08/19   Marcial Pacas, MD  cetirizine (ZYRTEC) 10 MG tablet Take 10 mg by mouth daily.    [provider]  citalopram (CELEXA) 10 MG tablet Take 1 tablet (10 mg total) by mouth daily. 05/03/15   Marcial Pacas, MD  cyclobenzaprine (FLEXERIL) 10 MG tablet Take 1 tablet (10 mg total) by mouth 3 (three) times daily as needed for muscle spasms. 42/35/36   Delora Fuel, MD  diclofenac (VOLTAREN) 75 MG EC tablet Take 1 tablet by mouth daily.    [provider]  DULoxetine (CYMBALTA) 60 MG capsule Take 1 capsule (60 mg total) by mouth daily. 04/20/19   Marcial Pacas, MD  esomeprazole (NEXIUM)  40 MG capsule TAKE ONE CAPSULE BY MOUTH TWICE DAILY, 30 TO 60 MINUTES BEFORE FIRST AND LAST MEAL OF THE DAY 09/13/19   Tanda Rockers, MD  fluticasone (FLONASE) 50 MCG/ACT nasal spray Place 2 sprays into both nostrils daily. 09/23/17   Tasia Catchings, Amy V, PA-C  hydroxychloroquine (PLAQUENIL) 200 MG tablet Take 400 mg by mouth daily.     [provider]  ibuprofen (ADVIL) 800 MG tablet Take 800 mg by mouth 2 (two) times daily as needed.    [provider]  meclizine (ANTIVERT) 25 MG tablet Take 1 tablet (25 mg total) by mouth 3 (three) times daily as needed for dizziness. 03/23/20   Maysoon Lozada, MD  montelukast (SINGULAIR) 10 MG tablet montelukast 10 mg tablet 04/28/19   [provider]  pregabalin (LYRICA) 200 MG capsule Take 200 mg by mouth 2 (two) times daily.    [provider]  rizatriptan (MAXALT-MLT) 10 MG disintegrating tablet Take 1 tablet (10 mg total) by mouth as needed for migraine. Take 1 tab at onset of migraine.  May repeat in 2 hrs, if needed.  Max dose: 2 tabs/day. This is a 30 day prescription. 02/08/19   Marcial Pacas, MD  sucralfate (CARAFATE) 1 G tablet Take 1 tablet (1 g total) by mouth 4 (four) times daily. Patient taking differently: Take 1 g by mouth daily.  07/11/12   Malvin Johns, MD  zonisamide (ZONEGRAN) 100 MG capsule Take 1 capsule (100 mg total) by mouth 2 (two) times daily. 02/08/19   Marcial Pacas, MD    Allergies    Amoxicillin  Review of Systems   Review of Systems  Constitutional: Negative for unexpected weight change.  HENT: Positive for ear pain. Negative for tinnitus.   Eyes: Negative for visual disturbance.  Respiratory: Negative for shortness of breath.   Cardiovascular: Negative for chest pain.  Gastrointestinal: Positive for nausea. Negative for abdominal pain and blood in stool.  Genitourinary: Negative for difficulty urinating.  Musculoskeletal: Negative for arthralgias.  Skin: Negative for rash.  Neurological: Positive for  dizziness. Negative for weakness and numbness.  Psychiatric/Behavioral: Negative for agitation.  All other systems reviewed and are negative.   Physical Exam Updated Vital Signs BP 111/77   Pulse 63   Temp 98 F (36.7 C) (Oral)   Resp 16   Ht 5\' 2"  (1.575 m)   Wt 61.2 kg   SpO2 94%   BMI 24.69 kg/m   Physical Exam Vitals and nursing note reviewed.  Constitutional:      General: She is not in acute distress.    Appearance: Normal appearance.  HENT:     Head: Normocephalic and atraumatic.     Left Ear: Tympanic membrane normal.     Ears:  Comments: R TM is white and sclerotic     Nose: Nose normal.     Mouth/Throat:     Mouth: Mucous membranes are moist.     Pharynx: Oropharynx is clear.  Eyes:     Extraocular Movements: Extraocular movements intact.     Conjunctiva/sclera: Conjunctivae normal.     Pupils: Pupils are equal, round, and reactive to light.  Cardiovascular:     Rate and Rhythm: Normal rate and regular rhythm.     Pulses: Normal pulses.     Heart sounds: Normal heart sounds.  Pulmonary:     Effort: Pulmonary effort is normal.     Breath sounds: Normal breath sounds.  Abdominal:     General: Abdomen is flat. Bowel sounds are normal.     Tenderness: There is no abdominal tenderness. There is no guarding.  Musculoskeletal:        General: Normal range of motion.     Cervical back: Normal range of motion and neck supple.  Skin:    General: Skin is warm and dry.     Capillary Refill: Capillary refill takes less than 2 seconds.  Neurological:     General: No focal deficit present.     Mental Status: She is alert and oriented to person, place, and time.     Cranial Nerves: No cranial nerve deficit.     Sensory: No sensory deficit.     Motor: No weakness.     Gait: Gait normal.     Deep Tendon Reflexes: Reflexes normal.     Comments: No stuttering no word finding abnormality   Psychiatric:        Mood and Affect: Mood normal.        Behavior:  Behavior normal.     ED Results / Procedures / Treatments   Labs (all labs ordered are listed, but only abnormal results are displayed) Results for orders placed or performed during the hospital encounter of 03/22/20  Protime-INR  Result Value Ref Range   Prothrombin Time 12.2 11.4 - 15.2 seconds   INR 1.0 0.8 - 1.2  APTT  Result Value Ref Range   aPTT 28 24 - 36 seconds  CBC  Result Value Ref Range   WBC 4.3 4.0 - 10.5 K/uL   RBC 5.59 (H) 3.87 - 5.11 MIL/uL   Hemoglobin 13.5 12.0 - 15.0 g/dL   HCT 44.3 36 - 46 %   MCV 79.2 (L) 80.0 - 100.0 fL   MCH 24.2 (L) 26.0 - 34.0 pg   MCHC 30.5 30.0 - 36.0 g/dL   RDW 15.1 11.5 - 15.5 %   Platelets 307 150 - 400 K/uL   nRBC 0.0 0.0 - 0.2 %  Differential  Result Value Ref Range   Neutrophils Relative % 53 %   Neutro Abs 2.3 1.7 - 7.7 K/uL   Lymphocytes Relative 43 %   Lymphs Abs 1.8 0.7 - 4.0 K/uL   Monocytes Relative 4 %   Monocytes Absolute 0.2 0 - 1 K/uL   Eosinophils Relative 0 %   Eosinophils Absolute 0.0 0 - 0 K/uL   Basophils Relative 0 %   Basophils Absolute 0.0 0 - 0 K/uL   nRBC 0 0 /100 WBC   Abs Immature Granulocytes 0.00 0.00 - 0.07 K/uL   Ovalocytes PRESENT   Comprehensive metabolic panel  Result Value Ref Range   Sodium 142 135 - 145 mmol/L   Potassium 3.6 3.5 - 5.1 mmol/L   Chloride 108 98 -  111 mmol/L   CO2 23 22 - 32 mmol/L   Glucose, Bld 88 70 - 99 mg/dL   BUN 12 6 - 20 mg/dL   Creatinine, Ser 0.95 0.44 - 1.00 mg/dL   Calcium 9.4 8.9 - 10.3 mg/dL   Total Protein 7.2 6.5 - 8.1 g/dL   Albumin 4.0 3.5 - 5.0 g/dL   AST 17 15 - 41 U/L   ALT 11 0 - 44 U/L   Alkaline Phosphatase 50 38 - 126 U/L   Total Bilirubin 0.8 0.3 - 1.2 mg/dL   GFR calc non Af Amer >60 >60 mL/min   GFR calc Af Amer >60 >60 mL/min   Anion gap 11 5 - 15  I-stat chem 8, ED  Result Value Ref Range   Sodium 142 135 - 145 mmol/L   Potassium 4.3 3.5 - 5.1 mmol/L   Chloride 108 98 - 111 mmol/L   BUN 15 6 - 20 mg/dL   Creatinine, Ser  0.90 0.44 - 1.00 mg/dL   Glucose, Bld 82 70 - 99 mg/dL   Calcium, Ion 1.20 1.15 - 1.40 mmol/L   TCO2 24 22 - 32 mmol/L   Hemoglobin 15.0 12.0 - 15.0 g/dL   HCT 44.0 36 - 46 %  I-Stat beta hCG blood, ED  Result Value Ref Range   I-stat hCG, quantitative <5.0 <5 mIU/mL   Comment 3           CT HEAD WO CONTRAST  Result Date: 03/22/2020 CLINICAL DATA:  Dizziness. EXAM: CT HEAD WITHOUT CONTRAST TECHNIQUE: Contiguous axial images were obtained from the base of the skull through the vertex without intravenous contrast. COMPARISON:  May 07, 2017 FINDINGS: Brain: No evidence of acute infarction, hemorrhage, hydrocephalus, extra-axial collection or mass lesion/mass effect. Vascular: No hyperdense vessel or unexpected calcification. Skull: Normal. Negative for fracture or focal lesion. Sinuses/Orbits: No acute finding. Other: None. IMPRESSION: No acute intracranial pathology. Electronically Signed   By: Virgina Norfolk M.D.   On: 03/22/2020 19:27    EKG EKG Interpretation  Date/Time:  Thursday March 22 2020 16:17:11 EDT Ventricular Rate:  73 PR Interval:  156 QRS Duration: 94 QT Interval:  410 QTC Calculation: 451 R Axis:   46 Text Interpretation: Normal sinus rhythm Normal ECG NO STEMI. Confirmed by Addison Lank [phone]) on 03/22/2020 11:54:40 PM   Radiology CT HEAD WO CONTRAST  Result Date: 03/22/2020 CLINICAL DATA:  Dizziness. EXAM: CT HEAD WITHOUT CONTRAST TECHNIQUE: Contiguous axial images were obtained from the base of the skull through the vertex without intravenous contrast. COMPARISON:  May 07, 2017 FINDINGS: Brain: No evidence of acute infarction, hemorrhage, hydrocephalus, extra-axial collection or mass lesion/mass effect. Vascular: No hyperdense vessel or unexpected calcification. Skull: Normal. Negative for fracture or focal lesion. Sinuses/Orbits: No acute finding. Other: None. IMPRESSION: No acute intracranial pathology. Electronically Signed   By: Virgina Norfolk M.D.    On: 03/22/2020 19:27    Procedures Procedures (including critical care time)  Medications Ordered in ED Medications  meclizine (ANTIVERT) tablet 12.5 mg (has no administration in time range)    ED Course  I have reviewed the triage vital signs and the nursing notes.  Pertinent labs & imaging results that were available during my care of the patient were reviewed by me and considered in my medical decision making (see chart for details).    130 case d/w Dr. Lorrin Goodell.  Given symptoms ongoing since Monday with no findings on head CT no indication for MRI as we would  have seen something on CT.  Follow up with ENT as an outpatient.     Patient ambulated in the department without difficulty.     Symptoms are positional in nature, there are no optic abnormalities,  Patient has ear pain and nidus for the vertigo.  The head CT is normal at day 5-7 and there should be findings of stroke on CT. Continue ear drops and follow up with ENT on Monday as scheduled.  I have refilled meclizine.    Kerry Ortiz was evaluated in Emergency Department on 03/23/2020 for the symptoms described in the history of present illness. She was evaluated in the context of the global COVID-19 pandemic, which necessitated consideration that the patient might be at risk for infection with the SARS-CoV-2 virus that causes COVID-19. Institutional protocols and algorithms that pertain to the evaluation of patients at risk for COVID-19 are in a state of rapid change based on information released by regulatory bodies including the CDC and federal and state organizations. These policies and algorithms were followed during the patient's care in the ED.  Final Clinical Impression(s) / ED Diagnoses Return for intractable cough, coughing up blood,fevers >100.4 unrelieved by medication, shortness of breath, intractable vomiting, chest pain, shortness of breath, weakness,numbness, changes in speech, facial asymmetry,abdominal  pain, passing out,Inability to tolerate liquids or food, cough, altered mental status or any concerns. No signs of systemic illness or infection. The patient is nontoxic-appearing on exam and vital signs are within normal limits.   I have reviewed the triage vital signs and the nursing notes. Pertinent labs &imaging results that were available during my care of the patient were reviewed by me and considered in my medical decision making (see chart for details).After history, exam, and medical workup I feel the patient has beenappropriately medically screened and is safe for discharge home. Pertinent diagnoses were discussed with the patient. Patient was given return precautions.    Rx / DC Orders ED Discharge Orders         Ordered    meclizine (ANTIVERT) 25 MG tablet  3 times daily PRN        03/23/20 0105           Inman Fettig, MD 03/23/20 6438

## 2020-03-26 DIAGNOSIS — M26621 Arthralgia of right temporomandibular joint: Secondary | ICD-10-CM | POA: Diagnosis not present

## 2020-03-26 DIAGNOSIS — Z1231 Encounter for screening mammogram for malignant neoplasm of breast: Secondary | ICD-10-CM | POA: Diagnosis not present

## 2020-03-26 DIAGNOSIS — H9201 Otalgia, right ear: Secondary | ICD-10-CM | POA: Diagnosis not present

## 2020-04-08 ENCOUNTER — Other Ambulatory Visit: Payer: Self-pay | Admitting: Internal Medicine

## 2020-04-08 ENCOUNTER — Other Ambulatory Visit: Payer: Self-pay | Admitting: Neurology

## 2020-04-08 DIAGNOSIS — R058 Other specified cough: Secondary | ICD-10-CM

## 2020-04-13 ENCOUNTER — Other Ambulatory Visit: Payer: Self-pay

## 2020-04-13 ENCOUNTER — Ambulatory Visit (HOSPITAL_COMMUNITY)
Admission: EM | Admit: 2020-04-13 | Discharge: 2020-04-13 | Disposition: A | Discharge status: Home / Self Care | Payer: Medicare Other | Attending: Urgent Care | Admitting: Urgent Care

## 2020-04-13 ENCOUNTER — Encounter (HOSPITAL_COMMUNITY): Payer: Self-pay

## 2020-04-13 DIAGNOSIS — R519 Headache, unspecified: Secondary | ICD-10-CM | POA: Insufficient documentation

## 2020-04-13 DIAGNOSIS — J45909 Unspecified asthma, uncomplicated: Secondary | ICD-10-CM | POA: Diagnosis not present

## 2020-04-13 DIAGNOSIS — Z20822 Contact with and (suspected) exposure to covid-19: Secondary | ICD-10-CM | POA: Diagnosis not present

## 2020-04-13 DIAGNOSIS — M797 Fibromyalgia: Secondary | ICD-10-CM | POA: Insufficient documentation

## 2020-04-13 DIAGNOSIS — K219 Gastro-esophageal reflux disease without esophagitis: Secondary | ICD-10-CM | POA: Insufficient documentation

## 2020-04-13 DIAGNOSIS — Z79899 Other long term (current) drug therapy: Secondary | ICD-10-CM | POA: Insufficient documentation

## 2020-04-13 DIAGNOSIS — R52 Pain, unspecified: Secondary | ICD-10-CM | POA: Insufficient documentation

## 2020-04-13 MED ORDER — KETOROLAC TROMETHAMINE 60 MG/2ML IM SOLN
INTRAMUSCULAR | Status: AC
  Filled 2020-04-13: qty 2

## 2020-04-13 MED ORDER — KETOROLAC TROMETHAMINE 60 MG/2ML IM SOLN
60.0000 mg | Freq: Once | INTRAMUSCULAR | Status: AC
  Administered 2020-04-13: 60 mg via INTRAMUSCULAR

## 2020-04-13 NOTE — Discharge Instructions (Signed)

## 2020-04-13 NOTE — ED Provider Notes (Signed)
[place]   MRN: [id number] DOB: 1965/11/09  Subjective:   Kerry Ortiz is a 54 y.o. female presenting for 2 to 3-day history of acute onset persistent posterior neck pain that radiates up to her back of her head, now having body aches.  Patient has been using her migraine medication zonisamide and rizatriptan with minimal relief.  She has been Covid vaccinated but would like to make sure that she does not have this.  Denies confusion, weakness, numbness or tingling, cough, chest pain, shortness of breath, belly pain, dysuria, urinary frequency, hematuria, incontinence, inability to urinate or defecate.  No current facility-administered medications for this encounter.  Current Outpatient Medications:    albuterol (PROVENTIL HFA;VENTOLIN HFA) 108 (90 BASE) MCG/ACT inhaler, Inhale 2 puffs into the lungs every 6 (six) hours as needed for wheezing or shortness of breath., Disp: 1 Inhaler, Rfl: 2   amitriptyline (ELAVIL) 25 MG tablet, Take 1 tablet (25 mg total) by mouth at bedtime., Disp: 90 tablet, Rfl: 4   cetirizine (ZYRTEC) 10 MG tablet, Take 10 mg by mouth daily., Disp: , Rfl:    citalopram (CELEXA) 10 MG tablet, Take 1 tablet (10 mg total) by mouth daily., Disp: 30 tablet, Rfl: 6   cyclobenzaprine (FLEXERIL) 10 MG tablet, Take 1 tablet (10 mg total) by mouth 3 (three) times daily as needed for muscle spasms. (Patient not taking: Reported on 03/23/2020), Disp: 30 tablet, Rfl: 0   DULoxetine (CYMBALTA) 60 MG capsule, Take 1 capsule (60 mg total) by mouth daily. (Patient not taking: Reported on 03/23/2020), Disp: 90 capsule, Rfl: 3   esomeprazole (NEXIUM) 40 MG capsule, TAKE ONE CAPSULE BY MOUTH TWICE DAILY, 30 TO 60 MINUTES BEFORE FIRST AND LAST MEAL OF THE DAY (Patient taking differently: Take 40 mg by mouth 2 (two) times daily before a meal. ), Disp: 180 capsule, Rfl: 0   fluticasone (FLONASE) 50 MCG/ACT nasal spray, Place 2 sprays into both nostrils daily., Disp: 1  g, Rfl: 0   hydroxychloroquine (PLAQUENIL) 200 MG tablet, Take 400 mg by mouth daily. , Disp: , Rfl:    ibuprofen (ADVIL) 800 MG tablet, Take 800 mg by mouth every 8 (eight) hours as needed for mild pain., Disp: , Rfl:    meclizine (ANTIVERT) 25 MG tablet, Take 1 tablet (25 mg total) by mouth 3 (three) times daily as needed for dizziness., Disp: 12 tablet, Rfl: 0   montelukast (SINGULAIR) 10 MG tablet, Take 10 mg by mouth at bedtime. , Disp: , Rfl:    pregabalin (LYRICA) 200 MG capsule, Take 200 mg by mouth 2 (two) times daily., Disp: , Rfl:    rizatriptan (MAXALT-MLT) 10 MG disintegrating tablet, Take 1 tab at onset of migraine.  May repeat in 2 hrs, if needed.  Max dose: 2 tabs/day. This is a 30 day prescription. Pls call [phone] to schedule appt to continue refills or request from PCP., Disp: 10 tablet, Rfl: 2   sucralfate (CARAFATE) 1 G tablet, Take 1 tablet (1 g total) by mouth 4 (four) times daily. (Patient taking differently: Take 1 g by mouth daily. ), Disp: 20 tablet, Rfl: 0   zonisamide (ZONEGRAN) 100 MG capsule, Take 1 capsule (100 mg total) by mouth 2 (two) times daily. Pls call [phone] to schedule appt to continue refills or request from PCP., Disp: 180 capsule, Rfl: 0   Allergies  Allergen Reactions   Amoxicillin     Yeast infections    Past Medical History:  Diagnosis  Date   Acid reflux    Asthma    Disturbance of skin sensation    Fibromyalgia    Migraine    Uterine fibroid      Past Surgical History:  Procedure Laterality Date   CARPAL TUNNEL RELEASE     CYSTECTOMY     HAND SURGERY Right    SHOULDER SURGERY Right     Family History  Problem Relation Age of Onset   Hypertension Mother    Arrhythmia Mother    Thyroid disease Mother    Pneumonia Father    Diabetes Maternal Grandmother    Kidney disease Sister    Multiple sclerosis Cousin     Social History   Tobacco Use   Smoking status: Never Smoker   Smokeless tobacco:  Never Used  Vaping Use   Vaping Use: Never used  Substance Use Topics   Alcohol use: No   Drug use: No    ROS   Objective:   Vitals: BP 112/68 (BP Location: Right Arm)    Pulse 76    Temp 98.6 F (37 C) (Oral)    Resp 16    SpO2 99%   Physical Exam Constitutional:      General: She is not in acute distress.    Appearance: Normal appearance. She is well-developed. She is not ill-appearing, toxic-appearing or diaphoretic.  HENT:     Head: Normocephalic and atraumatic.     Nose: Nose normal.     Mouth/Throat:     Mouth: Mucous membranes are moist.  Eyes:     Extraocular Movements: Extraocular movements intact.     Pupils: Pupils are equal, round, and reactive to light.  Cardiovascular:     Rate and Rhythm: Normal rate and regular rhythm.     Pulses: Normal pulses.     Heart sounds: Normal heart sounds. No murmur heard.  No friction rub. No gallop.   Pulmonary:     Effort: Pulmonary effort is normal. No respiratory distress.     Breath sounds: Normal breath sounds. No stridor. No wheezing, rhonchi or rales.  Skin:    General: Skin is warm and dry.     Findings: No rash.  Neurological:     Mental Status: She is alert and oriented to person, place, and time.     Cranial Nerves: No cranial nerve deficit.     Motor: No weakness.     Coordination: Romberg sign negative. Coordination normal.     Gait: Gait normal.     Deep Tendon Reflexes: Reflexes normal.  Psychiatric:        Mood and Affect: Mood normal. Mood is not anxious or depressed.        Speech: Speech normal.        Behavior: Behavior normal. Behavior is not agitated.        Thought Content: Thought content normal.        Cognition and Memory: Cognition is not impaired. Memory is not impaired.       Assessment and Plan :   PDMP not reviewed this encounter.  1. Bad headache   2. Body aches     Suspect more of a tension headache with body aches.  Recommended COVID-19 testing and patient would like to  do this as well.  IM Toradol given in clinic for her severe headache.  No signs of an acute intracranial process.  Recommend supportive care otherwise. Counseled patient on potential for adverse effects with medications prescribed/recommended today, ER and  return-to-clinic precautions discussed, patient verbalized understanding.    Jaynee Eagles, PA-C 04/13/20 2013

## 2020-04-13 NOTE — ED Triage Notes (Signed)
Patient presents to Urgent Care with complaints of intermittent posterior headache since a few days ago. Patient reports she has been taking a prescription medication for her migraine but it has not been helping.

## 2020-04-14 LAB — SARS CORONAVIRUS 2 (TAT 6-24 HRS): SARS Coronavirus 2: NEGATIVE

## 2020-04-24 DIAGNOSIS — N390 Urinary tract infection, site not specified: Secondary | ICD-10-CM | POA: Diagnosis not present

## 2020-04-24 DIAGNOSIS — R350 Frequency of micturition: Secondary | ICD-10-CM | POA: Diagnosis not present

## 2020-04-24 DIAGNOSIS — G44209 Tension-type headache, unspecified, not intractable: Secondary | ICD-10-CM | POA: Diagnosis not present

## 2020-04-24 DIAGNOSIS — M791 Myalgia, unspecified site: Secondary | ICD-10-CM | POA: Diagnosis not present

## 2020-04-24 DIAGNOSIS — R072 Precordial pain: Secondary | ICD-10-CM | POA: Diagnosis not present

## 2020-05-02 DIAGNOSIS — M797 Fibromyalgia: Secondary | ICD-10-CM | POA: Diagnosis not present

## 2020-05-02 DIAGNOSIS — G43909 Migraine, unspecified, not intractable, without status migrainosus: Secondary | ICD-10-CM | POA: Diagnosis not present

## 2020-05-24 DIAGNOSIS — H6121 Impacted cerumen, right ear: Secondary | ICD-10-CM | POA: Diagnosis not present

## 2020-05-24 DIAGNOSIS — Z8669 Personal history of other diseases of the nervous system and sense organs: Secondary | ICD-10-CM | POA: Diagnosis not present

## 2020-06-06 ENCOUNTER — Other Ambulatory Visit: Payer: Self-pay | Admitting: Gastroenterology

## 2020-06-06 DIAGNOSIS — R1084 Generalized abdominal pain: Secondary | ICD-10-CM

## 2020-06-06 DIAGNOSIS — R140 Abdominal distension (gaseous): Secondary | ICD-10-CM | POA: Diagnosis not present

## 2020-06-07 ENCOUNTER — Ambulatory Visit: Payer: Medicare Other

## 2020-06-18 ENCOUNTER — Ambulatory Visit: Payer: Medicare Other | Attending: Internal Medicine

## 2020-06-18 DIAGNOSIS — Z23 Encounter for immunization: Secondary | ICD-10-CM

## 2020-06-19 ENCOUNTER — Other Ambulatory Visit: Payer: Medicare Other

## 2020-06-26 ENCOUNTER — Other Ambulatory Visit: Payer: Self-pay

## 2020-06-26 ENCOUNTER — Ambulatory Visit (INDEPENDENT_AMBULATORY_CARE_PROVIDER_SITE_OTHER): Payer: Medicare Other | Admitting: Neurology

## 2020-06-26 ENCOUNTER — Encounter: Payer: Self-pay | Admitting: Neurology

## 2020-06-26 VITALS — BP 114/70 | HR 84 | Ht 62.0 in | Wt 140.5 lb

## 2020-06-26 DIAGNOSIS — M5441 Lumbago with sciatica, right side: Secondary | ICD-10-CM | POA: Diagnosis not present

## 2020-06-26 DIAGNOSIS — G8929 Other chronic pain: Secondary | ICD-10-CM

## 2020-06-26 DIAGNOSIS — G43709 Chronic migraine without aura, not intractable, without status migrainosus: Secondary | ICD-10-CM

## 2020-06-26 MED ORDER — SUMATRIPTAN SUCCINATE 6 MG/0.5ML ~~LOC~~ SOLN: 6.0000 mg | SUBCUTANEOUS | 6 refills | Status: DC | PRN

## 2020-06-26 MED ORDER — ONDANSETRON 4 MG PO TBDP: 4.0000 mg | ORAL_TABLET | Freq: Three times a day (TID) | ORAL | 6 refills | Status: DC | PRN

## 2020-06-26 MED ORDER — RIZATRIPTAN BENZOATE 10 MG PO TBDP
ORAL_TABLET | ORAL | 6 refills | Status: DC
Start: 2020-06-26 — End: 2021-01-16

## 2020-06-26 MED ORDER — ZONISAMIDE 100 MG PO CAPS
100.0000 mg | ORAL_CAPSULE | Freq: Two times a day (BID) | ORAL | 4 refills | Status: DC
Start: 2020-06-26 — End: 2021-01-16

## 2020-06-26 NOTE — Patient Instructions (Signed)
For severe migraine headaches,  You may try Maxalt, versus Imitrex subcutaneous injection  Mixed it together with Aleve 1 to 2 tablets Zofran as needed for nausea Tizanidine as needed as muscle relaxant

## 2020-06-26 NOTE — Progress Notes (Signed)
PATIENT: Kerry Ortiz DOB: 27-Jun-1966  Chief Complaint  Patient presents with  . Migraine/Diffuse body pain    Reports an increase in episodes of "head tightness". Rizatriptan usually helps but not with this recent worsening.      HISTORICAL  Kerry Ortiz is a 54 year old female, seen in refer by her primary care doctor Kerry Ortiz for evaluation of chronic migraine, initial evaluation was on April 23 2017.  I reviewed and summarized the referring note, she had past medical history of acid reflux, fibromyalgia, prediabetes, hyperlipidemia, chronic migraine headaches,  She reported history of headaches since age 14, her typical migraine starting from occipital region spreading forward pounding severe headache, associated light noise sensitivity, lasting for a few hours, she has tried over-the-counter Tylenol ibuprofen with limited help, she is now getting more frequent headaches since July 2018 about 2-3 times each week,  She has been evaluated by headache [facility] in the past, still taking nortriptyline 25 mg 2 tablets every night, zonisamide 25 mg 3 tablets night as preventative medications,  I saw her previously for diffuse body achy pain, paresthesias throughout her body in 2017,   she had a history of fibromyalgia, carpal tunnel release surgery in the past, chronic migraines, she went on disability since 2008, complains of diffuse body achy pain, on polypharmacy treatment, including Elavil, Lyrica, Zonegran, She continue complains of depression symptoms, tearful during today's interview  Since 2012, she began to notice bilateral feet cold burning sensation, involving bottom and top of her feet, gradually trending up to knee level, getting worse with pressure, barium weight, she has chronic low back and neck pain, denies radiating pain, gait difficulty due to pain, no bowel and bladder incontinence, no bilateral upper extremity paresthesia or weakness,  She  complains of feeling tired all the time, lack of interest,  laboratory in July 2016, A1c was elevated 5.9, normal B12 567, TSH 1.4, CMP, she had more laboratory evaluation in August 2016  She is on polypharmacy treatment, taking Lyrica 150 mg twice a day, Celexa 10 mg daily, she still has intermittent bilateral feet paresthesia, no significant pain  EMG nerve conduction study in December 2016, there is no evidence of large fiber peripheral neuropathy  laboratory evaluation in November 2016:normal CBC, mildly low potassium on BMP 3.4  UPDATE Aug 20 2017: She was admitted to the hospital Oct 4 to 01/2017 with acute onset of dizziness, left lower extremity weakness, had extensive evaluations,  MRI of the brain in October 2018, that was normal.  Laboratory evaluation in October 2018, showed mild anemia hemoglobin of 11.3, CMP showed creatinine 0.85, normal TSH, A1c was 5.8, normal lipid profile, LDL was 90, negative ANA, CPK, ESR, HIV, troponin,  Echocardiogram was normal, Ultrasound of carotid artery showed less than 39% stenosis bilaterally.  Since that visit, she has intermittent dizziness, eye are twitching, ears are hurting.  UPDATE February 08 2019: She continue complains of occasionally migraine headaches, well controlled by Maxalt, she is still taking preventive medication Zonegran 100 mg twice a day, Elavil 25 mg at bedtime  She also complains of diffuse body achy pain, the care of rheumatologist, Plaquenil 400 mg daily,  She also complains 4 months history of worsening right-sided low back pain, radiating pain to right lower extremity, sometimes difficulty get out of bed  UPDATE Jun 26 2020: She has headache last for days in Sept 2021, on left side, tightness, ball up into fetal position, [facility], was told it  is tension headaches, was given Toradol shot, last for 2 days that helped.  She has tried Maxalt help some.   She start to keep diary and food, her headaches has  improves since.   She has not had headache for one month.  She was having headaches once very 3-4 weeks ,   I personally reviewed CT head wo in August 2021 that was normal. Laboratory evaluation in Sept 2021 showed normal CMP, CBC Hg 13.5, INR 1.0  REVIEW OF SYSTEMS: Full 14 system review of systems performed and notable only for as above  ALLERGIES: Allergies  Allergen Reactions  . Amoxicillin     Yeast infections    HOME MEDICATIONS: Current Outpatient Medications  Medication Sig Dispense Refill  . albuterol (PROVENTIL HFA;VENTOLIN HFA) 108 (90 BASE) MCG/ACT inhaler Inhale 2 puffs into the lungs every 6 (six) hours as needed for wheezing or shortness of breath. 1 Inhaler 2  . amitriptyline (ELAVIL) 25 MG tablet Take 1 tablet (25 mg total) by mouth at bedtime. 90 tablet 4  . cetirizine (ZYRTEC) 10 MG tablet Take 10 mg by mouth daily.    . DULoxetine (CYMBALTA) 60 MG capsule Take 1 capsule (60 mg total) by mouth daily. (Patient not taking: Reported on 03/23/2020) 90 capsule 3  . esomeprazole (NEXIUM) 40 MG capsule TAKE ONE CAPSULE BY MOUTH TWICE DAILY, 30 TO 60 MINUTES BEFORE FIRST AND LAST MEAL OF THE DAY (Patient taking differently: Take 40 mg by mouth 2 (two) times daily before a meal. ) 180 capsule 0  . fluticasone (FLONASE) 50 MCG/ACT nasal spray Place 2 sprays into both nostrils daily. 1 g 0  . hydroxychloroquine (PLAQUENIL) 200 MG tablet Take 400 mg by mouth daily.     Marland Kitchen ibuprofen (ADVIL) 800 MG tablet Take 800 mg by mouth every 8 (eight) hours as needed for mild pain.    . meclizine (ANTIVERT) 25 MG tablet Take 1 tablet (25 mg total) by mouth 3 (three) times daily as needed for dizziness. 12 tablet 0  . montelukast (SINGULAIR) 10 MG tablet Take 10 mg by mouth at bedtime.     . pregabalin (LYRICA) 200 MG capsule Take 200 mg by mouth 2 (two) times daily.    . rizatriptan (MAXALT-MLT) 10 MG disintegrating tablet Take 1 tab at onset of migraine.  May repeat in 2 hrs, if needed.   Max dose: 2 tabs/day. This is a 30 day prescription. Pls call [phone] to schedule appt to continue refills or request from PCP. 10 tablet 2  . sucralfate (CARAFATE) 1 G tablet Take 1 tablet (1 g total) by mouth 4 (four) times daily. (Patient taking differently: Take 1 g by mouth daily. ) 20 tablet 0  . zonisamide (ZONEGRAN) 100 MG capsule Take 1 capsule (100 mg total) by mouth 2 (two) times daily. Pls call [phone] to schedule appt to continue refills or request from PCP. 180 capsule 0   No current facility-administered medications for this visit.    PAST MEDICAL HISTORY: Past Medical History:  Diagnosis Date  . Acid reflux   . Asthma   . Disturbance of skin sensation   . Fibromyalgia   . Migraine   . Uterine fibroid     PAST SURGICAL HISTORY: Past Surgical History:  Procedure Laterality Date  . CARPAL TUNNEL RELEASE    . CYSTECTOMY    . HAND SURGERY Right   . SHOULDER SURGERY Right     FAMILY HISTORY: Family History  Problem Relation Age of Onset  .  Hypertension Mother   . Arrhythmia Mother   . Thyroid disease Mother   . Pneumonia Father   . Diabetes Maternal Grandmother   . Kidney disease Sister   . Multiple sclerosis Cousin     SOCIAL HISTORY:  Social History   Socioeconomic History  . Marital status: Single    Spouse name: Not on file  . Number of children: 1  . Years of education: HS  . Highest education level: Not on file  Occupational History  . Occupation: Disabled  Tobacco Use  . Smoking status: Never Smoker  . Smokeless tobacco: Never Used  Vaping Use  . Vaping Use: Never used  Substance and Sexual Activity  . Alcohol use: No  . Drug use: No  . Sexual activity: Not Currently    Birth control/protection: None  Other Topics Concern  . Not on file  Social History Narrative   Lives at home alone.   Right-handed.   No caffeine use.   Social Determinants of Health   Financial Resource Strain:   . Difficulty of Paying Living Expenses: Not on  file  Food Insecurity:   . Worried About Charity fundraiser in the Last Year: Not on file  . Ran Out of Food in the Last Year: Not on file  Transportation Needs:   . Lack of Transportation (Medical): Not on file  . Lack of Transportation (Non-Medical): Not on file  Physical Activity:   . Days of Exercise per Week: Not on file  . Minutes of Exercise per Session: Not on file  Stress:   . Feeling of Stress : Not on file  Social Connections:   . Frequency of Communication with Friends and Family: Not on file  . Frequency of Social Gatherings with Friends and Family: Not on file  . Attends Religious Services: Not on file  . Active Member of Clubs or Organizations: Not on file  . Attends Archivist Meetings: Not on file  . Marital Status: Not on file  Intimate Partner Violence:   . Fear of Current or Ex-Partner: Not on file  . Emotionally Abused: Not on file  . Physically Abused: Not on file  . Sexually Abused: Not on file     PHYSICAL EXAM   Vitals:   06/26/20 1323  Height: '5\' 2"'  (1.575 m)   Not recorded     Body mass index is 24.69 kg/m.  PHYSICAL EXAMNIATION:  Gen: NAD, conversant, well nourised, obese, well groomed             NEUROLOGICAL EXAM:  MENTAL STATUS: Speech/cognition: Awake, alert, oriented to history taking and casual conversation   CRANIAL NERVES: CN II: Visual fields are full to confrontation.  Pupils are round equal and briskly reactive to light. CN III, IV, VI: extraocular movement are normal. No ptosis. CN V: Facial sensation is intact to pinprick in all 3 divisions bilaterally.    CN VII: Face is symmetric with normal eye closure and smile. CN VIII: Hearing is normal to rubbing fingers CN IX, X: Palate elevates symmetrically. Phonation is normal. CN XI: Head turning and shoulder shrug are intact   MOTOR: There is no pronator drift of out-stretched arms. Muscle bulk and tone are normal. Muscle strength is  normal.  REFLEXES: Reflexes are 2+ and symmetric at the biceps, triceps, knees, and ankles. Plantar responses are flexor.  SENSORY: Intact to light touch, pinprick, positional sensation and vibratory sensation are intact in fingers and toes.  COORDINATION: Rapid alternating  movements and fine finger movements are intact. There is no dysmetria on finger-to-nose and heel-knee-shin.    GAIT/STANCE: Posture is normal. Gait is steady with normal steps, base, arm swing, and turning.    DIAGNOSTIC DATA (LABS, IMAGING, TESTING) - I reviewed patient records, labs, notes, testing and imaging myself where available.   ASSESSMENT AND PLAN  DANNON PERLOW is a 54 y.o. female   Chronic migraine headache  Continue preventive medication zonisamide to 100 mg twice a day, Elavil 25 mg at bedtime  Maxalt as needed  Imitrex subcutaneous injection for severe prolonged headaches, may combine it with Zofran, NSAIDs, tizanidine Yes Right low back pain  I personally reviewed MRI lumbar in August 2020, showed no significant abnormality  EMG/NCS was normal.  Symptoms have improved.    Marcial Pacas, M.D. Ph.D.  [facility] [address], [place], [place] [address] Ph: [phone] Fax: [phone]  CC: Kerry Seashore, MD

## 2020-07-05 ENCOUNTER — Ambulatory Visit
Admission: RE | Admit: 2020-07-05 | Discharge: 2020-07-05 | Disposition: A | Discharge status: Home / Self Care | Payer: Medicare Other | Source: Ambulatory Visit | Attending: Gastroenterology | Admitting: Gastroenterology

## 2020-07-05 DIAGNOSIS — K760 Fatty (change of) liver, not elsewhere classified: Secondary | ICD-10-CM | POA: Diagnosis not present

## 2020-07-05 DIAGNOSIS — R140 Abdominal distension (gaseous): Secondary | ICD-10-CM

## 2020-07-05 DIAGNOSIS — Z780 Asymptomatic menopausal state: Secondary | ICD-10-CM | POA: Diagnosis not present

## 2020-07-05 DIAGNOSIS — R1084 Generalized abdominal pain: Secondary | ICD-10-CM

## 2020-07-12 DIAGNOSIS — M2550 Pain in unspecified joint: Secondary | ICD-10-CM | POA: Diagnosis not present

## 2020-10-03 DIAGNOSIS — Z9189 Other specified personal risk factors, not elsewhere classified: Secondary | ICD-10-CM | POA: Diagnosis not present

## 2020-10-17 DIAGNOSIS — M5441 Lumbago with sciatica, right side: Secondary | ICD-10-CM | POA: Diagnosis not present

## 2020-10-17 DIAGNOSIS — M545 Low back pain, unspecified: Secondary | ICD-10-CM | POA: Diagnosis not present

## 2020-10-17 DIAGNOSIS — M5442 Lumbago with sciatica, left side: Secondary | ICD-10-CM | POA: Diagnosis not present

## 2020-11-11 ENCOUNTER — Other Ambulatory Visit: Payer: Self-pay | Admitting: Neurology

## 2020-11-15 DIAGNOSIS — M79643 Pain in unspecified hand: Secondary | ICD-10-CM | POA: Diagnosis not present

## 2020-11-15 DIAGNOSIS — M2550 Pain in unspecified joint: Secondary | ICD-10-CM | POA: Diagnosis not present

## 2020-11-15 DIAGNOSIS — M797 Fibromyalgia: Secondary | ICD-10-CM | POA: Diagnosis not present

## 2020-11-15 DIAGNOSIS — R5383 Other fatigue: Secondary | ICD-10-CM | POA: Diagnosis not present

## 2020-12-11 DIAGNOSIS — Z0000 Encounter for general adult medical examination without abnormal findings: Secondary | ICD-10-CM | POA: Diagnosis not present

## 2020-12-11 DIAGNOSIS — M79643 Pain in unspecified hand: Secondary | ICD-10-CM | POA: Diagnosis not present

## 2020-12-11 DIAGNOSIS — M25562 Pain in left knee: Secondary | ICD-10-CM | POA: Diagnosis not present

## 2020-12-11 DIAGNOSIS — R5383 Other fatigue: Secondary | ICD-10-CM | POA: Diagnosis not present

## 2020-12-11 DIAGNOSIS — M2550 Pain in unspecified joint: Secondary | ICD-10-CM | POA: Diagnosis not present

## 2020-12-11 DIAGNOSIS — R7303 Prediabetes: Secondary | ICD-10-CM | POA: Diagnosis not present

## 2020-12-11 DIAGNOSIS — M797 Fibromyalgia: Secondary | ICD-10-CM | POA: Diagnosis not present

## 2020-12-18 DIAGNOSIS — H6121 Impacted cerumen, right ear: Secondary | ICD-10-CM | POA: Diagnosis not present

## 2020-12-18 DIAGNOSIS — Z0000 Encounter for general adult medical examination without abnormal findings: Secondary | ICD-10-CM | POA: Diagnosis not present

## 2020-12-18 DIAGNOSIS — R7309 Other abnormal glucose: Secondary | ICD-10-CM | POA: Diagnosis not present

## 2020-12-25 ENCOUNTER — Ambulatory Visit: Payer: Medicare Other | Admitting: Neurology

## 2020-12-25 NOTE — Progress Notes (Deleted)
  PATIENT: Kerry Ortiz DOB: 11/22/1965  No chief complaint on file.    HISTORICAL  Kerry Ortiz is a 55-year-old female, seen in refer by her primary care doctor Ramachandran, Ajith for evaluation of chronic migraine, initial evaluation was on April 23 2017.  I reviewed and summarized the referring note, she had past medical history of acid reflux, fibromyalgia, prediabetes, hyperlipidemia, chronic migraine headaches,  She reported history of headaches since age 40, her typical migraine starting from occipital region spreading forward pounding severe headache, associated light noise sensitivity, lasting for a few hours, she has tried over-the-counter Tylenol ibuprofen with limited help, she is now getting more frequent headaches since July 2018 about 2-3 times each week,  [facility], still taking nortriptyline 25 mg 2 tablets every night, zonisamide 25 mg 3 tablets night as preventative medications,  I saw her previously for diffuse body achy pain, paresthesias throughout her body in 2017,   she had a history of fibromyalgia, carpal tunnel release surgery in the past, chronic migraines, she went on disability since 2008, complains of diffuse body achy pain, on polypharmacy treatment, including Elavil, Lyrica, Zonegran, She continue complains of depression symptoms, tearful during today's interview  Since 2012, she began to notice bilateral feet cold burning sensation, involving bottom and top of her feet, gradually trending up to knee level, getting worse with pressure, barium weight, she has chronic low back and neck pain, denies radiating pain, gait difficulty due to pain, no bowel and bladder incontinence, no bilateral upper extremity paresthesia or weakness,  She complains of feeling tired all the time, lack of interest,  laboratory in July 2016, A1c was elevated 5.9, normal B12 567, TSH 1.4, CMP, she had more laboratory  evaluation in August 2016  She is on polypharmacy treatment, taking Lyrica 150 mg twice a day, Celexa 10 mg daily, she still has intermittent bilateral feet paresthesia, no significant pain  EMG nerve conduction study in December 2016, there is no evidence of large fiber peripheral neuropathy  laboratory evaluation in November 2016:normal CBC, mildly low potassium on BMP 3.4  UPDATE Aug 20 2017: She was admitted to the hospital Oct 4 to 01/2017 with acute onset of dizziness, left lower extremity weakness, had extensive evaluations,  MRI of the brain in October 2018, that was normal.  Laboratory evaluation in October 2018, showed mild anemia hemoglobin of 11.3, CMP showed creatinine 0.85, normal TSH, A1c was 5.8, normal lipid profile, LDL was 90, negative ANA, CPK, ESR, HIV, troponin,  Echocardiogram was normal, Ultrasound of carotid artery showed less than 39% stenosis bilaterally.  Since that visit, she has intermittent dizziness, eye are twitching, ears are hurting.  UPDATE February 08 2019: She continue complains of occasionally migraine headaches, well controlled by Maxalt, she is still taking preventive medication Zonegran 100 mg twice a day, Elavil 25 mg at bedtime  She also complains of diffuse body achy pain, the care of rheumatologist, Plaquenil 400 mg daily,  She also complains 4 months history of worsening right-sided low back pain, radiating pain to right lower extremity, sometimes difficulty get out of bed  UPDATE Jun 26 2020: She has headache last for days in Sept 2021, on left side, tightness, ball up into fetal position, [facility], was told it is tension headaches, was given Toradol shot, last for 2 days that helped.  She has tried Maxalt help some.   She start to keep diary and   food, her headaches has improves since.   She has not had headache for one month.  She was having headaches once very 3-4 weeks ,   I personally reviewed CT head wo in August 2021  that was normal. Laboratory evaluation in Sept 2021 showed normal CMP, CBC Hg 13.5, INR 1.0  Update Dec 25, 2020 SS:   REVIEW OF SYSTEMS: Full 14 system review of systems performed and notable only for as above  ALLERGIES: Allergies  Allergen Reactions  . Amoxicillin     Yeast infections    HOME MEDICATIONS: Current Outpatient Medications  Medication Sig Dispense Refill  . albuterol (PROVENTIL HFA;VENTOLIN HFA) 108 (90 BASE) MCG/ACT inhaler Inhale 2 puffs into the lungs every 6 (six) hours as needed for wheezing or shortness of breath. 1 Inhaler 2  . amitriptyline (ELAVIL) 25 MG tablet Take 1 tablet (25 mg total) by mouth at bedtime. 90 tablet 4  . cetirizine (ZYRTEC) 10 MG tablet Take 10 mg by mouth daily.    Marland Kitchen esomeprazole (NEXIUM) 40 MG capsule TAKE ONE CAPSULE BY MOUTH TWICE DAILY, 30 TO 60 MINUTES BEFORE FIRST AND LAST MEAL OF THE DAY (Patient taking differently: Take 40 mg by mouth 2 (two) times daily before a meal. ) 180 capsule 0  . fluticasone (FLONASE) 50 MCG/ACT nasal spray Place 2 sprays into both nostrils daily. 1 g 0  . hydroxychloroquine (PLAQUENIL) 200 MG tablet Take 400 mg by mouth daily.     Marland Kitchen ibuprofen (ADVIL) 800 MG tablet Take 800 mg by mouth every 8 (eight) hours as needed for mild pain.    . meclizine (ANTIVERT) 25 MG tablet Take 1 tablet (25 mg total) by mouth 3 (three) times daily as needed for dizziness. 12 tablet 0  . montelukast (SINGULAIR) 10 MG tablet Take 10 mg by mouth at bedtime.     . ondansetron (ZOFRAN ODT) 4 MG disintegrating tablet Take 1 tablet (4 mg total) by mouth every 8 (eight) hours as needed. 20 tablet 6  . pregabalin (LYRICA) 200 MG capsule Take 200 mg by mouth 2 (two) times daily.    . rizatriptan (MAXALT-MLT) 10 MG disintegrating tablet Take 1 tab at onset of migraine.  May repeat in 2 hrs, if needed.  Max dose: 2 tabs/day. This is a 30 day prescription. 10 tablet 6  . sucralfate (CARAFATE) 1 G tablet Take 1 tablet (1 g total) by mouth 4  (four) times daily. (Patient taking differently: Take 1 g by mouth daily. ) 20 tablet 0  . SUMAtriptan (IMITREX) 6 MG/0.5ML SOLN injection Inject 0.5 mLs (6 mg total) into the skin every 2 (two) hours as needed for migraine or headache. May repeat in 2 hours if headache persists or recurs. 5 mL 6  . tiZANidine (ZANAFLEX) 4 MG tablet Take 4 mg by mouth every 6 (six) hours as needed for muscle spasms.    Marland Kitchen zonisamide (ZONEGRAN) 100 MG capsule Take 1 capsule (100 mg total) by mouth 2 (two) times daily. Pls call [phone] to schedule appt to continue refills or request from PCP. 180 capsule 4   No current facility-administered medications for this visit.    PAST MEDICAL HISTORY: Past Medical History:  Diagnosis Date  . Acid reflux   . Asthma   . Disturbance of skin sensation   . Fibromyalgia   . Migraine   . Uterine fibroid     PAST SURGICAL HISTORY: Past Surgical History:  Procedure Laterality Date  . CARPAL TUNNEL RELEASE    .  CYSTECTOMY    . HAND SURGERY Right   . SHOULDER SURGERY Right     FAMILY HISTORY: Family History  Problem Relation Age of Onset  . Hypertension Mother   . Arrhythmia Mother   . Thyroid disease Mother   . Pneumonia Father   . Diabetes Maternal Grandmother   . Kidney disease Sister   . Multiple sclerosis Cousin     SOCIAL HISTORY:  Social History   Socioeconomic History  . Marital status: Single    Spouse name: Not on file  . Number of children: 1  . Years of education: HS  . Highest education level: Not on file  Occupational History  . Occupation: Disabled  Tobacco Use  . Smoking status: Never Smoker  . Smokeless tobacco: Never Used  Vaping Use  . Vaping Use: Never used  Substance and Sexual Activity  . Alcohol use: No  . Drug use: No  . Sexual activity: Not Currently    Birth control/protection: None  Other Topics Concern  . Not on file  Social History Narrative   Lives at home alone.   Right-handed.   No caffeine use.   Social  Determinants of Health   Financial Resource Strain: Not on file  Food Insecurity: Not on file  Transportation Needs: Not on file  Physical Activity: Not on file  Stress: Not on file  Social Connections: Not on file  Intimate Partner Violence: Not on file     PHYSICAL EXAM   There were no vitals filed for this visit. Not recorded     There is no height or weight on file to calculate BMI.  PHYSICAL EXAMNIATION:  Gen: NAD, conversant, well nourised, obese, well groomed             NEUROLOGICAL EXAM:  MENTAL STATUS: Speech/cognition: Awake, alert, oriented to history taking and casual conversation   CRANIAL NERVES: CN II: Visual fields are full to confrontation.  Pupils are round equal and briskly reactive to light. CN III, IV, VI: extraocular movement are normal. No ptosis. CN V: Facial sensation is intact to pinprick in all 3 divisions bilaterally.    CN VII: Face is symmetric with normal eye closure and smile. CN VIII: Hearing is normal to rubbing fingers CN IX, X: Palate elevates symmetrically. Phonation is normal. CN XI: Head turning and shoulder shrug are intact   MOTOR: There is no pronator drift of out-stretched arms. Muscle bulk and tone are normal. Muscle strength is normal.  REFLEXES: Reflexes are 2+ and symmetric at the biceps, triceps, knees, and ankles. Plantar responses are flexor.  SENSORY: Intact to light touch, pinprick, positional sensation and vibratory sensation are intact in fingers and toes.  COORDINATION: Rapid alternating movements and fine finger movements are intact. There is no dysmetria on finger-to-nose and heel-knee-shin.    GAIT/STANCE: Posture is normal. Gait is steady with normal steps, base, arm swing, and turning.    DIAGNOSTIC DATA (LABS, IMAGING, TESTING) - I reviewed patient records, labs, notes, testing and imaging myself where available.   ASSESSMENT AND PLAN  Stephane C Lightsey is a 55 y.o. female   Chronic migraine  headache  Continue preventive medication zonisamide to 100 mg twice a day, Elavil 25 mg at bedtime  Maxalt as needed  Imitrex subcutaneous injection for severe prolonged headaches, may combine it with Zofran, NSAIDs, tizanidine Yes Right low back pain  I personally reviewed MRI lumbar in August 2020, showed no significant abnormality  EMG/NCS was normal.  Symptoms have   improved.    Yijun Yan, M.D. Ph.D.  [facility] [address], [address] [place], [place] [address] Ph: [phone] Fax: [phone]  CC: Ramachandran, Ajith, MD 

## 2021-01-16 ENCOUNTER — Telehealth: Payer: Self-pay | Admitting: Neurology

## 2021-01-16 ENCOUNTER — Ambulatory Visit (INDEPENDENT_AMBULATORY_CARE_PROVIDER_SITE_OTHER): Payer: Medicare Other | Admitting: Neurology

## 2021-01-16 ENCOUNTER — Encounter: Payer: Self-pay | Admitting: Neurology

## 2021-01-16 VITALS — BP 102/62 | HR 70 | Ht 62.0 in | Wt 137.0 lb

## 2021-01-16 DIAGNOSIS — G43709 Chronic migraine without aura, not intractable, without status migrainosus: Secondary | ICD-10-CM | POA: Diagnosis not present

## 2021-01-16 DIAGNOSIS — H539 Unspecified visual disturbance: Secondary | ICD-10-CM

## 2021-01-16 MED ORDER — ZONISAMIDE 100 MG PO CAPS: 100.0000 mg | ORAL_CAPSULE | Freq: Two times a day (BID) | ORAL | 4 refills | Status: DC

## 2021-01-16 MED ORDER — AMITRIPTYLINE HCL 25 MG PO TABS: 25.0000 mg | ORAL_TABLET | Freq: Every day | ORAL | 4 refills | Status: DC

## 2021-01-16 MED ORDER — RIZATRIPTAN BENZOATE 10 MG PO TBDP: ORAL_TABLET | ORAL | 6 refills | Status: DC

## 2021-01-16 MED ORDER — SUMATRIPTAN SUCCINATE 6 MG/0.5ML ~~LOC~~ SOLN: 6.0000 mg | SUBCUTANEOUS | 6 refills | Status: DC | PRN

## 2021-01-16 MED ORDER — ONDANSETRON 4 MG PO TBDP: 4.0000 mg | ORAL_TABLET | Freq: Three times a day (TID) | ORAL | 6 refills | Status: DC | PRN

## 2021-01-16 NOTE — Progress Notes (Signed)
PATIENT: Kerry Ortiz DOB: 11/08/1965  Chief Complaint  Patient presents with   Follow-up    New rm, alone, states her migraines are stable    HISTORICAL  Kerry Ortiz is a 55 year old female, seen in refer by her primary care doctor Merrilee Seashore for evaluation of chronic migraine, initial evaluation was on April 23 2017.  I reviewed and summarized the referring note, she had past medical history of acid reflux, fibromyalgia, prediabetes, hyperlipidemia, chronic migraine headaches,  She reported history of headaches since age 1, her typical migraine starting from occipital region spreading forward pounding severe headache, associated light noise sensitivity, lasting for a few hours, she has tried over-the-counter Tylenol ibuprofen with limited help, she is now getting more frequent headaches since July 2018 about 2-3 times each week,  She has been evaluated by [facility] in the past, still taking nortriptyline 25 mg 2 tablets every night, zonisamide 25 mg 3 tablets night as preventative medications,  I saw her previously for diffuse body achy pain, paresthesias throughout her body in 2017,   she had a history of fibromyalgia, carpal tunnel release surgery in the past, chronic migraines, she went on disability since 2008, complains of diffuse body achy pain, on polypharmacy treatment, including Elavil, Lyrica, Zonegran, She continue complains of depression symptoms, tearful during today's interview   Since 2012, she began to notice bilateral feet cold burning sensation, involving bottom and top of her feet, gradually trending up to knee level, getting worse with pressure, barium weight, she has chronic low back and neck pain, denies radiating pain, gait difficulty due to pain, no bowel and bladder incontinence, no bilateral upper extremity paresthesia or weakness,   She complains of feeling tired all the time, lack of interest,  laboratory in July 2016, A1c  was elevated 5.9, normal B12 567, TSH 1.4, CMP, she had more laboratory evaluation in August 2016  She is on polypharmacy treatment, taking Lyrica 150 mg twice a day, Celexa 10 mg daily, she still has intermittent bilateral feet paresthesia, no significant pain  EMG nerve conduction study in December 2016, there is no evidence of large fiber peripheral neuropathy   laboratory evaluation in November 2016:normal CBC, mildly low potassium on BMP 3.4  UPDATE Aug 20 2017: She was admitted to the hospital Oct 4 to 01/2017 with acute onset of dizziness, left lower extremity weakness, had extensive evaluations,  MRI of the brain in October 2018, that was normal.  Laboratory evaluation in October 2018, showed mild anemia hemoglobin of 11.3, CMP showed creatinine 0.85, normal TSH, A1c was 5.8, normal lipid profile, LDL was 90, negative ANA, CPK, ESR, HIV, troponin,  Echocardiogram was normal, Ultrasound of carotid artery showed less than 39% stenosis bilaterally.  Since that visit, she has intermittent dizziness, eye are twitching, ears are hurting.  UPDATE February 08 2019: She continue complains of occasionally migraine headaches, well controlled by Maxalt, she is still taking preventive medication Zonegran 100 mg twice a day, Elavil 25 mg at bedtime  She also complains of diffuse body achy pain, the care of rheumatologist, Plaquenil 400 mg daily,  She also complains 4 months history of worsening right-sided low back pain, radiating pain to right lower extremity, sometimes difficulty get out of bed  UPDATE Jun 26 2020: She has headache last for days in Sept 2021, on left side, tightness, ball up into fetal position, [facility], was told it is tension headaches, was given Toradol shot, last for 2  days that helped.  She has tried Maxalt help some.   She start to keep diary and food, her headaches has improves since.   She has not had headache for one month.  She was having headaches  once very 3-4 weeks ,   I personally reviewed CT head wo in August 2021 that was normal. Laboratory evaluation in Sept 2021 showed normal CMP, CBC Hg 13.5, INR 1.0  Update January 16, 2021 SS: Here today alone, reports for 2 weeks, changes to right-sided vision, the right peripheral half of the vision, is dark, like string of hair or smoke. No pain. No double vision, it moves around. Noted to swat around her eye, like bug flying around.  Headaches remain well controlled, on Zonegran, amitriptyline.  On average 1 headache a month.  Takes Maxalt with good benefit.  Has yet to use Imitrex injection, but does need needles and syringes.  On Plaquenil for RA.  REVIEW OF SYSTEMS: Full 14 system review of systems performed and notable only for as above  See HPI  ALLERGIES: Allergies  Allergen Reactions   Amoxicillin     Yeast infections    HOME MEDICATIONS: Current Outpatient Medications  Medication Sig Dispense Refill   albuterol (PROVENTIL HFA;VENTOLIN HFA) 108 (90 BASE) MCG/ACT inhaler Inhale 2 puffs into the lungs every 6 (six) hours as needed for wheezing or shortness of breath. 1 Inhaler 2   amitriptyline (ELAVIL) 25 MG tablet Take 1 tablet (25 mg total) by mouth at bedtime. 90 tablet 4   cetirizine (ZYRTEC) 10 MG tablet Take 10 mg by mouth daily.     esomeprazole (NEXIUM) 40 MG capsule TAKE ONE CAPSULE BY MOUTH TWICE DAILY, 30 TO 60 MINUTES BEFORE FIRST AND LAST MEAL OF THE DAY (Patient taking differently: Take 40 mg by mouth 2 (two) times daily before a meal.) 180 capsule 0   fluticasone (FLONASE) 50 MCG/ACT nasal spray Place 2 sprays into both nostrils daily. 1 g 0   hydroxychloroquine (PLAQUENIL) 200 MG tablet Take 400 mg by mouth daily.      ibuprofen (ADVIL) 800 MG tablet Take 800 mg by mouth every 8 (eight) hours as needed for mild pain.     meclizine (ANTIVERT) 25 MG tablet Take 1 tablet (25 mg total) by mouth 3 (three) times daily as needed for dizziness. 12 tablet 0   montelukast  (SINGULAIR) 10 MG tablet Take 10 mg by mouth at bedtime.      ondansetron (ZOFRAN ODT) 4 MG disintegrating tablet Take 1 tablet (4 mg total) by mouth every 8 (eight) hours as needed. 20 tablet 6   pregabalin (LYRICA) 200 MG capsule Take 200 mg by mouth 2 (two) times daily.     rizatriptan (MAXALT-MLT) 10 MG disintegrating tablet Take 1 tab at onset of migraine.  May repeat in 2 hrs, if needed.  Max dose: 2 tabs/day. This is a 30 day prescription. 10 tablet 6   sucralfate (CARAFATE) 1 G tablet Take 1 tablet (1 g total) by mouth 4 (four) times daily. (Patient taking differently: Take 1 g by mouth daily.) 20 tablet 0   SUMAtriptan (IMITREX) 6 MG/0.5ML SOLN injection Inject 0.5 mLs (6 mg total) into the skin every 2 (two) hours as needed for migraine or headache. May repeat in 2 hours if headache persists or recurs. 5 mL 6   tiZANidine (ZANAFLEX) 4 MG tablet Take 4 mg by mouth every 6 (six) hours as needed for muscle spasms.     zonisamide (ZONEGRAN)  100 MG capsule Take 1 capsule (100 mg total) by mouth 2 (two) times daily. Pls call [phone] to schedule appt to continue refills or request from PCP. 180 capsule 4   No current facility-administered medications for this visit.    PAST MEDICAL HISTORY: Past Medical History:  Diagnosis Date   Acid reflux    Asthma    Disturbance of skin sensation    Fibromyalgia    Migraine    Uterine fibroid     PAST SURGICAL HISTORY: Past Surgical History:  Procedure Laterality Date   CARPAL TUNNEL RELEASE     CYSTECTOMY     HAND SURGERY Right    SHOULDER SURGERY Right     FAMILY HISTORY: Family History  Problem Relation Age of Onset   Hypertension Mother    Arrhythmia Mother    Thyroid disease Mother    Pneumonia Father    Diabetes Maternal Grandmother    Kidney disease Sister    Multiple sclerosis Cousin     SOCIAL HISTORY:  Social History   Socioeconomic History   Marital status: Single    Spouse name: Not on file   Number of children:  1   Years of education: HS   Highest education level: Not on file  Occupational History   Occupation: Disabled  Tobacco Use   Smoking status: Never   Smokeless tobacco: Never  Vaping Use   Vaping Use: Never used  Substance and Sexual Activity   Alcohol use: No   Drug use: No   Sexual activity: Not Currently    Birth control/protection: None  Other Topics Concern   Not on file  Social History Narrative   Lives at home alone.   Right-handed.   No caffeine use.   Social Determinants of Health   Financial Resource Strain: Not on file  Food Insecurity: Not on file  Transportation Needs: Not on file  Physical Activity: Not on file  Stress: Not on file  Social Connections: Not on file  Intimate Partner Violence: Not on file   PHYSICAL EXAM   Vitals:   01/16/21 1508  BP: 102/62  Pulse: 70  Weight: 137 lb (62.1 kg)  Height: '5\' 2"'  (1.575 m)   Not recorded     Body mass index is 25.06 kg/m.  PHYSICAL EXAMNIATION:  Physical Exam  General: The patient is alert and cooperative at the time of the examination. Makes frequent swat motions around right eye.  Skin: No significant peripheral edema is noted.  Neurologic Exam  Mental status: The patient is alert and oriented x 3 at the time of the examination. The patient has apparent normal recent and remote memory, with an apparently normal attention span and concentration ability.  Cranial nerves: Facial symmetry is present. Speech is normal, no aphasia or dysarthria is noted. Extraocular movements are full. Visual fields are full.  Motor: The patient has good strength in all 4 extremities.  Sensory examination: Soft touch sensation is symmetric on the face, arms, and legs.  Coordination: The patient has good finger-nose-finger and heel-to-shin bilaterally.  Gait and station: The patient has a normal gait. Tandem gait is normal.   Reflexes: Deep tendon reflexes are symmetric.  CRANIAL NERVES:  DIAGNOSTIC DATA  (LABS, IMAGING, TESTING) - I reviewed patient records, labs, notes, testing and imaging myself where available.  ASSESSMENT AND PLAN  Kerry Ortiz is a 55 y.o. female   Chronic migraine headache -Well-controlled, 1 every month -Continue Zonegran 100 mg twice a day, amitriptyline 25  mg at bedtime for migraine prevention -Continue Maxalt as needed for acute headache, may use Imitrex subcutaneous injection for severe prolonged headache, may combine with Zofran, NSAIDs, tizanidine  2. Right vision change -Urgent referral to ophthalmology, right peripheral vision dark x 2 weeks  I spent 32 minutes of face-to-face and non-face-to-face time with patient.  This included previsit chart review, lab review, study review, order entry, discussing medications, vision symptoms, follow-up, urgent ophthalmology referral.  Evangeline Dakin, DNP  [facility] [address], [place], [place] [address] [phone]

## 2021-01-16 NOTE — Patient Instructions (Signed)
Urgent referral to eye doctor Continue current medications See you back 6-8 months for migraine management

## 2021-01-16 NOTE — Telephone Encounter (Signed)
Please assist with urgent referral to ophthalmology.

## 2021-01-17 DIAGNOSIS — K130 Diseases of lips: Secondary | ICD-10-CM | POA: Diagnosis not present

## 2021-01-17 DIAGNOSIS — H43391 Other vitreous opacities, right eye: Secondary | ICD-10-CM | POA: Diagnosis not present

## 2021-01-17 NOTE — Telephone Encounter (Signed)
Attempted to contact patient again but unsuccessful. I contacted patient's mother (DPR) and was able to speak to her. She states patient sleeps late in the day due to medications she takes. She states if she speaks with pt she will have her call us.

## 2021-01-17 NOTE — Telephone Encounter (Signed)
Patient returned my call. She states she is not able to see Dr. Katy Fitch today at 3:00 due to another appointment she has at 2:30. I provided patient with the number to Dr. Zenia Resides office and advised her to call to reschedule.

## 2021-01-17 NOTE — Telephone Encounter (Addendum)
Patient called me back, she states when she spoke with Dr. Zenia Resides office to reschedule she was told they didn't have anything until the end of July. She states she called [facility] and made an appt for tomorrow. I will fax referral/notes over.

## 2021-01-17 NOTE — Telephone Encounter (Signed)
I called [facility] @ [phone]. They are able to fit patient in today at 3:00. I called the patient, but she did not answer and her vm is full. Will attempt to contact patient until I am able to reach her. Faxed referral to [phone].

## 2021-01-18 DIAGNOSIS — H04123 Dry eye syndrome of bilateral lacrimal glands: Secondary | ICD-10-CM | POA: Diagnosis not present

## 2021-01-18 DIAGNOSIS — H35371 Puckering of macula, right eye: Secondary | ICD-10-CM | POA: Diagnosis not present

## 2021-01-18 DIAGNOSIS — H43811 Vitreous degeneration, right eye: Secondary | ICD-10-CM | POA: Diagnosis not present

## 2021-01-18 DIAGNOSIS — G43909 Migraine, unspecified, not intractable, without status migrainosus: Secondary | ICD-10-CM | POA: Diagnosis not present

## 2021-02-07 ENCOUNTER — Telehealth: Payer: Self-pay | Admitting: Neurology

## 2021-02-07 MED ORDER — SUMATRIPTAN SUCCINATE 6 MG/0.5ML ~~LOC~~ SOAJ: SUBCUTANEOUS | 6 refills | Status: DC

## 2021-02-07 NOTE — Telephone Encounter (Signed)
Pt called stating that she is needing an RX for her to be able to get the Syringes for her SUMAtriptan (IMITREX) 6 MG/0.5ML SOLN injection Please advise.

## 2021-02-07 NOTE — Telephone Encounter (Addendum)
I spoke to the patient. She preferes auto-injector over drawing up the solution. Sent to Dr. Krista Blue to co-sign change. Rx sent to pharmacy.

## 2021-02-14 ENCOUNTER — Other Ambulatory Visit: Payer: Self-pay

## 2021-02-14 ENCOUNTER — Ambulatory Visit (HOSPITAL_COMMUNITY)
Admission: EM | Admit: 2021-02-14 | Discharge: 2021-02-14 | Disposition: A | Discharge status: Home / Self Care | Payer: Medicare Other | Attending: Emergency Medicine | Admitting: Emergency Medicine

## 2021-02-14 ENCOUNTER — Encounter (HOSPITAL_COMMUNITY): Payer: Self-pay | Admitting: *Deleted

## 2021-02-14 DIAGNOSIS — U071 COVID-19: Secondary | ICD-10-CM | POA: Diagnosis not present

## 2021-02-14 DIAGNOSIS — Z20822 Contact with and (suspected) exposure to covid-19: Secondary | ICD-10-CM | POA: Diagnosis not present

## 2021-02-14 DIAGNOSIS — J029 Acute pharyngitis, unspecified: Secondary | ICD-10-CM

## 2021-02-14 LAB — BASIC METABOLIC PANEL
Anion gap: 9 (ref 5–15)
BUN: 11 mg/dL (ref 6–20)
CO2: 24 mmol/L (ref 22–32)
Calcium: 9.2 mg/dL (ref 8.9–10.3)
Chloride: 101 mmol/L (ref 98–111)
Creatinine, Ser: 0.97 mg/dL (ref 0.44–1.00)
GFR, Estimated: >60 mL/min (ref >60)
Glucose, Bld: 88 mg/dL (ref 70–99)
Potassium: 3.9 mmol/L (ref 3.5–5.1)
Sodium: 134 mmol/L — ABNORMAL LOW (ref 135–145)

## 2021-02-14 LAB — POCT RAPID STREP A, ED / UC: Streptococcus, Group A Screen (Direct): NEGATIVE

## 2021-02-14 MED ORDER — ACETAMINOPHEN 325 MG PO TABS
ORAL_TABLET | ORAL | Status: AC
  Filled 2021-02-14: qty 2

## 2021-02-14 MED ORDER — KETOROLAC TROMETHAMINE 30 MG/ML IJ SOLN
INTRAMUSCULAR | Status: AC
  Filled 2021-02-14: qty 1

## 2021-02-14 MED ORDER — FLUTICASONE PROPIONATE 50 MCG/ACT NA SUSP: 2.0000 | Freq: Every day | NASAL | 0 refills | Status: AC

## 2021-02-14 MED ORDER — IBUPROFEN 600 MG PO TABS: 600.0000 mg | ORAL_TABLET | Freq: Four times a day (QID) | ORAL | 0 refills | Status: DC | PRN

## 2021-02-14 MED ORDER — ACETAMINOPHEN 325 MG PO TABS
650.0000 mg | ORAL_TABLET | Freq: Once | ORAL | Status: AC
  Administered 2021-02-14: 650 mg via ORAL

## 2021-02-14 MED ORDER — KETOROLAC TROMETHAMINE 30 MG/ML IJ SOLN
30.0000 mg | Freq: Once | INTRAMUSCULAR | Status: AC
  Administered 2021-02-14: 30 mg via INTRAMUSCULAR

## 2021-02-14 NOTE — ED Triage Notes (Addendum)
Pt reports Sx's started 2 days ago. Nausea,Ha and sore throat.

## 2021-02-14 NOTE — ED Provider Notes (Signed)
HPI  SUBJECTIVE:  Patient reports sore throat starting 2 days ago. sx worse with swallowing.  Sx better with nothing. Has been taking Mucinex, salt water gargles w/ o relief.  No fever at home   No neck stiffness  No Cough No nasal congestion, rhinorrhea, postnasal drip No Myalgias + Headache for 3 days.  Slightly different than her usual migraines, but is not the worst headache that she is ever had. No Rash  No loss of taste or smell No shortness of breath or difficulty breathing + nausea, no vomiting No diarrhea + Periumbilical pain described as "feeling bloated"     No Recent Strep, COVID exposure, but was around a sick child 4 days ago.  Does not know what illness the child has. No reflux sxs No Allergy sxs  No Breathing difficulty, voice changes, sensation of throat swelling shut No Drooling No Trismus No abx in past month.  She has gotten the COVID booster No antipyretic in past 4-6 hrs  She has a past medical history of chronic migraines, rheumatoid arthritis on Plaquenil, fibromyalgia, asthma, GERD.  No history of COVID, frequent strep, mono. PMD: Merrilee Seashore, MD    Past Medical History:  Diagnosis Date   Acid reflux    Asthma    Disturbance of skin sensation    Fibromyalgia    Migraine    Uterine fibroid     Past Surgical History:  Procedure Laterality Date   CARPAL TUNNEL RELEASE     CYSTECTOMY     HAND SURGERY Right    SHOULDER SURGERY Right     Family History  Problem Relation Age of Onset   Hypertension Mother    Arrhythmia Mother    Thyroid disease Mother    Pneumonia Father    Diabetes Maternal Grandmother    Kidney disease Sister    Multiple sclerosis Cousin     Social History   Tobacco Use   Smoking status: Never   Smokeless tobacco: Never  Vaping Use   Vaping Use: Never used  Substance Use Topics   Alcohol use: No   Drug use: No    No current facility-administered medications for this encounter.  Current  Outpatient Medications:    fluticasone (FLONASE) 50 MCG/ACT nasal spray, Place 2 sprays into both nostrils daily., Disp: 16 g, Rfl: 0   ibuprofen (ADVIL) 600 MG tablet, Take 1 tablet (600 mg total) by mouth every 6 (six) hours as needed., Disp: 30 tablet, Rfl: 0   albuterol (PROVENTIL HFA;VENTOLIN HFA) 108 (90 BASE) MCG/ACT inhaler, Inhale 2 puffs into the lungs every 6 (six) hours as needed for wheezing or shortness of breath., Disp: 1 Inhaler, Rfl: 2   amitriptyline (ELAVIL) 25 MG tablet, Take 1 tablet (25 mg total) by mouth at bedtime., Disp: 90 tablet, Rfl: 4   esomeprazole (NEXIUM) 40 MG capsule, TAKE ONE CAPSULE BY MOUTH TWICE DAILY, 30 TO 60 MINUTES BEFORE FIRST AND LAST MEAL OF THE DAY (Patient taking differently: Take 40 mg by mouth 2 (two) times daily before a meal.), Disp: 180 capsule, Rfl: 0   hydroxychloroquine (PLAQUENIL) 200 MG tablet, Take 400 mg by mouth daily. , Disp: , Rfl:    meclizine (ANTIVERT) 25 MG tablet, Take 1 tablet (25 mg total) by mouth 3 (three) times daily as needed for dizziness., Disp: 12 tablet, Rfl: 0   montelukast (SINGULAIR) 10 MG tablet, Take 10 mg by mouth at bedtime. , Disp: , Rfl:    ondansetron (ZOFRAN ODT) 4 MG disintegrating  tablet, Take 1 tablet (4 mg total) by mouth every 8 (eight) hours as needed., Disp: 20 tablet, Rfl: 6   pregabalin (LYRICA) 200 MG capsule, Take 200 mg by mouth 2 (two) times daily., Disp: , Rfl:    rizatriptan (MAXALT-MLT) 10 MG disintegrating tablet, Take 1 tab at onset of migraine.  May repeat in 2 hrs, if needed.  Max dose: 2 tabs/day. This is a 30 day prescription., Disp: 10 tablet, Rfl: 6   sucralfate (CARAFATE) 1 G tablet, Take 1 tablet (1 g total) by mouth 4 (four) times daily. (Patient taking differently: Take 1 g by mouth daily.), Disp: 20 tablet, Rfl: 0   SUMAtriptan 6 MG/0.5ML SOAJ, Take 1 injection at onset of migraine.  May repeat in 2 hrs, if needed.  Max dose: 2 injections/day. This is a 30 day prescription., Disp: 5 mL,  Rfl: 6   tiZANidine (ZANAFLEX) 4 MG tablet, Take 4 mg by mouth every 6 (six) hours as needed for muscle spasms., Disp: , Rfl:    zonisamide (ZONEGRAN) 100 MG capsule, Take 1 capsule (100 mg total) by mouth 2 (two) times daily. Pls call [phone] to schedule appt to continue refills or request from PCP., Disp: 180 capsule, Rfl: 4  Allergies  Allergen Reactions   Amoxicillin     Yeast infections     ROS  As noted in HPI.   Physical Exam  BP 124/72   Pulse 87   Temp (!) 100.7 F (38.2 C) (Oral)   Resp 18   SpO2 100%   Constitutional: Well developed, well nourished, no acute distress Eyes:  EOMI, conjunctiva normal bilaterally HENT: Normocephalic, atraumatic,mucus membranes moist. + nasal congestion + slightly erythematous oropharynx . - enlarged tonsils - exudates. Uvula midline.  Extensive postnasal drip. Respiratory: Normal inspiratory effort, lungs clear bilaterally Cardiovascular: Normal rate, no murmurs, rubs, gallops GI: nondistended, nontender. No appreciable splenomegaly skin: No rash, skin intact Lymph: - Anterior cervical LN.  No posterior cervical lymphadenopathy Musculoskeletal: no deformities Neurologic: Alert & oriented x 3, no focal neuro deficits Psychiatric: Speech and behavior appropriate.   ED Course   Medications  acetaminophen (TYLENOL) tablet 650 mg (650 mg Oral Given 02/14/21 1448)  ketorolac (TORADOL) 30 MG/ML injection 30 mg (30 mg Intramuscular Given 02/14/21 1558)    Orders Placed This Encounter  Procedures   SARS CORONAVIRUS 2 (TAT 6-24 HRS) Nasopharyngeal Nasopharyngeal Swab    Standing Status:   Standing    Number of Occurrences:   1    Order Specific Question:   Is this test for diagnosis or screening    Answer:   Diagnosis of ill patient    Order Specific Question:   Symptomatic for COVID-19 as defined by CDC    Answer:   Yes    Order Specific Question:   Date of Symptom Onset    Answer:   02/11/2021    Order Specific Question:    Hospitalized for COVID-19    Answer:   No    Order Specific Question:   Admitted to ICU for COVID-19    Answer:   No    Order Specific Question:   Previously tested for COVID-19    Answer:   No    Order Specific Question:   Resident in a congregate (group) care setting    Answer:   No    Order Specific Question:   Employed in healthcare setting    Answer:   No    Order Specific Question:  Pregnant    Answer:   No    Order Specific Question:   Has patient completed COVID vaccination(s) (2 doses of Pfizer/Moderna 1 dose of [employer])    Answer:   Yes    Order Specific Question:   Has patient completed COVID Booster / 3rd dose    Answer:   Yes   Basic metabolic panel    Standing Status:   Standing    Number of Occurrences:   1   POCT Rapid Strep A    Standing Status:   Standing    Number of Occurrences:   1    Results for orders placed or performed during the hospital encounter of 02/14/21 (from the past 24 hour(s))  SARS CORONAVIRUS 2 (TAT 6-24 HRS) Nasopharyngeal Nasopharyngeal Swab     Status: Abnormal   Collection Time: 02/14/21  3:10 PM   Specimen: Nasopharyngeal Swab  Result Value Ref Range   SARS Coronavirus 2 POSITIVE (A) NEGATIVE  Basic metabolic panel     Status: Abnormal   Collection Time: 02/14/21  3:20 PM  Result Value Ref Range   Sodium 134 (L) 135 - 145 mmol/L   Potassium 3.9 3.5 - 5.1 mmol/L   Chloride 101 98 - 111 mmol/L   CO2 24 22 - 32 mmol/L   Glucose, Bld 88 70 - 99 mg/dL   BUN 11 6 - 20 mg/dL   Creatinine, Ser 0.97 0.44 - 1.00 mg/dL   Calcium 9.2 8.9 - 10.3 mg/dL   GFR, Estimated >60 >60 mL/min   Anion gap 9 5 - 15  POCT Rapid Strep A     Status: None   Collection Time: 02/14/21  3:35 PM  Result Value Ref Range   Streptococcus, Group A Screen (Direct) NEGATIVE NEGATIVE   No results found. Results for orders placed or performed during the hospital encounter of 02/14/21  SARS CORONAVIRUS 2 (TAT 6-24 HRS) Nasopharyngeal Nasopharyngeal Swab    Specimen: Nasopharyngeal Swab  Result Value Ref Range   SARS Coronavirus 2 POSITIVE (A) NEGATIVE  Basic metabolic panel  Result Value Ref Range   Sodium 134 (L) 135 - 145 mmol/L   Potassium 3.9 3.5 - 5.1 mmol/L   Chloride 101 98 - 111 mmol/L   CO2 24 22 - 32 mmol/L   Glucose, Bld 88 70 - 99 mg/dL   BUN 11 6 - 20 mg/dL   Creatinine, Ser 0.97 0.44 - 1.00 mg/dL   Calcium 9.2 8.9 - 10.3 mg/dL   GFR, Estimated >60 >60 mL/min   Anion gap 9 5 - 15  POCT Rapid Strep A  Result Value Ref Range   Streptococcus, Group A Screen (Direct) NEGATIVE NEGATIVE     ED Clinical Impression  1. COVID-19 virus infection   2. Acute pharyngitis, unspecified etiology   3. Encounter for laboratory testing for COVID-19 virus      ED Assessment/Plan  Checking strep, COVID, BMP.  Patient will be a candidate for Paxlovid if positive due to the asthma and immunocompromise.  Rapid strep negative. Obtaining throat culture to guide antibiotic treatment. Discussed this with patient/ parent. We'll contact them if culture is positive, and will call in Appropriate antibiotics. Patient home with ibuprofen, Tylenol, Benadryl/Maalox mixture, Flonase, saline nasal irrigation, continue Mucinex.  Patient to followup with PMD when necessary  COVID positive. GFR >60. Will rx Paxlovid to pharmacy on record.  See telephone note.  Patient requesting Toradol shot for headache.  States that it usually helps.  This  is not the worst headache of her life, she is neurologically intact, will give her 30 mg Toradol IM x1.  Discussed labs,  MDM, plan and followup with patient. Discussed sn/sx that should prompt return to the ED. patient agrees with plan.   Meds ordered this encounter  Medications   acetaminophen (TYLENOL) tablet 650 mg   fluticasone (FLONASE) 50 MCG/ACT nasal spray    Sig: Place 2 sprays into both nostrils daily.    Dispense:  16 g    Refill:  0   ibuprofen (ADVIL) 600 MG tablet    Sig: Take 1 tablet (600 mg  total) by mouth every 6 (six) hours as needed.    Dispense:  30 tablet    Refill:  0   ketorolac (TORADOL) 30 MG/ML injection 30 mg     *This clinic note was created using [employer]. Therefore, there may be occasional mistakes despite careful proofreading.    Melynda Ripple, MD 02/15/21 1310

## 2021-02-14 NOTE — Discharge Instructions (Addendum)
your rapid strep was negative today, so we have sent off a throat culture.  We will contact you and call in the appropriate antibiotics if your culture comes back positive for an infection requiring antibiotic treatment.  Give [place] a working phone number.    We will contact you if your COVID comes back positive and will prescribe Paxlovid in that case.  COVID test should be back in 6-24 hours.  1 gram of Tylenol and 600 mg ibuprofen together 3-4 times a day as needed for pain.  Make sure you drink plenty of extra fluids.  Some people find salt water gargles and  Tr[employer]'s "Throat Coat" tea helpful. Take 5 mL of liquid Benadryl and 5 mL of Maalox. Mix it together, and then hold it in your mouth for as long as you can and then swallow. You may do this 4 times a day.    Go to www.goodrx.com  or www.costplusdrugs.com to look up your medications. This will give you a list of where you can find your prescriptions at the most affordable prices. Or ask the pharmacist what the cash price is, or if they have any other discount programs available to help make your medication more affordable. This can be less expensive than what you would pay with insurance.

## 2021-02-15 ENCOUNTER — Telehealth (HOSPITAL_COMMUNITY): Payer: Self-pay | Admitting: Emergency Medicine

## 2021-02-15 DIAGNOSIS — U071 COVID-19: Secondary | ICD-10-CM

## 2021-02-15 LAB — SARS CORONAVIRUS 2 (TAT 6-24 HRS): SARS Coronavirus 2: POSITIVE — AB

## 2021-02-15 MED ORDER — NIRMATRELVIR/RITONAVIR (PAXLOVID)TABLET: 3.0000 | ORAL_TABLET | Freq: Two times a day (BID) | ORAL | 0 refills | Status: AC

## 2021-02-15 NOTE — Telephone Encounter (Signed)
Patient COVID-positive.  GFR above 60.  Prescribing Paxlovid to pharmacy on record.  Patient was notified by lab nurse.

## 2021-03-04 ENCOUNTER — Other Ambulatory Visit: Payer: Self-pay | Admitting: Neurology

## 2021-03-19 DIAGNOSIS — G43909 Migraine, unspecified, not intractable, without status migrainosus: Secondary | ICD-10-CM | POA: Diagnosis not present

## 2021-03-19 DIAGNOSIS — H35371 Puckering of macula, right eye: Secondary | ICD-10-CM | POA: Diagnosis not present

## 2021-03-19 DIAGNOSIS — H43811 Vitreous degeneration, right eye: Secondary | ICD-10-CM | POA: Diagnosis not present

## 2021-03-19 DIAGNOSIS — R7309 Other abnormal glucose: Secondary | ICD-10-CM | POA: Diagnosis not present

## 2021-03-26 DIAGNOSIS — J358 Other chronic diseases of tonsils and adenoids: Secondary | ICD-10-CM | POA: Diagnosis not present

## 2021-03-26 DIAGNOSIS — J392 Other diseases of pharynx: Secondary | ICD-10-CM | POA: Insufficient documentation

## 2021-03-27 ENCOUNTER — Encounter: Payer: Self-pay | Admitting: Acute Care

## 2021-03-27 ENCOUNTER — Ambulatory Visit (INDEPENDENT_AMBULATORY_CARE_PROVIDER_SITE_OTHER): Payer: Medicare Other | Admitting: Acute Care

## 2021-03-27 ENCOUNTER — Other Ambulatory Visit: Payer: Self-pay

## 2021-03-27 VITALS — BP 118/74 | HR 86 | Temp 97.3°F | Ht 63.0 in | Wt 134.7 lb

## 2021-03-27 DIAGNOSIS — U099 Post covid-19 condition, unspecified: Secondary | ICD-10-CM

## 2021-03-27 DIAGNOSIS — R058 Other specified cough: Secondary | ICD-10-CM | POA: Diagnosis not present

## 2021-03-27 MED ORDER — PREDNISONE 10 MG PO TABS: ORAL_TABLET | ORAL | 0 refills | Status: AC

## 2021-03-27 NOTE — Progress Notes (Signed)
History of Present Illness Kerry Ortiz is a 55 y.o. female never smoker with upper airway cough syndrome. Last seen in the office 07/2019 by Dr. Melvyn Novas.   Synopsis Pt.  had Covid 19 diagnosis 02/19/2021. She has had cough and throat irritation since. She is here for follow up.    03/27/2021 Pt. Presents for follow up. She states she still has a cough. She feels a constant tickle in the back of her throat. She states she does have reflux. She is on Nexium. She is compliant with this. She was also using the chlortabs. She says they do help a bit. She is using her flonase daily. She is not using Zyrtec or Xyzol. We have discussed adding this. She is in no distress today. She has not coughed while in the exam room. She denies fever, chest pain, orthopnea of hemoptysis.  Test Results: August 23/2022 Preoperative Diagnosis: Throat irritation Postoperative Diagnosis: same Procedure: Transnasal fiberoptic laryngoscopy Anesthesia: Topical with Afrin and Xylocaine Complications: None EBL: None  Informed consent was obtainted including a discussion of risks, benefits, and alternatives. The patient was placed in a sitting position and the nasal passages sprayed with a combination of Afrin and Xyolcaine. The fiberoptic laryngoscope was then placed through the nasal passage to view the pharynx and larynx. After completion, the telescope was removed. Findings included normal nasal passages, no mass or abnormality in the nasopharynx, and no mass or ulceration in the pharynx or larynx. Pyriform sinuses are open. Secretions are minimal. Vocal folds are without mass, scarring, or ulceration. The vocal folds adduct and abduct symmetrically. There is good glottal closure. Muscle tension patterns are not present. Laryngeal edema is minimal.  Impression & Plans:  Kerry Ortiz is a 55 y.o. female with throat irritation.  - Fiberoptic exam of the pharynx and larynx is unremarkable and she was reassured. We discussed  possible reasons for the symptom but agreed to observe.  CBC Latest Ref Rng & Units 03/22/2020 03/22/2020 03/16/2018  WBC 4.0 - 10.5 K/uL - 4.3 7.2  Hemoglobin 12.0 - 15.0 g/dL 15.0 13.5 12.6  Hematocrit 36.0 - 46.0 % 44.0 44.3 41.3  Platelets 150 - 400 K/uL - 307 361    BMP Latest Ref Rng & Units 02/14/2021 03/22/2020 03/22/2020  Glucose 70 - 99 mg/dL 88 82 88  BUN 6 - 20 mg/dL '11 15 12  '$ Creatinine 0.44 - 1.00 mg/dL 0.97 0.90 0.95  Sodium 135 - 145 mmol/L 134(L) 142 142  Potassium 3.5 - 5.1 mmol/L 3.9 4.3 3.6  Chloride 98 - 111 mmol/L 101 108 108  CO2 22 - 32 mmol/L 24 - 23  Calcium 8.9 - 10.3 mg/dL 9.2 - 9.4    BNP No results found for: BNP  ProBNP No results found for: PROBNP  PFT    Component Value Date/Time   FEV1PRE 1.96 12/15/2017 1355   FEV1POST 2.07 12/15/2017 1355   FVCPRE 2.46 12/15/2017 1355   FVCPOST 2.51 12/15/2017 1355   PREFEV1FVCRT 80 12/15/2017 1355   PSTFEV1FVCRT 82 12/15/2017 1355    No results found.   Past medical hx Past Medical History:  Diagnosis Date   Acid reflux    Asthma    Disturbance of skin sensation    Fibromyalgia    Migraine    Uterine fibroid      Social History   Tobacco Use   Smoking status: Never   Smokeless tobacco: Never  Vaping Use   Vaping Use: Never used  Substance Use Topics  Alcohol use: No   Drug use: No    Kerry Ortiz reports that she has never smoked. She has never used smokeless tobacco. She reports that she does not drink alcohol and does not use drugs.  Tobacco Cessation: Never smoker   Past surgical hx, Family hx, Social hx all reviewed.  Current Outpatient Medications on File Prior to Visit  Medication Sig   albuterol (PROVENTIL HFA;VENTOLIN HFA) 108 (90 BASE) MCG/ACT inhaler Inhale 2 puffs into the lungs every 6 (six) hours as needed for wheezing or shortness of breath.   amitriptyline (ELAVIL) 25 MG tablet Take 1 tablet (25 mg total) by mouth at bedtime.   esomeprazole (NEXIUM) 40 MG capsule  TAKE ONE CAPSULE BY MOUTH TWICE DAILY, 30 TO 60 MINUTES BEFORE FIRST AND LAST MEAL OF THE DAY (Patient taking differently: Take 40 mg by mouth 2 (two) times daily before a meal.)   fluticasone (FLONASE) 50 MCG/ACT nasal spray Place 2 sprays into both nostrils daily.   hydroxychloroquine (PLAQUENIL) 200 MG tablet Take 400 mg by mouth daily.    ibuprofen (ADVIL) 600 MG tablet Take 1 tablet (600 mg total) by mouth every 6 (six) hours as needed.   meclizine (ANTIVERT) 25 MG tablet Take 1 tablet (25 mg total) by mouth 3 (three) times daily as needed for dizziness.   montelukast (SINGULAIR) 10 MG tablet Take 10 mg by mouth at bedtime.    ondansetron (ZOFRAN ODT) 4 MG disintegrating tablet Take 1 tablet (4 mg total) by mouth every 8 (eight) hours as needed.   pregabalin (LYRICA) 200 MG capsule Take 200 mg by mouth 2 (two) times daily.   rizatriptan (MAXALT-MLT) 10 MG disintegrating tablet Take 1 tab at onset of migraine.  May repeat in 2 hrs, if needed.  Max dose: 2 tabs/day. This is a 30 day prescription.   sucralfate (CARAFATE) 1 G tablet Take 1 tablet (1 g total) by mouth 4 (four) times daily. (Patient taking differently: Take 1 g by mouth daily.)   SUMAtriptan 6 MG/0.5ML SOAJ Take 1 injection at onset of migraine.  May repeat in 2 hrs, if needed.  Max dose: 2 injections/day. This is a 30 day prescription.   tiZANidine (ZANAFLEX) 4 MG tablet Take 4 mg by mouth every 6 (six) hours as needed for muscle spasms.   zonisamide (ZONEGRAN) 100 MG capsule Take 1 capsule (100 mg total) by mouth 2 (two) times daily. Pls call [phone] to schedule appt to continue refills or request from PCP.   No current facility-administered medications on file prior to visit.     Allergies  Allergen Reactions   Amoxicillin     Yeast infections    Review Of Systems:  Constitutional:   No  weight loss, night sweats,  Fevers, chills, fatigue, or  lassitude.  HEENT:   No headaches,  Difficulty swallowing,  Tooth/dental  problems, or  Sore throat,                No sneezing, itching, ear ache, nasal congestion, post nasal drip,   CV:  No chest pain,  Orthopnea, PND, swelling in lower extremities, anasarca, dizziness, palpitations, syncope.   GI  No heartburn, indigestion, abdominal pain, nausea, vomiting, diarrhea, change in bowel habits, loss of appetite, bloody stools.   Resp: No shortness of breath with exertion or at rest.  No excess mucus, no productive cough,  + non-productive cough,  No coughing up of blood.  No change in color of mucus.  No wheezing.  No  chest wall deformity  Skin: no rash or lesions.  GU: no dysuria, change in color of urine, no urgency or frequency.  No flank pain, no hematuria   MS:  No joint pain or swelling.  No decreased range of motion.  No back pain.  Psych:  No change in mood or affect. No depression or anxiety.  No memory loss.   Vital Signs BP 118/74 (BP Location: Left Arm, Cuff Size: Normal)   Pulse 86   Temp (!) 97.3 F (36.3 C) (Oral)   Ht '5\' 3"'$  (1.6 m)   Wt 134 lb 11.2 oz (61.1 kg)   SpO2 100%   BMI 23.86 kg/m    Physical Exam:  General- No distress,  A&O x 3, pleasant ENT: No sinus tenderness, TM clear, pale nasal mucosa, no oral exudate,no post nasal drip, no LAN Cardiac: S1, S2, regular rate and rhythm, no murmur Chest: No wheeze/ rales/ dullness; no accessory muscle use, no nasal flaring, no sternal retractions Abd.: Soft Non-tender, ND, BS +, Body mass index is 23.86 kg/m.  Ext: No clubbing cyanosis, edema Neuro:  normal strength, MAE x 4, A&O x 3 Skin: No rashes, warm and dry, No Lesions Psych: normal mood and behavior   Assessment/Plan Upper Airway Cough Post Covid 19 Diagnosed 02/19/2021 Plan We will send in a prescription for a prednisone taper. Prednisone taper; 10 mg tablets: 4 tabs x 2 days, 3 tabs x 2 days, 2 tabs x 2 days 1 tab x 2 days then stop.  Continue Nexium as you have been doing.  We will give you a copy of the GERD diet.   You can add pepcid 20 mg at bedtime ( Generic is famotidine)  Sleep with HOB elevated.  Continue the Flonase as you have been doing. Continue chlor tabs as you have been doing. Sips of water instead of throat clearing Sugar Free [employer] or [employer] originals for throat soothing. Delsym Cough syrup 5 cc's every 12 hours Non-sedating antihistamine of your choice daily ( Zyrtec, Allegra, Xyzol, Claritin ( Generic ok)  Please contact office for sooner follow up if symptoms do not improve or worsen or seek emergency care   [place] up in 1 month to ensure you are getting better. Call if you need to be seen sooner.  Please contact office for sooner follow up if symptoms do not improve or worsen or seek emergency care    I spent 30 minutes dedicated to the care of this patient on the date of this encounter to include pre-visit review of records, face-to-face time with the patient discussing conditions above, post visit ordering of testing, clinical documentation with the electronic health record, making appropriate referrals as documented, and communicating necessary information to the patient's healthcare team.   Magdalen Spatz, NP 03/27/2021  4:30 PM

## 2021-03-27 NOTE — Patient Instructions (Addendum)
It is good to see you today. We will send in a prescription for a prednisone taper. Prednisone taper; 10 mg tablets: 4 tabs x 2 days, 3 tabs x 2 days, 2 tabs x 2 days 1 tab x 2 days then stop.  Continue Nexium as you have been doing.  We will give you a copy of the GERD diet.  You can add pepcid 20 mg at bedtime ( Generic is famotidine)  Sleep with HOB elevated.  Continue the Flonase as you have been doing. Continue chlor tabs as you have been doing. Sips of water instead of throat clearing Sugar Free [employer] or [employer] originals for throat soothing. Delsym Cough syrup 5 cc's every 12 hours Non-sedating antihistamine of your choice daily ( Zyrtec, Allegra, Xyzol, Claritin ( Generic ok)  Please contact office for sooner follow up if symptoms do not improve or worsen or seek emergency care   Folow up in 1 month to ensure you are getting better. Call if you need to be seen sooner.  Please contact office for sooner follow up if symptoms do not improve or worsen or seek emergency care

## 2021-03-28 DIAGNOSIS — R5383 Other fatigue: Secondary | ICD-10-CM | POA: Diagnosis not present

## 2021-03-28 DIAGNOSIS — M2550 Pain in unspecified joint: Secondary | ICD-10-CM | POA: Diagnosis not present

## 2021-03-28 DIAGNOSIS — M79643 Pain in unspecified hand: Secondary | ICD-10-CM | POA: Diagnosis not present

## 2021-03-28 DIAGNOSIS — M797 Fibromyalgia: Secondary | ICD-10-CM | POA: Diagnosis not present

## 2021-03-29 ENCOUNTER — Encounter (HOSPITAL_COMMUNITY): Payer: Self-pay | Admitting: *Deleted

## 2021-03-29 ENCOUNTER — Other Ambulatory Visit: Payer: Self-pay

## 2021-03-29 ENCOUNTER — Ambulatory Visit (HOSPITAL_COMMUNITY)
Admission: EM | Admit: 2021-03-29 | Discharge: 2021-03-29 | Disposition: A | Discharge status: Home / Self Care | Payer: Medicare Other | Attending: Student | Admitting: Student

## 2021-03-29 DIAGNOSIS — K2100 Gastro-esophageal reflux disease with esophagitis, without bleeding: Secondary | ICD-10-CM | POA: Insufficient documentation

## 2021-03-29 LAB — POCT RAPID STREP A, ED / UC: Streptococcus, Group A Screen (Direct): NEGATIVE

## 2021-03-29 MED ORDER — LIDOCAINE VISCOUS HCL 2 % MT SOLN
OROMUCOSAL | Status: AC
  Filled 2021-03-29: qty 15

## 2021-03-29 MED ORDER — LIDOCAINE VISCOUS HCL 2 % MT SOLN
15.0000 mL | Freq: Once | OROMUCOSAL | Status: AC
  Administered 2021-03-29: 15 mL via ORAL

## 2021-03-29 MED ORDER — ALUM & MAG HYDROXIDE-SIMETH 200-200-20 MG/5ML PO SUSP
30.0000 mL | Freq: Once | ORAL | Status: AC
  Administered 2021-03-29: 30 mL via ORAL

## 2021-03-29 MED ORDER — ALUM & MAG HYDROXIDE-SIMETH 200-200-20 MG/5ML PO SUSP
ORAL | Status: AC
  Filled 2021-03-29: qty 30

## 2021-03-29 NOTE — ED Triage Notes (Signed)
Pt presents with burning in chest up to throat. Pt has a HX of GERD and takes med for GERD

## 2021-03-29 NOTE — ED Provider Notes (Signed)
[place]    CSN: [id number] Arrival date & time: 03/29/21  1608      History   Chief Complaint Chief Complaint  Patient presents with   Chest Pain    HPI Kerry Ortiz is a 55 y.o. female presenting with burning sensation in her chest for about 2 days.  Medical history GERD, asthma, fibromyalgia.  Patient states that it feels like there is burning going up the center of her chest radiating from her stomach to her throat for about 2 days.  Has not identified triggers or relieving factors for the symptoms, she is still taking Nexium as directed.  Denies recent spicy food, NSAIDs, alcohol, coffee, etc.  Denies left-sided chest pain, dizziness, shortness of breath, weakness, headaches.  HPI  Past Medical History:  Diagnosis Date   Acid reflux    Asthma    Disturbance of skin sensation    Fibromyalgia    Migraine    Uterine fibroid     Patient Active Problem List   Diagnosis Date Noted   Vision changes 01/16/2021   Chronic migraine w/o aura w/o status migrainosus, not intractable 06/26/2020   Chronic right-sided low back pain with right-sided sciatica 06/26/2020   Atypical chest pain 01/06/2020   Diffuse pain 02/08/2019   Right lumbar radiculopathy 02/08/2019   Upper airway cough syndrome 11/03/2017   Leg weakness 05/07/2017   Hypokalemia 05/07/2017   Anemia 05/07/2017   Vertigo 05/07/2017   Chronic migraine 04/23/2017   Paresthesia 02/07/2016   ABDOMINAL BLOATING 11/08/2008   EPIGASTRIC PAIN 11/08/2008   DEPRESSION 10/31/2008   OSTEOARTHRITIS 10/31/2008   FIBROMYALGIA 10/31/2008   HEADACHE, CHRONIC 10/31/2008   ABDOMINAL PAIN, RECURRENT 10/31/2008   CARPAL TUNNEL SYNDROME, HX OF 10/31/2008    Past Surgical History:  Procedure Laterality Date   CARPAL TUNNEL RELEASE     CYSTECTOMY     HAND SURGERY Right    SHOULDER SURGERY Right     OB History   No obstetric history on file.      Home Medications    Prior to Admission medications    Medication Sig Start Date End Date Taking? Authorizing Provider  albuterol (PROVENTIL HFA;VENTOLIN HFA) 108 (90 BASE) MCG/ACT inhaler Inhale 2 puffs into the lungs every 6 (six) hours as needed for wheezing or shortness of breath. 10/24/14   Gregor Hams, MD  amitriptyline (ELAVIL) 25 MG tablet Take 1 tablet (25 mg total) by mouth at bedtime. 01/16/21   Suzzanne Cloud, NP  esomeprazole (NEXIUM) 40 MG capsule TAKE ONE CAPSULE BY MOUTH TWICE DAILY, 30 TO 60 MINUTES BEFORE FIRST AND LAST MEAL OF THE DAY Patient taking differently: Take 40 mg by mouth 2 (two) times daily before a meal. 09/13/19   Tanda Rockers, MD  fluticasone (FLONASE) 50 MCG/ACT nasal spray Place 2 sprays into both nostrils daily. 02/14/21   Melynda Ripple, MD  hydroxychloroquine (PLAQUENIL) 200 MG tablet Take 400 mg by mouth daily.     [provider]  ibuprofen (ADVIL) 600 MG tablet Take 1 tablet (600 mg total) by mouth every 6 (six) hours as needed. 02/14/21   Melynda Ripple, MD  meclizine (ANTIVERT) 25 MG tablet Take 1 tablet (25 mg total) by mouth 3 (three) times daily as needed for dizziness. 03/23/20   Palumbo, April, MD  montelukast (SINGULAIR) 10 MG tablet Take 10 mg by mouth at bedtime.  04/28/19   [provider]  ondansetron (ZOFRAN ODT) 4 MG disintegrating tablet Take 1 tablet (  4 mg total) by mouth every 8 (eight) hours as needed. 01/16/21   Suzzanne Cloud, NP  predniSONE (DELTASONE) 10 MG tablet Take 4 tablets (40 mg total) by mouth daily with breakfast for 2 days, THEN 3 tablets (30 mg total) daily with breakfast for 2 days, THEN 2 tablets (20 mg total) daily with breakfast for 2 days, THEN 1 tablet (10 mg total) daily with breakfast for 2 days. 03/27/21 04/04/21  Magdalen Spatz, NP  pregabalin (LYRICA) 200 MG capsule Take 200 mg by mouth 2 (two) times daily.    [provider]  rizatriptan (MAXALT-MLT) 10 MG disintegrating tablet Take 1 tab at onset of migraine.  May repeat in 2 hrs, if needed.   Max dose: 2 tabs/day. This is a 30 day prescription. 01/16/21   Suzzanne Cloud, NP  sucralfate (CARAFATE) 1 G tablet Take 1 tablet (1 g total) by mouth 4 (four) times daily. Patient taking differently: Take 1 g by mouth daily. 07/11/12   Malvin Johns, MD  SUMAtriptan 6 MG/0.5ML SOAJ Take 1 injection at onset of migraine.  May repeat in 2 hrs, if needed.  Max dose: 2 injections/day. This is a 30 day prescription. 02/07/21   Marcial Pacas, MD  tiZANidine (ZANAFLEX) 4 MG tablet Take 4 mg by mouth every 6 (six) hours as needed for muscle spasms.    [provider]  zonisamide (ZONEGRAN) 100 MG capsule Take 1 capsule (100 mg total) by mouth 2 (two) times daily. Pls call [phone] to schedule appt to continue refills or request from PCP. 01/16/21   Suzzanne Cloud, NP    Family History Family History  Problem Relation Age of Onset   Hypertension Mother    Arrhythmia Mother    Thyroid disease Mother    Pneumonia Father    Diabetes Maternal Grandmother    Kidney disease Sister    Multiple sclerosis Cousin     Social History Social History   Tobacco Use   Smoking status: Never   Smokeless tobacco: Never  Vaping Use   Vaping Use: Never used  Substance Use Topics   Alcohol use: No   Drug use: No     Allergies   Amoxicillin   Review of Systems Review of Systems  Gastrointestinal:        Esophageal burning    Physical Exam Triage Vital Signs ED Triage Vitals  Enc Vitals Group     BP 03/29/21 1630 123/80     Pulse Rate 03/29/21 1630 75     Resp 03/29/21 1630 20     Temp 03/29/21 1630 98.2 F (36.8 C)     Temp src --      SpO2 03/29/21 1630 99 %     Weight --      Height --      Head Circumference --      Peak Flow --      Pain Score 03/29/21 1626 8     Pain Loc --      Pain Edu? --      Excl. in [employer]? --    No data found.  Updated Vital Signs BP 123/80   Pulse 75   Temp 98.2 F (36.8 C)   Resp 20   LMP  (LMP Unknown) Comment: pt states before 2020  SpO2 99%    Visual Acuity Right Eye Distance:   Left Eye Distance:   Bilateral Distance:    Right Eye Near:   Left Eye Near:  Bilateral Near:     Physical Exam Vitals reviewed.  Constitutional:      Appearance: Normal appearance. She is not diaphoretic.  HENT:     Head: Normocephalic and atraumatic.     Mouth/Throat:     Mouth: Mucous membranes are moist.  Eyes:     Extraocular Movements: Extraocular movements intact.     Pupils: Pupils are equal, round, and reactive to light.  Cardiovascular:     Rate and Rhythm: Normal rate and regular rhythm.     Pulses:          Radial pulses are 2+ on the right side and 2+ on the left side.     Heart sounds: Normal heart sounds.  Pulmonary:     Effort: Pulmonary effort is normal.     Breath sounds: Normal breath sounds.  Abdominal:     Palpations: Abdomen is soft.     Tenderness: There is no abdominal tenderness. There is no guarding or rebound.  Musculoskeletal:     Right lower leg: No edema.     Left lower leg: No edema.  Skin:    General: Skin is warm.     Capillary Refill: Capillary refill takes less than 2 seconds.  Neurological:     General: No focal deficit present.     Mental Status: She is alert and oriented to person, place, and time.  Psychiatric:        Mood and Affect: Mood normal.        Behavior: Behavior normal.        Thought Content: Thought content normal.        Judgment: Judgment normal.     UC Treatments / Results  Labs (all labs ordered are listed, but only abnormal results are displayed) Labs Reviewed  CULTURE, GROUP A STREP [facility])  POCT RAPID STREP A, ED / UC    EKG   Radiology No results found.  Procedures Procedures (including critical care time)  Medications Ordered in UC Medications  alum & mag hydroxide-simeth (MAALOX/MYLANTA) 200-200-20 MG/5ML suspension 30 mL (30 mLs Oral Given 03/29/21 1729)    And  lidocaine (XYLOCAINE) 2 % viscous mouth solution 15 mL (15 mLs Oral Given 03/29/21  1729)    Initial Impression / Assessment and Plan / UC Course  I have reviewed the triage vital signs and the nursing notes.  Pertinent labs & imaging results that were available during my care of the patient were reviewed by me and considered in my medical decision making (see chart for details).     This patient is a very pleasant 55 y.o. year old female presenting with esophagitis due to GERD. This patient does have a long history of GERD, which is typically well controlled on Nexium, which she is taking as directed.   Rapid strep negative, culture sent. EKG NSR, unchanged from 2021 EKG.   GI cocktail administered with improvement in symptoms.  ED return precautions discussed. Patient verbalizes understanding and agreement.   Coding Level 4 for review of past notes/labs, order and interpretation of labs today, and prescription drug management    Final Clinical Impressions(s) / UC Diagnoses   Final diagnoses:  Gastroesophageal reflux disease with esophagitis without hemorrhage     Discharge Instructions      Continue Nexium Avoid spicy food, NSAIDs, alcohol, coffee, etc. Follow-up with PCP if symptoms persist Head to the emergency department if new symptoms like left-sided chest pain, shortness of breath, dizziness, weakness.     ED  Prescriptions   None    PDMP not reviewed this encounter.   Hazel Sams, PA-C 03/29/21 1732

## 2021-03-29 NOTE — Discharge Instructions (Addendum)
Continue Nexium Avoid spicy food, NSAIDs, alcohol, coffee, etc. Follow-up with PCP if symptoms persist Head to the emergency department if new symptoms like left-sided chest pain, shortness of breath, dizziness, weakness.

## 2021-04-01 DIAGNOSIS — Z1231 Encounter for screening mammogram for malignant neoplasm of breast: Secondary | ICD-10-CM | POA: Diagnosis not present

## 2021-04-01 LAB — CULTURE, GROUP A STREP (THRC)

## 2021-05-01 ENCOUNTER — Other Ambulatory Visit: Payer: Self-pay

## 2021-05-01 ENCOUNTER — Ambulatory Visit (INDEPENDENT_AMBULATORY_CARE_PROVIDER_SITE_OTHER): Payer: Medicare Other | Admitting: Acute Care

## 2021-05-01 ENCOUNTER — Encounter: Payer: Self-pay | Admitting: Acute Care

## 2021-05-01 VITALS — BP 118/72 | HR 73 | Temp 97.3°F | Ht 62.0 in | Wt 133.4 lb

## 2021-05-01 DIAGNOSIS — R058 Other specified cough: Secondary | ICD-10-CM

## 2021-05-01 NOTE — Patient Instructions (Signed)
Im glad your cough is better.  Continue Nexium as you have been doing.  We will give you a copy of the GERD diet.  You can add pepcid 20 mg at bedtime ( Generic is famotidine)  Sleep with HOB elevated.  Continue the Flonase as you have been doing. Continue chlor tabs as you have been doing. Sips of water instead of throat clearing Sugar Free [employer] or [employer] originals for throat soothing. Delsym Cough syrup 5 cc's every 12 hours Non-sedating antihistamine of your choice daily ( Zyrtec, Allegra, Xyzol, Claritin ( Generic ok)  Please contact office for sooner follow up if symptoms do not improve or worsen or seek emergency care   Follow up in 3 months to see how you are doing.  Call if you need to be seen sooner.  Please contact office for sooner follow up if symptoms do not improve or worsen or seek emergency care

## 2021-05-01 NOTE — Progress Notes (Signed)
History of Present Illness Kerry Ortiz is a 55 y.o. female never smoker with upper airway cough syndrome. Last seen in the office 07/2019 by Dr. Melvyn Novas.    Synopsis Pt.  had Covid 19 diagnosis 02/19/2021. She has had cough and throat irritation since. She is here for follow up   05/01/2021 Pt. Presents for follow up of upper airway cough syndrome. At that time she was treated with a prednisone taper, asked to continue her Nexium and chlor tab therapy, add pepcid at bedtime and sleep with her HOB elevated , as I have concerns GERD may be contributing to her cough. We also added Delsym twice daily and throat soothing therapies. She returns today for follow up to see how her cough is doing.  She states she is doing much better. Her cough is much better after the prednisone taper. She does have an occasional tickle in the back of her throat, but this seld resolves with her cough suppression plan. .She is using her Delsym and throat soothing with sugar free hard candies. She denies any further issues or problems. She is thankful for the resolution of her cough.   Test Results: August 23/2022 Preoperative Diagnosis: Throat irritation Postoperative Diagnosis: same Procedure: Transnasal fiberoptic laryngoscopy Anesthesia: Topical with Afrin and Xylocaine Complications: None EBL: None  The fiberoptic laryngoscope was then placed through the nasal passage to view the pharynx and larynx. After completion, the telescope was removed. Findings included normal nasal passages, no mass or abnormality in the nasopharynx, and no mass or ulceration in the pharynx or larynx. Pyriform sinuses are open. Secretions are minimal. Vocal folds are without mass, scarring, or ulceration. The vocal folds adduct and abduct symmetrically. There is good glottal closure. Muscle tension patterns are not present. Laryngeal edema is minimal.- Fiberoptic exam of the pharynx and larynx is unremarkable   CBC Latest Ref Rng & Units  03/22/2020 03/22/2020 03/16/2018  WBC 4.0 - 10.5 K/uL - 4.3 7.2  Hemoglobin 12.0 - 15.0 g/dL 15.0 13.5 12.6  Hematocrit 36.0 - 46.0 % 44.0 44.3 41.3  Platelets 150 - 400 K/uL - 307 361    BMP Latest Ref Rng & Units 02/14/2021 03/22/2020 03/22/2020  Glucose 70 - 99 mg/dL 88 82 88  BUN 6 - 20 mg/dL 11 15 12   Creatinine 0.44 - 1.00 mg/dL 0.97 0.90 0.95  Sodium 135 - 145 mmol/L 134(L) 142 142  Potassium 3.5 - 5.1 mmol/L 3.9 4.3 3.6  Chloride 98 - 111 mmol/L 101 108 108  CO2 22 - 32 mmol/L 24 - 23  Calcium 8.9 - 10.3 mg/dL 9.2 - 9.4    BNP No results found for: BNP  ProBNP No results found for: PROBNP  PFT    Component Value Date/Time   FEV1PRE 1.96 12/15/2017 1355   FEV1POST 2.07 12/15/2017 1355   FVCPRE 2.46 12/15/2017 1355   FVCPOST 2.51 12/15/2017 1355   PREFEV1FVCRT 80 12/15/2017 1355   PSTFEV1FVCRT 82 12/15/2017 1355    No results found.   Past medical hx Past Medical History:  Diagnosis Date   Acid reflux    Asthma    Disturbance of skin sensation    Fibromyalgia    Migraine    Uterine fibroid      Social History   Tobacco Use   Smoking status: Never   Smokeless tobacco: Never  Vaping Use   Vaping Use: Never used  Substance Use Topics   Alcohol use: No   Drug use: No  KerryOrtiz reports that she has never smoked. She has never used smokeless tobacco. She reports that she does not drink alcohol and does not use drugs.  Tobacco Cessation: Never smoker   Past surgical hx, Family hx, Social hx all reviewed.  Current Outpatient Medications on File Prior to Visit  Medication Sig   albuterol (PROVENTIL HFA;VENTOLIN HFA) 108 (90 BASE) MCG/ACT inhaler Inhale 2 puffs into the lungs every 6 (six) hours as needed for wheezing or shortness of breath.   amitriptyline (ELAVIL) 25 MG tablet Take 1 tablet (25 mg total) by mouth at bedtime.   esomeprazole (NEXIUM) 40 MG capsule TAKE ONE CAPSULE BY MOUTH TWICE DAILY, 30 TO 60 MINUTES BEFORE FIRST AND LAST MEAL OF THE  DAY (Patient taking differently: Take 40 mg by mouth 2 (two) times daily before a meal.)   fluticasone (FLONASE) 50 MCG/ACT nasal spray Place 2 sprays into both nostrils daily.   hydroxychloroquine (PLAQUENIL) 200 MG tablet Take 400 mg by mouth daily.    ibuprofen (ADVIL) 600 MG tablet Take 1 tablet (600 mg total) by mouth every 6 (six) hours as needed.   meclizine (ANTIVERT) 25 MG tablet Take 1 tablet (25 mg total) by mouth 3 (three) times daily as needed for dizziness.   montelukast (SINGULAIR) 10 MG tablet Take 10 mg by mouth at bedtime.    ondansetron (ZOFRAN ODT) 4 MG disintegrating tablet Take 1 tablet (4 mg total) by mouth every 8 (eight) hours as needed.   pregabalin (LYRICA) 200 MG capsule Take 200 mg by mouth 2 (two) times daily.   rizatriptan (MAXALT-MLT) 10 MG disintegrating tablet Take 1 tab at onset of migraine.  May repeat in 2 hrs, if needed.  Max dose: 2 tabs/day. This is a 30 day prescription.   sucralfate (CARAFATE) 1 G tablet Take 1 tablet (1 g total) by mouth 4 (four) times daily. (Patient taking differently: Take 1 g by mouth daily.)   SUMAtriptan 6 MG/0.5ML SOAJ Take 1 injection at onset of migraine.  May repeat in 2 hrs, if needed.  Max dose: 2 injections/day. This is a 30 day prescription.   tiZANidine (ZANAFLEX) 4 MG tablet Take 4 mg by mouth every 6 (six) hours as needed for muscle spasms.   zonisamide (ZONEGRAN) 100 MG capsule Take 1 capsule (100 mg total) by mouth 2 (two) times daily. Pls call [phone] to schedule appt to continue refills or request from PCP.   No current facility-administered medications on file prior to visit.     Allergies  Allergen Reactions   Amoxicillin     Yeast infections    Review Of Systems:  Constitutional:   No  weight loss, night sweats,  Fevers, chills, fatigue, or  lassitude.  HEENT:   No headaches,  Difficulty swallowing,  Tooth/dental problems, or  Sore throat,                No sneezing, itching, ear ache, nasal congestion,  post nasal drip,   CV:  No chest pain,  Orthopnea, PND, swelling in lower extremities, anasarca, dizziness, palpitations, syncope.   GI  No heartburn, indigestion, abdominal pain, nausea, vomiting, diarrhea, change in bowel habits, loss of appetite, bloody stools.   Resp: No shortness of breath with exertion or at rest.  No excess mucus, no productive cough,  No non-productive cough,  No coughing up of blood.  No change in color of mucus.  No wheezing.  No chest wall deformity  Skin: no rash or lesions.  GU:  no dysuria, change in color of urine, no urgency or frequency.  No flank pain, no hematuria   MS:  No joint pain or swelling.  No decreased range of motion.  No back pain.  Psych:  No change in mood or affect. No depression or anxiety.  No memory loss.   Vital Signs BP 118/72 (BP Location: Left Arm, Cuff Size: Normal)   Pulse 73   Temp (!) 97.3 F (36.3 C) (Oral)   Ht 5\' 2"  (1.575 m)   Wt 133 lb 6.4 oz (60.5 kg)   LMP  (LMP Unknown) Comment: pt states before 2020  SpO2 100%   BMI 24.40 kg/m    Physical Exam:  General- No distress,  A&Ox3, Pleasant  ENT: No sinus tenderness, TM clear, pale nasal mucosa, no oral exudate,no post nasal drip, no LAN Cardiac: S1, S2, regular rate and rhythm, no murmur Chest: No wheeze/ rales/ dullness; no accessory muscle use, no nasal flaring, no sternal retractions Abd.: Soft Non-tender, ND, BS +, Body mass index is 24.4 kg/m.  Ext: No clubbing cyanosis, edema Neuro:  normal strength, MAE x 4, A&O x 3 Skin: No rashes, warm and dry, no lesions Psych: normal mood and behavior   Assessment/Plan  Upper Airway Cough Post Covid 19 Diagnosed 02/19/2021 Plan I'm glad your cough is better.  Continue Nexium as you have been doing.  We will give you a copy of the GERD diet.  You can add pepcid 20 mg at bedtime ( Generic is famotidine)  Sleep with HOB elevated.  Continue the Flonase as you have been doing. Continue chlor tabs as you have  been doing. Sips of water instead of throat clearing Sugar Free [employer] or [employer] originals for throat soothing. Delsym Cough syrup 5 cc's every 12 hours Non-sedating antihistamine of your choice daily ( Zyrtec, Allegra, Xyzol, Claritin ( Generic ok)  Please contact office for sooner follow up if symptoms do not improve or worsen or seek emergency care   Follow up in 3 months to see how you are doing.  Call if you need to be seen sooner.  Please contact office for sooner follow up if symptoms do not improve or worsen or seek emergency care     I spent 30 minutes dedicated to the care of this patient on the date of this encounter to include pre-visit review of records, face-to-face time with the patient discussing conditions above, post visit ordering of testing, clinical documentation with the electronic health record, making appropriate referrals as documented, and communicating necessary information to the patient's healthcare team.   Magdalen Spatz, NP 05/01/2021  3:01 PM

## 2021-05-14 DIAGNOSIS — Z23 Encounter for immunization: Secondary | ICD-10-CM | POA: Diagnosis not present

## 2021-07-01 DIAGNOSIS — J029 Acute pharyngitis, unspecified: Secondary | ICD-10-CM | POA: Diagnosis not present

## 2021-07-09 DIAGNOSIS — J45909 Unspecified asthma, uncomplicated: Secondary | ICD-10-CM | POA: Diagnosis not present

## 2021-07-23 NOTE — Progress Notes (Deleted)
PATIENT: Kerry Ortiz DOB: October 07, 1965  No chief complaint on file.  HISTORICAL  Kerry Ortiz is a 55 year old female, seen in refer by her primary care doctor Merrilee Seashore for evaluation of chronic migraine, initial evaluation was on April 23 2017.  I reviewed and summarized the referring note, she had past medical history of acid reflux, fibromyalgia, prediabetes, hyperlipidemia, chronic migraine headaches,  She reported history of headaches since age 16, her typical migraine starting from occipital region spreading forward pounding severe headache, associated light noise sensitivity, lasting for a few hours, she has tried over-the-counter Tylenol ibuprofen with limited help, she is now getting more frequent headaches since July 2018 about 2-3 times each week,  She has been evaluated by headache [place] in the past, still taking nortriptyline 25 mg 2 tablets every night, zonisamide 25 mg 3 tablets night as preventative medications,  I saw her previously for diffuse body achy pain, paresthesias throughout her body in 2017,   she had a history of fibromyalgia, carpal tunnel release surgery in the past, chronic migraines, she went on disability since 2008, complains of diffuse body achy pain, on polypharmacy treatment, including Elavil, Lyrica, Zonegran, She continue complains of depression symptoms, tearful during today's interview   Since 2012, she began to notice bilateral feet cold burning sensation, involving bottom and top of her feet, gradually trending up to knee level, getting worse with pressure, barium weight, she has chronic low back and neck pain, denies radiating pain, gait difficulty due to pain, no bowel and bladder incontinence, no bilateral upper extremity paresthesia or weakness,   She complains of feeling tired all the time, lack of interest,  laboratory in July 2016, A1c was elevated 5.9, normal B12 567, TSH 1.4, CMP, she had more laboratory  evaluation in August 2016  She is on polypharmacy treatment, taking Lyrica 150 mg twice a day, Celexa 10 mg daily, she still has intermittent bilateral feet paresthesia, no significant pain  EMG nerve conduction study in December 2016, there is no evidence of large fiber peripheral neuropathy   laboratory evaluation in November 2016:normal CBC, mildly low potassium on BMP 3.4  UPDATE Aug 20 2017: She was admitted to the hospital Oct 4 to 01/2017 with acute onset of dizziness, left lower extremity weakness, had extensive evaluations,  MRI of the brain in October 2018, that was normal.  Laboratory evaluation in October 2018, showed mild anemia hemoglobin of 11.3, CMP showed creatinine 0.85, normal TSH, A1c was 5.8, normal lipid profile, LDL was 90, negative ANA, CPK, ESR, HIV, troponin,  Echocardiogram was normal, Ultrasound of carotid artery showed less than 39% stenosis bilaterally.  Since that visit, she has intermittent dizziness, eye are twitching, ears are hurting.  UPDATE February 08 2019: She continue complains of occasionally migraine headaches, well controlled by Maxalt, she is still taking preventive medication Zonegran 100 mg twice a day, Elavil 25 mg at bedtime  She also complains of diffuse body achy pain, the care of rheumatologist, Plaquenil 400 mg daily,  She also complains 4 months history of worsening right-sided low back pain, radiating pain to right lower extremity, sometimes difficulty get out of bed  UPDATE Jun 26 2020: She has headache last for days in Sept 2021, on left side, tightness, ball up into fetal position, [facility], was told it is tension headaches, was given Toradol shot, last for 2 days that helped.  She has tried Maxalt help some.   She start to keep diary  and food, her headaches has improves since.   She has not had headache for one month.  She was having headaches once very 3-4 weeks ,   I personally reviewed CT head wo in August 2021  that was normal. Laboratory evaluation in Sept 2021 showed normal CMP, CBC Hg 13.5, INR 1.0  Update January 16, 2021 SS: Here today alone, reports for 2 weeks, changes to right-sided vision, the right peripheral half of the vision, is dark, like string of hair or smoke. No pain. No double vision, it moves around. Noted to swat around her eye, like bug flying around.  Headaches remain well controlled, on Zonegran, amitriptyline.  On average 1 headache a month.  Takes Maxalt with good benefit.  Has yet to use Imitrex injection, but does need needles and syringes.  On Plaquenil for RA.  Update July 24, 2021 SS:   REVIEW OF SYSTEMS: Full 14 system review of systems performed and notable only for as above  See HPI  ALLERGIES: Allergies  Allergen Reactions   Amoxicillin     Yeast infections    HOME MEDICATIONS: Current Outpatient Medications  Medication Sig Dispense Refill   albuterol (PROVENTIL HFA;VENTOLIN HFA) 108 (90 BASE) MCG/ACT inhaler Inhale 2 puffs into the lungs every 6 (six) hours as needed for wheezing or shortness of breath. 1 Inhaler 2   amitriptyline (ELAVIL) 25 MG tablet Take 1 tablet (25 mg total) by mouth at bedtime. 90 tablet 4   esomeprazole (NEXIUM) 40 MG capsule TAKE ONE CAPSULE BY MOUTH TWICE DAILY, 30 TO 60 MINUTES BEFORE FIRST AND LAST MEAL OF THE DAY (Patient taking differently: Take 40 mg by mouth 2 (two) times daily before a meal.) 180 capsule 0   fluticasone (FLONASE) 50 MCG/ACT nasal spray Place 2 sprays into both nostrils daily. 16 g 0   hydroxychloroquine (PLAQUENIL) 200 MG tablet Take 400 mg by mouth daily.      ibuprofen (ADVIL) 600 MG tablet Take 1 tablet (600 mg total) by mouth every 6 (six) hours as needed. 30 tablet 0   meclizine (ANTIVERT) 25 MG tablet Take 1 tablet (25 mg total) by mouth 3 (three) times daily as needed for dizziness. 12 tablet 0   montelukast (SINGULAIR) 10 MG tablet Take 10 mg by mouth at bedtime.      ondansetron (ZOFRAN ODT) 4 MG  disintegrating tablet Take 1 tablet (4 mg total) by mouth every 8 (eight) hours as needed. 20 tablet 6   pregabalin (LYRICA) 200 MG capsule Take 200 mg by mouth 2 (two) times daily.     rizatriptan (MAXALT-MLT) 10 MG disintegrating tablet Take 1 tab at onset of migraine.  May repeat in 2 hrs, if needed.  Max dose: 2 tabs/day. This is a 30 day prescription. 10 tablet 6   sucralfate (CARAFATE) 1 G tablet Take 1 tablet (1 g total) by mouth 4 (four) times daily. (Patient taking differently: Take 1 g by mouth daily.) 20 tablet 0   SUMAtriptan 6 MG/0.5ML SOAJ Take 1 injection at onset of migraine.  May repeat in 2 hrs, if needed.  Max dose: 2 injections/day. This is a 30 day prescription. 5 mL 6   tiZANidine (ZANAFLEX) 4 MG tablet Take 4 mg by mouth every 6 (six) hours as needed for muscle spasms.     zonisamide (ZONEGRAN) 100 MG capsule Take 1 capsule (100 mg total) by mouth 2 (two) times daily. Pls call [phone] to schedule appt to continue refills or request from PCP. Mallory  capsule 4   No current facility-administered medications for this visit.    PAST MEDICAL HISTORY: Past Medical History:  Diagnosis Date   Acid reflux    Asthma    Disturbance of skin sensation    Fibromyalgia    Migraine    Uterine fibroid     PAST SURGICAL HISTORY: Past Surgical History:  Procedure Laterality Date   CARPAL TUNNEL RELEASE     CYSTECTOMY     HAND SURGERY Right    SHOULDER SURGERY Right     FAMILY HISTORY: Family History  Problem Relation Age of Onset   Hypertension Mother    Arrhythmia Mother    Thyroid disease Mother    Pneumonia Father    Diabetes Maternal Grandmother    Kidney disease Sister    Multiple sclerosis Cousin     SOCIAL HISTORY:  Social History   Socioeconomic History   Marital status: Single    Spouse name: Not on file   Number of children: 1   Years of education: HS   Highest education level: Not on file  Occupational History   Occupation: Disabled  Tobacco Use    Smoking status: Never   Smokeless tobacco: Never  Vaping Use   Vaping Use: Never used  Substance and Sexual Activity   Alcohol use: No   Drug use: No   Sexual activity: Not Currently    Birth control/protection: None  Other Topics Concern   Not on file  Social History Narrative   Lives at home alone.   Right-handed.   No caffeine use.   Social Determinants of Health   Financial Resource Strain: Not on file  Food Insecurity: Not on file  Transportation Needs: Not on file  Physical Activity: Not on file  Stress: Not on file  Social Connections: Not on file  Intimate Partner Violence: Not on file   PHYSICAL EXAM   There were no vitals filed for this visit.  Not recorded     There is no height or weight on file to calculate BMI.  PHYSICAL EXAMNIATION:  Physical Exam  General: The patient is alert and cooperative at the time of the examination. Makes frequent swat motions around right eye.  Skin: No significant peripheral edema is noted.  Neurologic Exam  Mental status: The patient is alert and oriented x 3 at the time of the examination. The patient has apparent normal recent and remote memory, with an apparently normal attention span and concentration ability.  Cranial nerves: Facial symmetry is present. Speech is normal, no aphasia or dysarthria is noted. Extraocular movements are full. Visual fields are full.  Motor: The patient has good strength in all 4 extremities.  Sensory examination: Soft touch sensation is symmetric on the face, arms, and legs.  Coordination: The patient has good finger-nose-finger and heel-to-shin bilaterally.  Gait and station: The patient has a normal gait. Tandem gait is normal.   Reflexes: Deep tendon reflexes are symmetric.  CRANIAL NERVES:  DIAGNOSTIC DATA (LABS, IMAGING, TESTING) - I reviewed patient records, labs, notes, testing and imaging myself where available.  ASSESSMENT AND PLAN  Kerry Ortiz is a 55 y.o. female    Chronic migraine headache -Well-controlled, 1 every month -Continue Zonegran 100 mg twice a day, amitriptyline 25 mg at bedtime for migraine prevention -Continue Maxalt as needed for acute headache, may use Imitrex subcutaneous injection for severe prolonged headache, may combine with Zofran, NSAIDs, tizanidine  2. Right vision change -Urgent referral to ophthalmology, right peripheral vision  dark x 2 weeks  I spent 32 minutes of face-to-face and non-face-to-face time with patient.  This included previsit chart review, lab review, study review, order entry, discussing medications, vision symptoms, follow-up, urgent ophthalmology referral.  Evangeline Dakin, DNP  [facility] [address] [place], [place] [address] [phone]

## 2021-07-24 ENCOUNTER — Encounter: Payer: Self-pay | Admitting: Neurology

## 2021-07-24 ENCOUNTER — Ambulatory Visit: Payer: Medicare Other | Admitting: Neurology

## 2021-08-06 ENCOUNTER — Encounter: Payer: Self-pay | Admitting: Primary Care

## 2021-08-06 ENCOUNTER — Other Ambulatory Visit: Payer: Self-pay

## 2021-08-06 ENCOUNTER — Ambulatory Visit (INDEPENDENT_AMBULATORY_CARE_PROVIDER_SITE_OTHER): Payer: Medicare Other | Admitting: Primary Care

## 2021-08-06 VITALS — BP 108/64 | HR 77 | Temp 98.3°F | Ht 62.0 in | Wt 133.0 lb

## 2021-08-06 DIAGNOSIS — R053 Chronic cough: Secondary | ICD-10-CM | POA: Diagnosis not present

## 2021-08-06 DIAGNOSIS — R058 Other specified cough: Secondary | ICD-10-CM | POA: Diagnosis not present

## 2021-08-06 MED ORDER — LEVOCETIRIZINE DIHYDROCHLORIDE 5 MG PO TABS: 5.0000 mg | ORAL_TABLET | Freq: Every evening | ORAL | 3 refills | Status: DC

## 2021-08-06 MED ORDER — BENZONATATE 100 MG PO CAPS: 200.0000 mg | ORAL_CAPSULE | Freq: Three times a day (TID) | ORAL | 1 refills | Status: DC | PRN

## 2021-08-06 NOTE — Patient Instructions (Addendum)
Recommendations: - Stop Zyrtec; Start Xyzal 5MG  DAILY (RX sent) - Continue Singulair 10mg  at bedtime  - Continue Flonase daily NASAL spray daily  - ADD saline nasal rinses 1-2 times a day as needed for nasal congestion  - You can take chlor-tab 4mg  every 4 hours as needed for cough/allergies  - Take tessalon perles every 6 hours as needed for cough   Refer:  ENT re: chronic cough/ throat irritation (ordered)  Orders: - HRCT re: chronic cough (ordered)  Rx: - Tessalon perles  - Xyzal   Follow-up: First available with Dr. Melvyn Novas (please bring all medication in with you)

## 2021-08-06 NOTE — Assessment & Plan Note (Signed)
-   Patient has had a chronic cough since 2016.  Resp allergy panel was only mildly positive for cockroach, Eosinophile were normal. Methacholine challenge in 2019 was negative. CXR in 2020 showed clear lungs. She does have hx RA on plaquenil. D.t length of her cough recommend getting HRCT imaging to ensure she has not underlying ILD. She is consistently taking PPI twice daily, oral antihistamine, leukotriene inhibitor and nasal steroid sprays. She has seen no improvement in cough with prednisone course. Recommend she change antihistamine from Zyrtec to Xyzal. Continue Singulair and flonase. Add saline nasal rinses 1-2 times a day as needed for nasal congestion. She can take chlor-tab 4mg  every 4 hours as needed for cough/allergies and tessalon perles every 6 hours as needed for cough. Referring to ENT d/t chronic cough and throat irritation. Consider either therapeutic trial ICS inhaler or gabapentin for cough neuropathy. Follow-up first available with Dr. Melvyn Novas (please bring all medication in with you).

## 2021-08-06 NOTE — Progress Notes (Signed)
@Patient  ID: Kerry Ortiz, female    DOB: 04/13/1966, 56 y.o.   MRN: [id number]  Chief Complaint  Patient presents with   Follow-up    Patient says she still has a nagging cough. Says her cough makes her throw up and gag.     Referring provider: Merrilee Seashore, MD  HPI: 56 year old female, never smoked. PMH significant for upper airway cough syndrome. Patient of Dr. Melvyn Novas, last seen by NP on 05/01/21.   Previous [facility] pulmonary encounter: 9/28/22Elie Confer, NP RUDINE RIEGER is a 56 y.o. female never smoker with upper airway cough syndrome. Last seen in the office 07/2019 by Dr. Melvyn Novas.    Synopsis Pt.  had Covid 19 diagnosis 02/19/2021. She has had cough and throat irritation since. She is here for follow up     05/01/2021 Pt. Presents for follow up of upper airway cough syndrome. At that time she was treated with a prednisone taper, asked to continue her Nexium and chlor tab therapy, add pepcid at bedtime and sleep with her HOB elevated , as I have concerns GERD may be contributing to her cough. We also added Delsym twice daily and throat soothing therapies. She returns today for follow up to see how her cough is doing.  She states she is doing much better. Her cough is much better after the prednisone taper. She does have an occasional tickle in the back of her throat, but this seld resolves with her cough suppression plan. .She is using her Delsym and throat soothing with sugar free hard candies. She denies any further issues or problems. She is thankful for the resolution of her cough.    08/06/2021 Patient presents today for persistent cough. She has had a chronic, mostly dry, cough since 2016. Coughing fits last for 5-10 mins. Cough causes her to vomit. She is consistently taking nexium twice a day along with Zyrtec and Singulair. She also takes chor tabs twice a day. She has been using albuterol rescue inhaler twice a day on average. Prednisone has not particularly helped in the past.  She is on Plaquenil for rheumatoid arthritis. No active joint pain presently. She had normal chest xray back in 2020 but has never had CT of her chest. Feels she has something in her throat but denies trouble swallowing or choking. Resp allergy panel was only mildly positive for cockroach. Eosinophils were 0. Methacholine challenge in 2019 was negative for hyperactive airway   Allergies  Allergen Reactions   Amoxicillin     Yeast infections    Immunization History  Administered Date(s) Administered   Influenza Inj Mdck Quad Pf 09/06/2018   Influenza, Quadrivalent, Recombinant, Inj, Pf 10/07/2017, 08/18/2018, 08/11/2019, 07/12/2020, 05/14/2021   PFIZER(Purple Top)SARS-COV-2 Vaccination 11/22/2019, 06/18/2020   Tdap 06/17/2018    Past Medical History:  Diagnosis Date   Acid reflux    Asthma    Disturbance of skin sensation    Fibromyalgia    Migraine    Uterine fibroid     Tobacco History: Social History   Tobacco Use  Smoking Status Never  Smokeless Tobacco Never   Counseling given: Not Answered   Outpatient Medications Prior to Visit  Medication Sig Dispense Refill   albuterol (PROVENTIL HFA;VENTOLIN HFA) 108 (90 BASE) MCG/ACT inhaler Inhale 2 puffs into the lungs every 6 (six) hours as needed for wheezing or shortness of breath. 1 Inhaler 2   amitriptyline (ELAVIL) 25 MG tablet Take 1 tablet (25 mg total) by mouth at bedtime.  90 tablet 4   esomeprazole (NEXIUM) 40 MG capsule TAKE ONE CAPSULE BY MOUTH TWICE DAILY, 30 TO 60 MINUTES BEFORE FIRST AND LAST MEAL OF THE DAY (Patient taking differently: Take 40 mg by mouth 2 (two) times daily before a meal.) 180 capsule 0   fluticasone (FLONASE) 50 MCG/ACT nasal spray Place 2 sprays into both nostrils daily. 16 g 0   hydroxychloroquine (PLAQUENIL) 200 MG tablet Take 400 mg by mouth daily.      ibuprofen (ADVIL) 600 MG tablet Take 1 tablet (600 mg total) by mouth every 6 (six) hours as needed. 30 tablet 0   meclizine (ANTIVERT) 25  MG tablet Take 1 tablet (25 mg total) by mouth 3 (three) times daily as needed for dizziness. 12 tablet 0   montelukast (SINGULAIR) 10 MG tablet Take 10 mg by mouth at bedtime.      ondansetron (ZOFRAN ODT) 4 MG disintegrating tablet Take 1 tablet (4 mg total) by mouth every 8 (eight) hours as needed. 20 tablet 6   pregabalin (LYRICA) 200 MG capsule Take 200 mg by mouth 2 (two) times daily.     rizatriptan (MAXALT-MLT) 10 MG disintegrating tablet Take 1 tab at onset of migraine.  May repeat in 2 hrs, if needed.  Max dose: 2 tabs/day. This is a 30 day prescription. 10 tablet 6   sucralfate (CARAFATE) 1 G tablet Take 1 tablet (1 g total) by mouth 4 (four) times daily. (Patient taking differently: Take 1 g by mouth daily.) 20 tablet 0   SUMAtriptan 6 MG/0.5ML SOAJ Take 1 injection at onset of migraine.  May repeat in 2 hrs, if needed.  Max dose: 2 injections/day. This is a 30 day prescription. 5 mL 6   tiZANidine (ZANAFLEX) 4 MG tablet Take 4 mg by mouth every 6 (six) hours as needed for muscle spasms.     zonisamide (ZONEGRAN) 100 MG capsule Take 1 capsule (100 mg total) by mouth 2 (two) times daily. Pls call [phone] to schedule appt to continue refills or request from PCP. 180 capsule 4   No facility-administered medications prior to visit.   Review of Systems  Review of Systems  Constitutional: Negative.   HENT:  Positive for postnasal drip.   Respiratory:  Positive for cough.     Physical Exam  BP 108/64 (BP Location: Left Arm, Patient Position: Sitting, Cuff Size: Normal)    Pulse 77    Temp 98.3 F (36.8 C) (Oral)    Ht 5\' 2"  (1.575 m)    Wt 133 lb (60.3 kg)    LMP  (LMP Unknown) Comment: pt states before 2020   SpO2 98%    BMI 24.33 kg/m  Physical Exam Constitutional:      Appearance: Normal appearance.  HENT:     Head: Normocephalic and atraumatic.     Mouth/Throat:     Mouth: Mucous membranes are moist.     Pharynx: Oropharynx is clear.  Cardiovascular:     Rate and Rhythm:  Normal rate and regular rhythm.  Pulmonary:     Breath sounds: No wheezing or rhonchi.     Comments: CTA Musculoskeletal:        General: Normal range of motion.  Skin:    General: Skin is warm and dry.  Neurological:     General: No focal deficit present.     Mental Status: She is alert and oriented to person, place, and time. Mental status is at baseline.  Psychiatric:  Mood and Affect: Mood normal.        Behavior: Behavior normal.        Thought Content: Thought content normal.        Judgment: Judgment normal.     Lab Results:  CBC    Component Value Date/Time   WBC 4.3 03/22/2020 1700   RBC 5.59 (H) 03/22/2020 1700   HGB 15.0 03/22/2020 1746   HCT 44.0 03/22/2020 1746   PLT 307 03/22/2020 1700   MCV 79.2 (L) 03/22/2020 1700   MCH 24.2 (L) 03/22/2020 1700   MCHC 30.5 03/22/2020 1700   RDW 15.1 03/22/2020 1700   LYMPHSABS 1.8 03/22/2020 1700   MONOABS 0.2 03/22/2020 1700   EOSABS 0.0 03/22/2020 1700   BASOSABS 0.0 03/22/2020 1700    BMET    Component Value Date/Time   NA 134 (L) 02/14/2021 1520   K 3.9 02/14/2021 1520   CL 101 02/14/2021 1520   CO2 24 02/14/2021 1520   GLUCOSE 88 02/14/2021 1520   BUN 11 02/14/2021 1520   CREATININE 0.97 02/14/2021 1520   CALCIUM 9.2 02/14/2021 1520   GFRNONAA >60 02/14/2021 1520   GFRAA >60 03/22/2020 1700    BNP No results found for: BNP  ProBNP No results found for: PROBNP  Imaging: No results found.   Assessment & Plan:   Upper airway cough syndrome - Patient has had a chronic cough since 2016.  Resp allergy panel was only mildly positive for cockroach, Eosinophile were normal. Methacholine challenge in 2019 was negative. CXR in 2020 showed clear lungs. She does have hx RA on plaquenil. D.t length of her cough recommend getting HRCT imaging to ensure she has not underlying ILD. She is consistently taking PPI twice daily, oral antihistamine, leukotriene inhibitor and nasal steroid sprays. She has seen no  improvement in cough with prednisone course. Recommend she change antihistamine from Zyrtec to Xyzal. Continue Singulair and flonase. Add saline nasal rinses 1-2 times a day as needed for nasal congestion. She can take chlor-tab 4mg  every 4 hours as needed for cough/allergies and tessalon perles every 6 hours as needed for cough. Referring to ENT d/t chronic cough and throat irritation. Consider either therapeutic trial ICS inhaler or gabapentin for cough neuropathy. Follow-up first available with Dr. Melvyn Novas (please bring all medication in with you).    Martyn Ehrich, NP 08/06/2021

## 2021-08-13 ENCOUNTER — Ambulatory Visit
Admission: RE | Admit: 2021-08-13 | Discharge: 2021-08-13 | Disposition: A | Discharge status: Home / Self Care | Payer: Medicaid Other | Source: Ambulatory Visit | Attending: Primary Care | Admitting: Primary Care

## 2021-08-13 DIAGNOSIS — R053 Chronic cough: Secondary | ICD-10-CM

## 2021-08-25 NOTE — Progress Notes (Signed)
Subjective:     Patient ID: Kerry Ortiz, female   DOB: 1965/11/07,     MRN: [id number]   Brief patient profile:  57 yobf never smoker ? Asthma as infant and in HS developed recurrent abd problems dx as ulcers / gerd then around 2005 dx as fibromyalgia/ migraines then around 2015 developed indolent onset daily/noct cough dx as asthma > not much better p started rx with inhalers never returned to ER until 09/23/17 > no better with pred/duler  200 so referred to pulmonary clinic 11/03/2017 by Dr   Gayla Doss    History of Present Illness  11/03/2017 1st [facility] office visit/ Kerry Ortiz   Chief Complaint  Patient presents with   Pulmonary Consult    Referred by Dr. Ashby Dawes for eval of Asthma.  She is c/o cough x 2 months. She is coughing up some yellow to clear sputum "feels like I'm choking".  She states the cough tends to get worse in the evenings and also when she gets hot.  She uses her albuterol inhaler 3 x daily on average.    Kouffman Reflux v Neurogenic Cough Differentiator Reflux Comments  Do you awaken from a sound sleep coughing violently?                            With trouble breathing? Yes nightly yes   Do you have choking episodes when you cannot  Get enough air, gasping for air ?              Yes   Do you usually cough when you lie down into  The bed, or when you just lie down to rest ?                          After a while = couple of min   Do you usually cough after meals or eating?         Crackers make it happen   Do you cough when (or after) you bend over?    Yes occ    GERD SCORE     Kouffman Reflux v Neurogenic Cough Differentiator Neurogenic   Do you more-or-less cough all day long? sporadic   Does change of temperature make you cough? Hot worse   Does laughing or chuckling cause you to cough? yes   Do fumes (perfume, automobile fumes, burned  Toast, etc.,) cause you to cough ?      Yes    Does speaking, singing, or talking on the phone cause you to cough    ?               Yes    Neurogenic/Airway score       Cough drops make it worse/ sob mostly with coughing / some gen ant chest discomfort during coughing fits  rec Change nexium to  Where you take it Take 30- 60 min before your first and last meals of the day  Take delsym two tsp every 12 hours and supplement if needed with  Tylenol #3  up to 1-2 every 4 hours to suppress the urge to cough. Swallowing water and/or using ice chips/non mint and menthol containing candies (such as lifesavers or sugarless jolly ranchers) are also effective.   Once you have eliminated the cough for 3 straight days try reducing the Tylenol #3  first,  then the delsym as tolerated.   Please schedule  a follow up office visit in 2 weeks, sooner if needed  with all medications /inhalers/ solutions in hand so we can verify exactly what you are taking. This includes all medications from all doctors and over the counters  next:  Consider 1st gen H1 blockers per guidelines / methacholine challenge while on max gerd rx      11/17/2017  f/u ov/Kerry Ortiz re: cough x 2015, did not follow recs/ no meds or candy handy Chief Complaint  Patient presents with   Follow-up    2 week f/u for cough, reports still has dry cough, increased SOB with humidity, orthopnea x2 pillows.   Dyspnea:  As long as not coughing >>  breathing ok  Cough: no particular time of day /noct but def with voice use or perfume/ assoc with sense of pnds "constant" with choking sensation  Sleep: better with Tylenol #3 but never used more than 2 in 24 h SABA use: not  Needed rec For drainage / throat tickle try take CHLORPHENIRAMINE  4 mg - take one every 4 hours as needed   If this isn't effective add tylenel #3 up to 2 every 4 hours until no cough x 3 days then try off  And start Prednisone 10 mg take  4 each am x 2 days,   2 each am x 2 days,  1 each am x 2 days and stop       12/04/2017  f/u ov/Kerry Ortiz re: cough x 2015 / did not use h1 as rec  - did bring all  meds as req  Chief Complaint  Patient presents with   Follow-up    Cough has improved some, but she is still having CP and SOB the same since the last visit. She has not had to use her albuterol inhaler but has been using an empty Dulera inhaler 2-3 x per day.   Dyspnea:  Not unless coughing and no worse off dulera Cough: some better overall but s change in pattern   Sleep: better  SABA use:  None  rec For drainage / throat tickle try take CHLORPHENIRAMINE  4 mg - take one every 4 hours as needed    schedule methacholine challenge test  Neg > ref to Dr Bettina Gavia at [facility] or  can return to ent of choice    07/26/2019  f/u ov/Kerry Ortiz re:  Re establish re cough x 2015 / did not bring meals / sense of globus/gag/vomiting Chief Complaint  Patient presents with   Follow-up    Upper airway cough syndrome  Dyspnea:  No reg exercise  Cough: dry/ worse p lie down swallowing water helps / jollys ranchers also help  Sleeping: does not typically wake her up  SABA use: once a week 02: none  Rec Keep the candy handy and use sugarless hard rock candy like jolly ranchers Ok to see allergy take all your medications and let them know you had methacholine negative For drainage / throat tickle stop the zyrtec  try take CHLORPHENIRAMINE  4 mg  We will call to schedule a barium swallow   08/12/19  1. Small hiatal hernia. 2. Mild esophageal dysmotility, likely presbyesophagus. 3. Spontaneous gastroesophageal reflux with water swallows Pulmonary follow up is as needed but if need to return for any reason please bring all your medications     08/26/2021  f/u ov/Kerry Ortiz re: uacs/gerd   maint on nexium did not bring meds  Chief Complaint  Patient presents with   Acute Visit  Cough has been progressively worse since the last visit. Cough is prod with clear to white sputum. She is using her albuterol inhaler about 2 x per day.   Dyspnea:  30 min couple time a week  Cough: worse with heat exp/ shortly after lies  down bed two pillows / coughing so hard she throws up but o/w min mucoid production  Sleeping: flat on back /two pillows  SABA use: ? Really helps  02: none  Covid status:   vax x 4/ also infected x one    No obvious day to day or daytime variability or assoc excess/ purulent sputum or mucus plugs or hemoptysis or cp or chest tightness, subjective wheeze or overt sinus or hb symptoms.     Also denies any obvious fluctuation of symptoms with weather or environmental changes or other aggravating or alleviating factors except as outlined above   No unusual exposure hx or h/o childhood pna/ asthma or knowledge of premature birth.  Current Allergies, Complete Past Medical History, Past Surgical History, Family History, and Social History were reviewed in [employer] record.  ROS  The following are not active complaints unless bolded Hoarseness, sore throat, dysphagia, dental problems, itching, sneezing,  nasal congestion or discharge of excess mucus or purulent secretions, ear ache,   fever, chills, sweats, unintended wt loss or wt gain, classically pleuritic or exertional cp,  orthopnea pnd or arm/hand swelling  or leg swelling, presyncope, palpitations, abdominal pain, anorexia, nausea, vomiting, diarrhea  or change in bowel habits or change in bladder habits, change in stools or change in urine, dysuria, hematuria,  rash, arthralgias, visual complaints, headache, numbness, weakness or ataxia or problems with walking or coordination,  change in mood or  memory.        Current Meds  Medication Sig   albuterol (PROVENTIL HFA;VENTOLIN HFA) 108 (90 BASE) MCG/ACT inhaler Inhale 2 puffs into the lungs every 6 (six) hours as needed for wheezing or shortness of breath.   amitriptyline (ELAVIL) 25 MG tablet Take 1 tablet (25 mg total) by mouth at bedtime.   benzonatate (TESSALON) 100 MG capsule Take 2 capsules (200 mg total) by mouth 3 (three) times daily as needed for cough.    esomeprazole (NEXIUM) 40 MG capsule TAKE ONE CAPSULE BY MOUTH TWICE DAILY, 30 TO 60 MINUTES BEFORE FIRST AND LAST MEAL OF THE DAY (Patient taking differently: Take 40 mg by mouth 2 (two) times daily before a meal.)   fluticasone (FLONASE) 50 MCG/ACT nasal spray Place 2 sprays into both nostrils daily.   guaiFENesin-dextromethorphan (ROBITUSSIN DM) 100-10 MG/5ML syrup Take 5 mLs by mouth every 4 (four) hours as needed for cough.   hydroxychloroquine (PLAQUENIL) 200 MG tablet Take 400 mg by mouth daily.    ibuprofen (ADVIL) 600 MG tablet Take 1 tablet (600 mg total) by mouth every 6 (six) hours as needed.   levocetirizine (XYZAL) 5 MG tablet Take 1 tablet (5 mg total) by mouth every evening.   meclizine (ANTIVERT) 25 MG tablet Take 1 tablet (25 mg total) by mouth 3 (three) times daily as needed for dizziness.   montelukast (SINGULAIR) 10 MG tablet Take 10 mg by mouth at bedtime.    ondansetron (ZOFRAN ODT) 4 MG disintegrating tablet Take 1 tablet (4 mg total) by mouth every 8 (eight) hours as needed.   pregabalin (LYRICA) 200 MG capsule Take 200 mg by mouth 2 (two) times daily.   rizatriptan (MAXALT-MLT) 10 MG disintegrating tablet Take 1 tab at  onset of migraine.  May repeat in 2 hrs, if needed.  Max dose: 2 tabs/day. This is a 30 day prescription.   sucralfate (CARAFATE) 1 G tablet Take 1 tablet (1 g total) by mouth 4 (four) times daily. (Patient taking differently: Take 1 g by mouth daily.)   SUMAtriptan 6 MG/0.5ML SOAJ Take 1 injection at onset of migraine.  May repeat in 2 hrs, if needed.  Max dose: 2 injections/day. This is a 30 day prescription.   tiZANidine (ZANAFLEX) 4 MG tablet Take 4 mg by mouth every 6 (six) hours as needed for muscle spasms.   zonisamide (ZONEGRAN) 100 MG capsule Take 1 capsule (100 mg total) by mouth 2 (two) times daily. Pls call [phone] to schedule appt to continue refills or request from PCP.                       Objective:   Physical Exam    08/26/2021        138 07/26/2019     135  12/04/2017         132   11/17/2017      130   11/03/17 129 lb 12.8 oz (58.9 kg)  08/20/17 131 lb (59.4 kg)  06/18/17 130 lb (59 kg)     Vital signs reviewed  08/26/2021  - Note at rest 02 sats  100% on RA   General appearance:    amb somber bf nad   HEENT : pt wearing mask not removed for exam due to covid -19 concerns.    NECK :  without JVD/Nodes/TM/ nl carotid upstrokes bilaterally   LUNGS: no acc muscle use,  Nl contour chest which is clear to A and P bilaterally without cough on insp or exp maneuvers   CV:  RRR  no s3 or murmur or increase in P2, and no edema   ABD:  soft and nontender with nl inspiratory excursion in the supine position. No bruits or organomegaly appreciated, bowel sounds nl  MS:  Nl gait/ ext warm without deformities, calf tenderness, cyanosis or clubbing No obvious joint restrictions   SKIN: warm and dry without lesions    NEURO:  alert, approp, nl sensorium with  no motor or cerebellar deficits apparent.             I personally reviewed images and agree with radiology impression as follows:   Chest HRCT  08/23/21 1. No evidence of fibrotic interstitial lung disease. 2. Mild air trapping, findings can be seen in the setting of small airways disease.    Assessment:

## 2021-08-26 ENCOUNTER — Other Ambulatory Visit: Payer: Self-pay

## 2021-08-26 ENCOUNTER — Encounter: Payer: Self-pay | Admitting: Internal Medicine

## 2021-08-26 ENCOUNTER — Ambulatory Visit (INDEPENDENT_AMBULATORY_CARE_PROVIDER_SITE_OTHER): Payer: Medicare Other | Admitting: Internal Medicine

## 2021-08-26 DIAGNOSIS — R058 Other specified cough: Secondary | ICD-10-CM | POA: Diagnosis not present

## 2021-08-26 NOTE — Assessment & Plan Note (Signed)
Onset around 2015 -  MRI sinus  05/07/17 Sinuses/Orbits:  . Paranasal sinuses clear. No mastoid effusion. Inner ear structures normal. - FENO 11/03/2017  =   5 on dulera 200 2bid with no improvement in cough > d/c'd 11/03/2017  -  Allergy profile 11/03/2017 >  Eos 0.0 /  IgE  83 RAST pos cockroach only  - cyclical cough regimen 0/04/9832 > not adherent - FENO 11/17/2017  =    7 on no rx   - 12/04/2017 did not try 1st gen H1 > try chlorpheniramine and MCT done 12/15/17 neg for asthma - ENT eval 02/25/18 > neg [facility]/ [facility] re-eval 07/14/19 rec allergy eval/cxr/consier NF  - 07/26/2019 returned to pulmonary clinic s meds as reqeuested > cxr nl  - DgEs  08/12/2019 >>> 1. Small hiatal hernia. 2. Mild esophageal dysmotility, likely presbyesophagus. 3. Spontaneous gastroesophageal reflux with water swallows > rec GI eval   Chest HRCT  08/23/21 1. No evidence of fibrotic interstitial lung disease. 2. Mild air trapping, findings can be seen in the setting of small airways disease. - 08/26/2021 rec max gerd rx/ 1st gen H1 blockers per guidelines  / bed elevation and f/u in 6  weeks ? Refer for NF  The standardized cough guidelines published in Chest by Lissa Morales in 2006 are still the best available and consist of a multiple step process (up to 12!) , not a single office visit,  and are intended  to address this problem logically,  with an alogrithm dependent on response to empiric treatment at  each progressive step  to determine a specific diagnosis with  minimal addtional testing needed. Therefore if adherence is an issue or can't be accurately verified,  it's very unlikely the standard evaluation and treatment will be successful here.    Furthermore, response to therapy (other than acute cough suppression, which should only be used short term with avoidance of narcotic containing cough syrups if possible), can be a gradual process for which the patient is not likely to  perceive immediate benefit.  Unlike going to  an eye doctor where the best perscription is almost always the first one and is immediately effective, this is almost never the case in the management of chronic cough syndromes. Therefore the patient needs to commit up front to consistently adhere to recommendations  for up to 6 weeks of therapy directed at the likely underlying problem(s) before the response can be reasonably evaluated.   If she returns with meds as requested multiple times and she can demonstrated compliance we would have exhausted medical options at this point and would refer for NF - alternatively this can be done by PCP or GI         Each maintenance medication was reviewed in detail including emphasizing most importantly the difference between maintenance and prns and under what circumstances the prns are to be triggered using an action plan format where appropriate.  Total time for H and P, chart review, counseling, reviewing [place] of hfa device(s) and generating customized AVS unique to this office visit / same day charting  > 30 min

## 2021-08-26 NOTE — Patient Instructions (Addendum)
Nexium 40 mg Take 30- 60 min before your first and last meals of the day    For drainage / throat tickle try take CHLORPHENIRAMINE  4 mg  ("Allergy Relief" 4mg   at [facility] should be easiest to find in the blue box usually on bottom shelf)  take one every 4 hours as needed - extremely effective and inexpensive over the counter- may cause drowsiness so start with just a dose or two an hour before bedtime and see how you tolerate it before trying in daytime.   GERD (REFLUX)  is an extremely common cause of respiratory symptoms just like yours , many times with no obvious heartburn at all.    It can be treated with medication, but also with lifestyle changes including elevation of the head of your bed (ideally with 6 -8inch blocks under the headboard of your bed - take a picture if you can't get this done),  Smoking cessation, avoidance of late meals, excessive alcohol, and avoid fatty foods, chocolate, peppermint, colas, red wine, and acidic juices such as orange juice.  NO MINT OR MENTHOL PRODUCTS SO NO COUGH DROPS  USE SUGARLESS CANDY INSTEAD (Jolley ranchers or Stover's or Life Savers) or even ice chips will also do - the key is to swallow to prevent all throat clearing. NO OIL BASED VITAMINS - use powdered substitutes.  Avoid fish oil when coughing.    Please schedule a follow up office visit in 6 weeks, call sooner if needed with all medications /inhalers/ solutions in hand so we can verify exactly what you are taking. This includes all medications from all doctors and over the counters   If you not better on maximum medical therapy I am going to refer you to Dr Kaylyn Lim for Nifan Fundoplication

## 2021-10-07 ENCOUNTER — Other Ambulatory Visit (HOSPITAL_COMMUNITY)
Admission: RE | Admit: 2021-10-07 | Discharge: 2021-10-07 | Disposition: A | Discharge status: Home / Self Care | Payer: Medicare Other | Source: Ambulatory Visit | Attending: Obstetrics and Gynecology | Admitting: Obstetrics and Gynecology

## 2021-10-07 ENCOUNTER — Ambulatory Visit: Payer: Medicaid Other | Admitting: Internal Medicine

## 2021-10-07 ENCOUNTER — Ambulatory Visit: Payer: Commercial Managed Care - HMO | Admitting: Internal Medicine

## 2021-10-07 ENCOUNTER — Other Ambulatory Visit: Payer: Self-pay | Admitting: Obstetrics and Gynecology

## 2021-10-07 DIAGNOSIS — Z1151 Encounter for screening for human papillomavirus (HPV): Secondary | ICD-10-CM | POA: Insufficient documentation

## 2021-10-07 DIAGNOSIS — Z01419 Encounter for gynecological examination (general) (routine) without abnormal findings: Secondary | ICD-10-CM | POA: Insufficient documentation

## 2021-10-07 NOTE — Progress Notes (Unsigned)
Subjective:     Patient ID: Kerry Ortiz, female   DOB: 1965/11/07,     MRN: [id number]   Brief patient profile:  57 yobf never smoker ? Asthma as infant and in HS developed recurrent abd problems dx as ulcers / gerd then around 2005 dx as fibromyalgia/ migraines then around 2015 developed indolent onset daily/noct cough dx as asthma > not much better p started rx with inhalers never returned to ER until 09/23/17 > no better with pred/duler  200 so referred to pulmonary clinic 11/03/2017 by Dr   Gayla Doss    History of Present Illness  11/03/2017 1st [facility] office visit/ Immanuel Fedak   Chief Complaint  Patient presents with   Pulmonary Consult    Referred by Dr. Ashby Dawes for eval of Asthma.  She is c/o cough x 2 months. She is coughing up some yellow to clear sputum "feels like I'm choking".  She states the cough tends to get worse in the evenings and also when she gets hot.  She uses her albuterol inhaler 3 x daily on average.    Kouffman Reflux v Neurogenic Cough Differentiator Reflux Comments  Do you awaken from a sound sleep coughing violently?                            With trouble breathing? Yes nightly yes   Do you have choking episodes when you cannot  Get enough air, gasping for air ?              Yes   Do you usually cough when you lie down into  The bed, or when you just lie down to rest ?                          After a while = couple of min   Do you usually cough after meals or eating?         Crackers make it happen   Do you cough when (or after) you bend over?    Yes occ    GERD SCORE     Kouffman Reflux v Neurogenic Cough Differentiator Neurogenic   Do you more-or-less cough all day long? sporadic   Does change of temperature make you cough? Hot worse   Does laughing or chuckling cause you to cough? yes   Do fumes (perfume, automobile fumes, burned  Toast, etc.,) cause you to cough ?      Yes    Does speaking, singing, or talking on the phone cause you to cough    ?               Yes    Neurogenic/Airway score       Cough drops make it worse/ sob mostly with coughing / some gen ant chest discomfort during coughing fits  rec Change nexium to  Where you take it Take 30- 60 min before your first and last meals of the day  Take delsym two tsp every 12 hours and supplement if needed with  Tylenol #3  up to 1-2 every 4 hours to suppress the urge to cough. Swallowing water and/or using ice chips/non mint and menthol containing candies (such as lifesavers or sugarless jolly ranchers) are also effective.   Once you have eliminated the cough for 3 straight days try reducing the Tylenol #3  first,  then the delsym as tolerated.   Please schedule  a follow up office visit in 2 weeks, sooner if needed  with all medications /inhalers/ solutions in hand so we can verify exactly what you are taking. This includes all medications from all doctors and over the counters  next:  Consider 1st gen H1 blockers per guidelines / methacholine challenge while on max gerd rx      11/17/2017  f/u ov/Kweli Grassel re: cough x 2015, did not follow recs/ no meds or candy handy Chief Complaint  Patient presents with   Follow-up    2 week f/u for cough, reports still has dry cough, increased SOB with humidity, orthopnea x2 pillows.   Dyspnea:  As long as not coughing >>  breathing ok  Cough: no particular time of day /noct but def with voice use or perfume/ assoc with sense of pnds "constant" with choking sensation  Sleep: better with Tylenol #3 but never used more than 2 in 24 h SABA use: not  Needed rec For drainage / throat tickle try take CHLORPHENIRAMINE  4 mg - take one every 4 hours as needed   If this isn't effective add tylenel #3 up to 2 every 4 hours until no cough x 3 days then try off  And start Prednisone 10 mg take  4 each am x 2 days,   2 each am x 2 days,  1 each am x 2 days and stop       12/04/2017  f/u ov/Evens Meno re: cough x 2015 / did not use h1 as rec  - did bring all  meds as req  Chief Complaint  Patient presents with   Follow-up    Cough has improved some, but she is still having CP and SOB the same since the last visit. She has not had to use her albuterol inhaler but has been using an empty Dulera inhaler 2-3 x per day.   Dyspnea:  Not unless coughing and no worse off dulera Cough: some better overall but s change in pattern   Sleep: better  SABA use:  None  rec For drainage / throat tickle try take CHLORPHENIRAMINE  4 mg - take one every 4 hours as needed    schedule methacholine challenge test  Neg > ref to Dr Bettina Gavia at [facility] or  can return to ent of choice    07/26/2019  f/u ov/Kearah Gayden re:  Re establish re cough x 2015 / did not bring meals / sense of globus/gag/vomiting Chief Complaint  Patient presents with   Follow-up    Upper airway cough syndrome  Dyspnea:  No reg exercise  Cough: dry/ worse p lie down swallowing water helps / jollys ranchers also help  Sleeping: does not typically wake her up  SABA use: once a week 02: none  Rec Keep the candy handy and use sugarless hard rock candy like jolly ranchers Ok to see allergy take all your medications and let them know you had methacholine negative For drainage / throat tickle stop the zyrtec  try take CHLORPHENIRAMINE  4 mg  We will call to schedule a barium swallow   08/12/19  1. Small hiatal hernia. 2. Mild esophageal dysmotility, likely presbyesophagus. 3. Spontaneous gastroesophageal reflux with water swallows Pulmonary follow up is as needed but if need to return for any reason please bring all your medications     08/26/2021  f/u ov/Kery Haltiwanger re: uacs/gerd   maint on nexium did not bring meds  Chief Complaint  Patient presents with   Acute Visit  Cough has been progressively worse since the last visit. Cough is prod with clear to white sputum. She is using her albuterol inhaler about 2 x per day.   Dyspnea:  30 min couple time a week  Cough: worse with heat exp/ shortly after lies  down bed two pillows / coughing so hard she throws up but o/w min mucoid production  Sleeping: flat on back /two pillows  SABA use: ? Really helps  02: none  Covid status:   vax x 4/ also infected x one   Rec Nexium 40 mg Take 30- 60 min before your first and last meals of the day  For drainage / throat tickle try take CHLORPHENIRAMINE  4 mg  ("Allergy Relief" '4mg'$   at [employer] \ GERD diet reviewed, bed blocks rec  Please schedule a follow up office visit in 6 weeks, call sooner if needed with all medications /inhalers/ solutions in hand  If you not better on maximum medical therapy I am going to refer you to Dr Kaylyn Lim for Nifan Fundoplication    10/10/8826  f/u ov/Kaliq Lege re: ***   maint on ***  No chief complaint on file.   Dyspnea:  *** Cough: *** Sleeping: *** SABA use: *** 02: *** Covid status:   ***   No obvious day to day or daytime variability or assoc excess/ purulent sputum or mucus plugs or hemoptysis or cp or chest tightness, subjective wheeze or overt sinus or hb symptoms.   *** without nocturnal  or early am exacerbation  of respiratory  c/o's or need for noct saba. Also denies any obvious fluctuation of symptoms with weather or environmental changes or other aggravating or alleviating factors except as outlined above   No unusual exposure hx or h/o childhood pna/   knowledge of premature birth.  Current Allergies, Complete Past Medical History, Past Surgical History, Family History, and Social History were reviewed in [employer] record.  ROS  The following are not active complaints unless bolded Hoarseness, sore throat, dysphagia, dental problems, itching, sneezing,  nasal congestion or discharge of excess mucus or purulent secretions, ear ache,   fever, chills, sweats, unintended wt loss or wt gain, classically pleuritic or exertional cp,  orthopnea pnd or arm/hand swelling  or leg swelling, presyncope, palpitations, abdominal pain,  anorexia, nausea, vomiting, diarrhea  or change in bowel habits or change in bladder habits, change in stools or change in urine, dysuria, hematuria,  rash, arthralgias, visual complaints, headache, numbness, weakness or ataxia or problems with walking or coordination,  change in mood or  memory.        No outpatient medications have been marked as taking for the 10/07/21 encounter (Appointment) with Tanda Rockers, MD.                     Objective:   Physical Exam Wts  10/07/2021            ***  08/26/2021       138 07/26/2019     135  12/04/2017         132   11/17/2017      130   11/03/17 129 lb 12.8 oz (58.9 kg)  08/20/17 131 lb (59.4 kg)  06/18/17 130 lb (59 kg)    Vital signs reviewed  10/07/2021  - Note at rest 02 sats  ***% on ***   General appearance:    ***  Assessment:

## 2021-10-10 LAB — CYTOLOGY - PAP
Comment: NEGATIVE
Diagnosis: NEGATIVE
Diagnosis: REACTIVE
High risk HPV: NEGATIVE

## 2021-10-29 ENCOUNTER — Ambulatory Visit (INDEPENDENT_AMBULATORY_CARE_PROVIDER_SITE_OTHER): Payer: Medicare Other | Admitting: Internal Medicine

## 2021-10-29 ENCOUNTER — Encounter: Payer: Self-pay | Admitting: Internal Medicine

## 2021-10-29 ENCOUNTER — Other Ambulatory Visit: Payer: Self-pay

## 2021-10-29 DIAGNOSIS — R058 Other specified cough: Secondary | ICD-10-CM

## 2021-10-29 NOTE — Progress Notes (Signed)
Subjective:  ?  ? Patient ID: Kerry Ortiz, female   DOB: 1966-03-11,     MRN: [id number] ? ?Brief patient profile:  ?36 yobf never smoker ? Asthma as infant and in HS developed recurrent abd problems dx as ulcers / gerd then around 2005 dx as fibromyalgia/ migraines then around 2015 developed indolent onset daily/noct cough dx as asthma > not much better p started rx with inhalers never returned to ER until 09/23/17 > no better with pred/duler  200 so referred to pulmonary clinic 11/03/2017 by Dr   Gayla Doss ? ? ? ?History of Present Illness  ?11/03/2017 1st [facility] office visit/ Kerry Ortiz   ?Chief Complaint  ?Patient presents with  ? Pulmonary Consult  ?  Referred by Dr. Ashby Dawes for eval of Asthma.  She is c/o cough x 2 months. She is coughing up some yellow to clear sputum "feels like I'm choking".  She states the cough tends to get worse in the evenings and also when she gets hot.  She uses her albuterol inhaler 3 x daily on average.   ? ?Kouffman Reflux v Neurogenic Cough Differentiator Reflux Comments  ?Do you awaken from a sound sleep coughing violently?                            ?With trouble breathing? Yes nightly ?yes   ?Do you have choking episodes when you cannot  Get enough air, gasping for air ?             ? Yes   ?Do you usually cough when you lie down into  The bed, or when you just lie down to rest ?                         ? After a while = couple of min   ?Do you usually cough after meals or eating?         Crackers make it happen   ?Do you cough when (or after) you bend over?    Yes occ    ?GERD SCORE     ?Kouffman Reflux v Neurogenic Cough Differentiator Neurogenic   ?Do you more-or-less cough all day long? sporadic   ?Does change of temperature make you cough? Hot worse   ?Does laughing or chuckling cause you to cough? yes   ?Do fumes (perfume, automobile fumes, burned  Toast, etc.,) cause you to cough ?      Yes    ?Does speaking, singing, or talking on the phone cause you to cough    ?              ? Yes    ?Neurogenic/Airway score    ?  ? ?Cough drops make it worse/ sob mostly with coughing / some gen ant chest discomfort during coughing fits  ?rec ?Change nexium to  Where you take it Take 30- 60 min before your first and last meals of the day  ?Take delsym two tsp every 12 hours and supplement if needed with  Tylenol #3  up to 1-2 every 4 hours to suppress the urge to cough. Swallowing water and/or using ice chips/non mint and menthol containing candies (such as lifesavers or sugarless jolly ranchers) are also effective.   ?Once you have eliminated the cough for 3 straight days try reducing the Tylenol #3  first,  then the delsym as tolerated.   ?Please schedule a  follow up office visit in 2 weeks, sooner if needed  with all medications /inhalers/ solutions in hand so we can verify exactly what you are taking. This includes all medications from all doctors and over the counters ? next:  Consider 1st gen H1 blockers per guidelines / methacholine challenge while on max gerd rx  ? ? ?  ? ? ?12/04/2017  f/u ov/Kerry Ortiz re: cough x 2015 / did not use h1 as rec  - did bring all meds as req  ?Chief Complaint  ?Patient presents with  ? Follow-up  ?  Cough has improved some, but she is still having CP and SOB the same since the last visit. She has not had to use her albuterol inhaler but has been using an empty Dulera inhaler 2-3 x per day.   ?Dyspnea:  Not unless coughing and no worse off dulera ?Cough: some better overall but s change in pattern   ?Sleep: better  ?SABA use:  None  ?rec ?For drainage / throat tickle try take CHLORPHENIRAMINE  4 mg - take one every 4 hours as needed    ?schedule methacholine challenge test  Neg > ref to [facility] at [facility] or  can return to ent of choice ? ? ? ?07/26/2019  f/u ov/Kerry Ortiz re:  Re establish re cough x 2015 / did not bring meals / sense of globus/gag/vomiting ?Chief Complaint  ?Patient presents with  ? Follow-up  ?  Upper airway cough syndrome  ?Dyspnea:  No reg  exercise  ?Cough: dry/ worse p lie down swallowing water helps / jollys ranchers also help  ?Sleeping: does not typically wake her up  ?SABA use: once a week ?02: none  ?Rec ?Keep the candy handy and use sugarless hard rock candy like jolly ranchers ?Ok to see allergy take all your medications and let them know you had methacholine negative ?For drainage / throat tickle stop the zyrtec  try take CHLORPHENIRAMINE  4 mg  ?We will call to schedule a barium swallow   ?08/12/19  ?1. Small hiatal hernia. ?2. Mild esophageal dysmotility, likely presbyesophagus. ?3. Spontaneous gastroesophageal reflux with water swallows ?Pulmonary follow up is as needed but if need to return for any reason please bring all your medications  ? ? ? ?08/26/2021  f/u ov/Kerry Ortiz re: uacs/gerd   maint on nexium did not bring meds  ?Chief Complaint  ?Patient presents with  ? Acute Visit  ?  Cough has been progressively worse since the last visit. Cough is prod with clear to white sputum. She is using her albuterol inhaler about 2 x per day.   ?Dyspnea:  30 min couple time a week  ?Cough: worse with heat exp/ shortly after lies down bed two pillows / coughing so hard she throws up but o/w min mucoid production  ?Sleeping: flat on back /two pillows  ?SABA use: ? Really helps  ?02: none  ?Covid status:   vax x 4/ also infected x one   ?Rec ?Nexium 40 mg Take 30- 60 min before your first and last meals of the day  ?For drainage / throat tickle try take CHLORPHENIRAMINE  4 mg  ("Allergy Relief" '4mg'$   at [employer]   ?GERD diet reviewed, bed blocks rec  ?Please schedule a follow up office visit in 6 weeks, call sooner if needed with all medications /inhalers/ solutions in hand  ?If you not better on maximum medical therapy I am going to refer you to Dr Kaylyn Lim for  Nifan Fundoplication  ? ? ?10/29/2021  f/u ov/Kerry Ortiz re: uacs/gerd   maint on gerd  nexium 40 mg bid  ?Chief Complaint  ?Patient presents with  ? Follow-up  ?  6 wk f/u. States she has been  doing well since last visit.   ?Dyspnea:  working out 30 min sev week no doe  ?Cough: gone   ?Sleeping: better p raised hob / diet  ?SABA use: none  ?02: none  ?  ? ? ?No obvious day to day or daytime variability or assoc excess/ purulent sputum or mucus plugs or hemoptysis or cp or chest tightness, subjective wheeze or overt sinus or hb symptoms.  ? ?Sleeping  without nocturnal  or early am exacerbation  of respiratory  c/o's or need for noct saba. Also denies any obvious fluctuation of symptoms with weather or environmental changes or other aggravating or alleviating factors except as outlined above  ? ?No unusual exposure hx or h/o childhood pna/   knowledge of premature birth. ? ?Current Allergies, Complete Past Medical History, Past Surgical History, Family History, and Social History were reviewed in [employer] record. ? ?ROS  The following are not active complaints unless bolded ?Hoarseness, sore throat, dysphagia, dental problems, itching, sneezing,  nasal congestion or discharge of excess mucus or purulent secretions, ear ache,   fever, chills, sweats, unintended wt loss or wt gain, classically pleuritic or exertional cp,  orthopnea pnd or arm/hand swelling  or leg swelling, presyncope, palpitations, abdominal pain, anorexia, nausea, vomiting, diarrhea  or change in bowel habits or change in bladder habits, change in stools or change in urine, dysuria, hematuria,  rash, arthralgias, visual complaints, headache, numbness, weakness or ataxia or problems with walking or coordination,  change in mood or  memory. ?      ? ?Current Meds  ?Medication Sig  ? albuterol (PROVENTIL HFA;VENTOLIN HFA) 108 (90 BASE) MCG/ACT inhaler Inhale 2 puffs into the lungs every 6 (six) hours as needed for wheezing or shortness of breath.  ? amitriptyline (ELAVIL) 25 MG tablet Take 1 tablet (25 mg total) by mouth at bedtime.  ? benzonatate (TESSALON) 100 MG capsule Take 2 capsules (200 mg total) by mouth 3  (three) times daily as needed for cough.  ? esomeprazole (NEXIUM) 40 MG capsule TAKE ONE CAPSULE BY MOUTH TWICE DAILY, 30 TO 60 MINUTES BEFORE FIRST AND LAST MEAL OF THE DAY (Patient taking differently:

## 2021-10-29 NOTE — Patient Instructions (Signed)
Pulmonary follow up is not needed  ? ?Follow per GI doctor  ?

## 2021-10-30 ENCOUNTER — Encounter: Payer: Self-pay | Admitting: Internal Medicine

## 2021-10-30 NOTE — Assessment & Plan Note (Signed)
Onset around 2015 ?-  MRI sinus  05/07/17 Sinuses/Orbits:  . Paranasal ?sinuses clear. No mastoid effusion. Inner ear structures normal. ?- FENO 11/03/2017  =   5 on dulera 200 2bid with no improvement in cough > d/c'd 11/03/2017  ?-  Allergy profile 11/03/2017 >  Eos 0.0 /  IgE  83 RAST pos cockroach only  ?- cyclical cough regimen 01/11/6788 > not adherent ?- FENO 11/17/2017  =    7 on no rx   ?- 12/04/2017 did not try 1st gen H1 > try chlorpheniramine and MCT done 12/15/17 neg for asthma ?- ENT eval 02/25/18 > neg [facility]/ [facility]eman re-eval 07/14/19 rec allergy eval/cxr/consier NF  ?- 07/26/2019 returned to pulmonary clinic s meds as reqeuested > cxr nl  ?- DgEs  08/12/2019 >>> 1. Small hiatal hernia. ?2. Mild esophageal dysmotility, likely presbyesophagus. ?3. Spontaneous gastroesophageal reflux with water swallows > rec GI eval  ? Chest HRCT  08/23/21 ?1. No evidence of fibrotic interstitial lung disease. ?2. Mild air trapping, findings can be seen in the setting of small ?airways disease. ?- 08/26/2021 rec max gerd rx/ 1st gen H1 blockers per guidelines  / bed elevation and f/u in 6  weeks ? Refer for NF ? ?Adequate control on present rx, reviewed in detail with pt > no change in rx needed  = gerd rx/ gi f/u Dr Michail Sermon planned, ok to put off NF for now from my perspective ? ?Pulmonary f/u is prn  ? ?    ?  ? ?Each maintenance medication was reviewed in detail including emphasizing most importantly the difference between maintenance and prns and under what circumstances the prns are to be triggered using an action plan format where appropriate. ? ?Total time for H and P, chart review, counseling, reviewing hfa device(s) and generating customized AVS unique to this summary  office visit / same day charting =  21 minutes ?     ?

## 2021-12-05 ENCOUNTER — Other Ambulatory Visit: Payer: Self-pay | Admitting: Primary Care

## 2021-12-24 ENCOUNTER — Ambulatory Visit (INDEPENDENT_AMBULATORY_CARE_PROVIDER_SITE_OTHER): Payer: Medicare Other | Admitting: Neurology

## 2021-12-24 ENCOUNTER — Encounter: Payer: Self-pay | Admitting: Neurology

## 2021-12-24 VITALS — BP 114/74 | HR 71 | Ht 62.0 in | Wt 138.0 lb

## 2021-12-24 DIAGNOSIS — G43709 Chronic migraine without aura, not intractable, without status migrainosus: Secondary | ICD-10-CM | POA: Diagnosis not present

## 2021-12-24 MED ORDER — ZONISAMIDE 100 MG PO CAPS: 100.0000 mg | ORAL_CAPSULE | Freq: Two times a day (BID) | ORAL | 4 refills | Status: DC

## 2021-12-24 MED ORDER — SUMATRIPTAN SUCCINATE 6 MG/0.5ML ~~LOC~~ SOAJ: SUBCUTANEOUS | 6 refills | Status: DC

## 2021-12-24 MED ORDER — AMITRIPTYLINE HCL 25 MG PO TABS: 25.0000 mg | ORAL_TABLET | Freq: Every day | ORAL | 4 refills | Status: DC

## 2021-12-24 MED ORDER — ONDANSETRON 4 MG PO TBDP: 4.0000 mg | ORAL_TABLET | Freq: Three times a day (TID) | ORAL | 2 refills | Status: DC | PRN

## 2021-12-24 MED ORDER — RIZATRIPTAN BENZOATE 10 MG PO TBDP: ORAL_TABLET | ORAL | 6 refills | Status: DC

## 2021-12-24 NOTE — Progress Notes (Signed)
PATIENT: Kerry Ortiz DOB: 09/27/1965  Chief Complaint  Patient presents with   Follow-up    Room 12, alone states she is stable, reports 2 migraines a month    HISTORICAL  Kerry Ortiz is a 56 year old female, seen in refer by her primary care doctor Merrilee Seashore for evaluation of chronic migraine, initial evaluation was on April 23 2017.  I reviewed and summarized the referring note, she had past medical history of acid reflux, fibromyalgia, prediabetes, hyperlipidemia, chronic migraine headaches,  She reported history of headaches since age 73, her typical migraine starting from occipital region spreading forward pounding severe headache, associated light noise sensitivity, lasting for a few hours, she has tried over-the-counter Tylenol ibuprofen with limited help, she is now getting more frequent headaches since July 2018 about 2-3 times each week,  She has been evaluated by headache [facility] in the past, still taking nortriptyline 25 mg 2 tablets every night, zonisamide 25 mg 3 tablets night as preventative medications,  I saw her previously for diffuse body achy pain, paresthesias throughout her body in 2017,   she had a history of fibromyalgia, carpal tunnel release surgery in the past, chronic migraines, she went on disability since 2008, complains of diffuse body achy pain, on polypharmacy treatment, including Elavil, Lyrica, Zonegran, She continue complains of depression symptoms, tearful during today's interview   Since 2012, she began to notice bilateral feet cold burning sensation, involving bottom and top of her feet, gradually trending up to knee level, getting worse with pressure, barium weight, she has chronic low back and neck pain, denies radiating pain, gait difficulty due to pain, no bowel and bladder incontinence, no bilateral upper extremity paresthesia or weakness,   She complains of feeling tired all the time, lack of interest,  laboratory  in July 2016, A1c was elevated 5.9, normal B12 567, TSH 1.4, CMP, she had more laboratory evaluation in August 2016  She is on polypharmacy treatment, taking Lyrica 150 mg twice a day, Celexa 10 mg daily, she still has intermittent bilateral feet paresthesia, no significant pain  EMG nerve conduction study in December 2016, there is no evidence of large fiber peripheral neuropathy   laboratory evaluation in November 2016:normal CBC, mildly low potassium on BMP 3.4  UPDATE Aug 20 2017: She was admitted to the hospital Oct 4 to 01/2017 with acute onset of dizziness, left lower extremity weakness, had extensive evaluations,  MRI of the brain in October 2018, that was normal.  Laboratory evaluation in October 2018, showed mild anemia hemoglobin of 11.3, CMP showed creatinine 0.85, normal TSH, A1c was 5.8, normal lipid profile, LDL was 90, negative ANA, CPK, ESR, HIV, troponin,  Echocardiogram was normal, Ultrasound of carotid artery showed less than 39% stenosis bilaterally.  Since that visit, she has intermittent dizziness, eye are twitching, ears are hurting.  UPDATE February 08 2019: She continue complains of occasionally migraine headaches, well controlled by Maxalt, she is still taking preventive medication Zonegran 100 mg twice a day, Elavil 25 mg at bedtime  She also complains of diffuse body achy pain, the care of rheumatologist, Plaquenil 400 mg daily,  She also complains 4 months history of worsening right-sided low back pain, radiating pain to right lower extremity, sometimes difficulty get out of bed  UPDATE Jun 26 2020: She has headache last for days in Sept 2021, on left side, tightness, ball up into fetal position, [facility], was told it is tension headaches, was given Toradol  shot, last for 2 days that helped.  She has tried Maxalt help some.   She start to keep diary and food, her headaches has improves since.   She has not had headache for one month.  She was  having headaches once very 3-4 weeks ,   I personally reviewed CT head wo in August 2021 that was normal. Laboratory evaluation in Sept 2021 showed normal CMP, CBC Hg 13.5, INR 1.0  Update January 16, 2021 SS: Here today alone, reports for 2 weeks, changes to right-sided vision, the right peripheral half of the vision, is dark, like string of hair or smoke. No pain. No double vision, it moves around. Noted to swat around her eye, like bug flying around.  Headaches remain well controlled, on Zonegran, amitriptyline.  On average 1 headache a month.  Takes Maxalt with good benefit.  Has yet to use Imitrex injection, but does need needles and syringes.  On Plaquenil for RA.  Update Dec 24, 2021 SS: Here today alone, having 2 migraines a month, for acute migraine, will do Imitrex injection, with benefit, for milder headaches will use Maxalt. Went to the eye doctor last year for vision change, no problem found, symptoms resolved. Has nagging cough, started Nexium.  Continues on Zonegran 100 mg twice a day, amitriptyline 25 mg at bedtime for migraine prevention.  Happy with her current headache control regimen.  REVIEW OF SYSTEMS: Full 14 system review of systems performed and notable only for as above  See HPI  ALLERGIES: Allergies  Allergen Reactions   Amoxicillin     Yeast infections    HOME MEDICATIONS: Current Outpatient Medications  Medication Sig Dispense Refill   albuterol (PROVENTIL HFA;VENTOLIN HFA) 108 (90 BASE) MCG/ACT inhaler Inhale 2 puffs into the lungs every 6 (six) hours as needed for wheezing or shortness of breath. 1 Inhaler 2   esomeprazole (NEXIUM) 40 MG capsule TAKE ONE CAPSULE BY MOUTH TWICE DAILY, 30 TO 60 MINUTES BEFORE FIRST AND LAST MEAL OF THE DAY (Patient taking differently: Take 40 mg by mouth 2 (two) times daily before a meal.) 180 capsule 0   fluticasone (FLONASE) 50 MCG/ACT nasal spray Place 2 sprays into both nostrils daily. 16 g 0   guaiFENesin-dextromethorphan  (ROBITUSSIN DM) 100-10 MG/5ML syrup Take 5 mLs by mouth every 4 (four) hours as needed for cough.     hydroxychloroquine (PLAQUENIL) 200 MG tablet Take 400 mg by mouth daily.      ibuprofen (ADVIL) 600 MG tablet Take 1 tablet (600 mg total) by mouth every 6 (six) hours as needed. 30 tablet 0   levocetirizine (XYZAL) 5 MG tablet TAKE 1 TABLET(5 MG) BY MOUTH EVERY EVENING 30 tablet 3   meclizine (ANTIVERT) 25 MG tablet Take 1 tablet (25 mg total) by mouth 3 (three) times daily as needed for dizziness. 12 tablet 0   montelukast (SINGULAIR) 10 MG tablet Take 10 mg by mouth at bedtime.      pregabalin (LYRICA) 200 MG capsule Take 200 mg by mouth 2 (two) times daily.     sucralfate (CARAFATE) 1 G tablet Take 1 tablet (1 g total) by mouth 4 (four) times daily. (Patient taking differently: Take 1 g by mouth daily.) 20 tablet 0   tiZANidine (ZANAFLEX) 4 MG tablet Take 4 mg by mouth every 6 (six) hours as needed for muscle spasms.     amitriptyline (ELAVIL) 25 MG tablet Take 1 tablet (25 mg total) by mouth at bedtime. 90 tablet 4  ondansetron (ZOFRAN ODT) 4 MG disintegrating tablet Take 1 tablet (4 mg total) by mouth every 8 (eight) hours as needed. 20 tablet 2   rizatriptan (MAXALT-MLT) 10 MG disintegrating tablet Take 1 tab at onset of migraine.  May repeat in 2 hrs, if needed.  Max dose: 2 tabs/day. This is a 30 day prescription. 10 tablet 6   SUMAtriptan 6 MG/0.5ML SOAJ Take 1 injection at onset of migraine.  May repeat in 2 hrs, if needed.  Max dose: 2 injections/day. This is a 30 day prescription. 5 mL 6   zonisamide (ZONEGRAN) 100 MG capsule Take 1 capsule (100 mg total) by mouth 2 (two) times daily. 180 capsule 4   No current facility-administered medications for this visit.    PAST MEDICAL HISTORY: Past Medical History:  Diagnosis Date   Acid reflux    Asthma    Disturbance of skin sensation    Fibromyalgia    Migraine    Uterine fibroid     PAST SURGICAL HISTORY: Past Surgical History:   Procedure Laterality Date   CARPAL TUNNEL RELEASE     CYSTECTOMY     HAND SURGERY Right    SHOULDER SURGERY Right     FAMILY HISTORY: Family History  Problem Relation Age of Onset   Hypertension Mother    Arrhythmia Mother    Thyroid disease Mother    Pneumonia Father    Diabetes Maternal Grandmother    Kidney disease Sister    Multiple sclerosis Cousin     SOCIAL HISTORY:  Social History   Socioeconomic History   Marital status: Single    Spouse name: Not on file   Number of children: 1   Years of education: HS   Highest education level: Not on file  Occupational History   Occupation: Disabled  Tobacco Use   Smoking status: Never   Smokeless tobacco: Never  Vaping Use   Vaping Use: Never used  Substance and Sexual Activity   Alcohol use: No   Drug use: No   Sexual activity: Not Currently    Birth control/protection: None  Other Topics Concern   Not on file  Social History Narrative   Lives at home alone.   Right-handed.   No caffeine use.   Social Determinants of Health   Financial Resource Strain: Not on file  Food Insecurity: Not on file  Transportation Needs: Not on file  Physical Activity: Not on file  Stress: Not on file  Social Connections: Not on file  Intimate Partner Violence: Not on file   PHYSICAL EXAM   Vitals:   12/24/21 1352  BP: 114/74  Pulse: 71  Weight: 138 lb (62.6 kg)  Height: '5\' 2"'  (1.575 m)    Not recorded     Body mass index is 25.24 kg/m.  PHYSICAL EXAMNIATION:  Physical Exam  General: The patient is alert and cooperative at the time of the examination.   Skin: No significant peripheral edema is noted.  Neurologic Exam  Mental status: The patient is alert and oriented x 3 at the time of the examination. The patient has apparent normal recent and remote memory, with an apparently normal attention span and concentration ability.  Cranial nerves: Facial symmetry is present. Speech is normal, no aphasia or  dysarthria is noted. Extraocular movements are full. Visual fields are full.  Motor: The patient has good strength in all 4 extremities.  Sensory examination: Soft touch sensation is symmetric on the face, arms, and legs.  Coordination: The  patient has good finger-nose-finger and heel-to-shin bilaterally.  Gait and station: The patient has a normal gait. Tandem gait is normal.   Reflexes: Deep tendon reflexes are symmetric.  CRANIAL NERVES:  DIAGNOSTIC DATA (LABS, IMAGING, TESTING) - I reviewed patient records, labs, notes, testing and imaging myself where available.  ASSESSMENT AND PLAN  Kerry Ortiz is a 56 y.o. female   Chronic migraine headache  -Under good control, about 2 migraines a month -Continue Zonegran, amitriptyline for migraine prevention -Continue Maxalt as needed for mild to moderate acute headaches, Imitrex injection for severe headache, may combine with Zofran, NSAIDs, tizanidine -Follow-up in 1 year or sooner if needed  Meds ordered this encounter  Medications   zonisamide (ZONEGRAN) 100 MG capsule    Sig: Take 1 capsule (100 mg total) by mouth 2 (two) times daily.    Dispense:  180 capsule    Refill:  4   SUMAtriptan 6 MG/0.5ML SOAJ    Sig: Take 1 injection at onset of migraine.  May repeat in 2 hrs, if needed.  Max dose: 2 injections/day. This is a 30 day prescription.    Dispense:  5 mL    Refill:  6   rizatriptan (MAXALT-MLT) 10 MG disintegrating tablet    Sig: Take 1 tab at onset of migraine.  May repeat in 2 hrs, if needed.  Max dose: 2 tabs/day. This is a 30 day prescription.    Dispense:  10 tablet    Refill:  6   amitriptyline (ELAVIL) 25 MG tablet    Sig: Take 1 tablet (25 mg total) by mouth at bedtime.    Dispense:  90 tablet    Refill:  4   ondansetron (ZOFRAN ODT) 4 MG disintegrating tablet    Sig: Take 1 tablet (4 mg total) by mouth every 8 (eight) hours as needed.    Dispense:  20 tablet    Refill:  2   Evangeline Dakin, DNP   [facility] [address], [place], [place] [address] [phone]

## 2021-12-24 NOTE — Patient Instructions (Signed)
Meds ordered this encounter  Medications   zonisamide (ZONEGRAN) 100 MG capsule    Sig: Take 1 capsule (100 mg total) by mouth 2 (two) times daily.    Dispense:  180 capsule    Refill:  4   SUMAtriptan 6 MG/0.5ML SOAJ    Sig: Take 1 injection at onset of migraine.  May repeat in 2 hrs, if needed.  Max dose: 2 injections/day. This is a 30 day prescription.    Dispense:  5 mL    Refill:  6   rizatriptan (MAXALT-MLT) 10 MG disintegrating tablet    Sig: Take 1 tab at onset of migraine.  May repeat in 2 hrs, if needed.  Max dose: 2 tabs/day. This is a 30 day prescription.    Dispense:  10 tablet    Refill:  6   amitriptyline (ELAVIL) 25 MG tablet    Sig: Take 1 tablet (25 mg total) by mouth at bedtime.    Dispense:  90 tablet    Refill:  4   ondansetron (ZOFRAN ODT) 4 MG disintegrating tablet    Sig: Take 1 tablet (4 mg total) by mouth every 8 (eight) hours as needed.    Dispense:  20 tablet    Refill:  2

## 2021-12-31 ENCOUNTER — Telehealth: Payer: Self-pay | Admitting: Neurology

## 2021-12-31 NOTE — Telephone Encounter (Signed)
Pt called and LVM saying that she received a letter stating that she missed her appt on 12/24/21 but that she does not understand why she received that letter when she did in fact come in for her appt.  According to notes in pt's chart she was indeed seen by Butler Denmark, NP on 12/24/21 Pt was called and is wanting to have this changed in her chart so that it reflects that she was seen and not have a unnecessary No Show on her record. Please advise.

## 2022-05-08 ENCOUNTER — Other Ambulatory Visit: Payer: Self-pay | Admitting: *Deleted

## 2022-05-08 MED ORDER — LEVOCETIRIZINE DIHYDROCHLORIDE 5 MG PO TABS: 5.0000 mg | ORAL_TABLET | Freq: Every evening | ORAL | 3 refills | Status: AC

## 2022-06-10 DIAGNOSIS — Z23 Encounter for immunization: Secondary | ICD-10-CM | POA: Diagnosis not present

## 2022-07-04 DIAGNOSIS — M898X1 Other specified disorders of bone, shoulder: Secondary | ICD-10-CM | POA: Diagnosis not present

## 2022-08-14 DIAGNOSIS — M65312 Trigger thumb, left thumb: Secondary | ICD-10-CM | POA: Diagnosis not present

## 2022-08-20 ENCOUNTER — Other Ambulatory Visit: Payer: Self-pay | Admitting: Primary Care

## 2022-09-11 DIAGNOSIS — M65312 Trigger thumb, left thumb: Secondary | ICD-10-CM | POA: Diagnosis not present

## 2022-09-17 ENCOUNTER — Other Ambulatory Visit: Payer: Self-pay | Admitting: Internal Medicine

## 2022-09-24 DIAGNOSIS — R768 Other specified abnormal immunological findings in serum: Secondary | ICD-10-CM | POA: Diagnosis not present

## 2022-09-24 DIAGNOSIS — M2550 Pain in unspecified joint: Secondary | ICD-10-CM | POA: Diagnosis not present

## 2022-09-24 DIAGNOSIS — M79643 Pain in unspecified hand: Secondary | ICD-10-CM | POA: Diagnosis not present

## 2022-09-24 DIAGNOSIS — M797 Fibromyalgia: Secondary | ICD-10-CM | POA: Diagnosis not present

## 2022-09-24 DIAGNOSIS — R5383 Other fatigue: Secondary | ICD-10-CM | POA: Diagnosis not present

## 2022-11-01 ENCOUNTER — Encounter (HOSPITAL_COMMUNITY): Payer: Self-pay

## 2022-11-01 ENCOUNTER — Ambulatory Visit (HOSPITAL_COMMUNITY)
Admission: EM | Admit: 2022-11-01 | Discharge: 2022-11-01 | Disposition: A | Discharge status: Home / Self Care | Payer: 59 | Attending: Internal Medicine | Admitting: Internal Medicine

## 2022-11-01 DIAGNOSIS — K2100 Gastro-esophageal reflux disease with esophagitis, without bleeding: Secondary | ICD-10-CM | POA: Diagnosis not present

## 2022-11-01 DIAGNOSIS — J029 Acute pharyngitis, unspecified: Secondary | ICD-10-CM | POA: Diagnosis not present

## 2022-11-01 MED ORDER — FAMOTIDINE 20 MG PO TABS: 20.0000 mg | ORAL_TABLET | Freq: Two times a day (BID) | ORAL | 0 refills | Status: DC

## 2022-11-01 MED ORDER — ALUM & MAG HYDROXIDE-SIMETH 200-200-20 MG/5ML PO SUSP
ORAL | Status: AC
  Filled 2022-11-01: qty 30

## 2022-11-01 MED ORDER — LIDOCAINE VISCOUS HCL 2 % MT SOLN
15.0000 mL | Freq: Once | OROMUCOSAL | Status: AC
  Administered 2022-11-01: 15 mL via OROMUCOSAL

## 2022-11-01 MED ORDER — ALUM & MAG HYDROXIDE-SIMETH 200-200-20 MG/5ML PO SUSP
30.0000 mL | Freq: Once | ORAL | Status: AC
  Administered 2022-11-01: 30 mL via ORAL

## 2022-11-01 MED ORDER — LIDOCAINE VISCOUS HCL 2 % MT SOLN
OROMUCOSAL | Status: AC
  Filled 2022-11-01: qty 15

## 2022-11-01 NOTE — Discharge Instructions (Addendum)
Take prescribed medicines as directed. Medicine will help reduce the amount of acid your stomach makes and therefore improve your reflux symptoms related to acid production.   Avoid spicy or acidic foods like tomatoes, chocolate, coffee, or acidic fruits like oranges as these can trigger symptoms.  I have included acid reflux education in your packet for your review. Please also allow 2 hours after meals before lying flat to help prevent symptoms.   If your symptoms do not improve in the next 5-7 days with interventions, please return. Go to the emergency room for severe symptoms of shortness of breath, worsening or uncontrolled abdominal or chest pain, headache, light headedness, feeling faint, nausea, vomiting, bloody vomit or stools, black tarry stools, or any other new/severe symptoms. I hope you feel better!   

## 2022-11-01 NOTE — ED Provider Notes (Signed)
[facility]    CSN: [id number] Arrival date & time: 11/01/22  1631      History   Chief Complaint Chief Complaint  Patient presents with   Sore Throat    HPI Kerry Ortiz is a 57 y.o. female.   Patient presents to urgent care for evaluation of burning sensation to the central chest that radiates to the throat that started 2-3 days ago. Chest/throat discomfort is constant, but worsens and improves in intensity intermittently. States this feels similar to the last time she experienced a flare up of GERD. She denies heart palpitations, shortness of breath, cough, fever/chills, left arm/jaw pain, nausea, vomiting, dizziness, headache, abdominal pain, diarrhea, and recent intake of acidic/spicy foods. She takes a PPI twice daily and has not missed any doses. Denies recent increase in stress. Unsure of trigger/relieving factor for symptoms. Cannot correlate symptoms to eating. Denies recent intake of NSAID, coffee, tomatoes, etc. Has not used any other OTC medicines to help with burning to the chest.   Sore Throat    Past Medical History:  Diagnosis Date   Acid reflux    Asthma    Disturbance of skin sensation    Fibromyalgia    Migraine    Uterine fibroid     Patient Active Problem List   Diagnosis Date Noted   Vision changes 01/16/2021   Chronic migraine w/o aura w/o status migrainosus, not intractable 06/26/2020   Chronic right-sided low back pain with right-sided sciatica 06/26/2020   Atypical chest pain 01/06/2020   Diffuse pain 02/08/2019   Right lumbar radiculopathy 02/08/2019   Upper airway cough syndrome 11/03/2017   Leg weakness 05/07/2017   Hypokalemia 05/07/2017   Anemia 05/07/2017   Vertigo 05/07/2017   Chronic migraine 04/23/2017   Paresthesia 02/07/2016   ABDOMINAL BLOATING 11/08/2008   EPIGASTRIC PAIN 11/08/2008   DEPRESSION 10/31/2008   OSTEOARTHRITIS 10/31/2008   FIBROMYALGIA 10/31/2008   HEADACHE, CHRONIC 10/31/2008   ABDOMINAL PAIN,  RECURRENT 10/31/2008   CARPAL TUNNEL SYNDROME, HX OF 10/31/2008    Past Surgical History:  Procedure Laterality Date   CARPAL TUNNEL RELEASE     CYSTECTOMY     HAND SURGERY Right    SHOULDER SURGERY Right     OB History   No obstetric history on file.      Home Medications    Prior to Admission medications   Medication Sig Start Date End Date Taking? Authorizing Provider  albuterol (PROVENTIL HFA;VENTOLIN HFA) 108 (90 BASE) MCG/ACT inhaler Inhale 2 puffs into the lungs every 6 (six) hours as needed for wheezing or shortness of breath. 10/24/14  Yes Gregor Hams, MD  amitriptyline (ELAVIL) 25 MG tablet Take 1 tablet (25 mg total) by mouth at bedtime. 12/24/21  Yes Suzzanne Cloud, NP  esomeprazole (NEXIUM) 40 MG capsule TAKE ONE CAPSULE BY MOUTH TWICE DAILY, 30 TO 60 MINUTES BEFORE FIRST AND LAST MEAL OF THE DAY Patient taking differently: Take 40 mg by mouth 2 (two) times daily before a meal. 09/13/19   Tanda Rockers, MD  famotidine (PEPCID) 20 MG tablet Take 1 tablet (20 mg total) by mouth 2 (two) times daily. 11/01/22   Talbot Grumbling, FNP  fluticasone (FLONASE) 50 MCG/ACT nasal spray Place 2 sprays into both nostrils daily. 02/14/21   Melynda Ripple, MD  guaiFENesin-dextromethorphan (ROBITUSSIN DM) 100-10 MG/5ML syrup Take 5 mLs by mouth every 4 (four) hours as needed for cough.    [provider]  hydroxychloroquine (PLAQUENIL) 200 MG  tablet Take 400 mg by mouth daily.     [provider]  ibuprofen (ADVIL) 600 MG tablet Take 1 tablet (600 mg total) by mouth every 6 (six) hours as needed. 02/14/21   Melynda Ripple, MD  levocetirizine (XYZAL) 5 MG tablet Take 1 tablet (5 mg total) by mouth every evening. 05/08/22   Tanda Rockers, MD  meclizine (ANTIVERT) 25 MG tablet Take 1 tablet (25 mg total) by mouth 3 (three) times daily as needed for dizziness. 03/23/20   Palumbo, April, MD  montelukast (SINGULAIR) 10 MG tablet Take 10 mg by mouth at bedtime.   04/28/19   [provider]  ondansetron (ZOFRAN ODT) 4 MG disintegrating tablet Take 1 tablet (4 mg total) by mouth every 8 (eight) hours as needed. 12/24/21   Suzzanne Cloud, NP  pregabalin (LYRICA) 200 MG capsule Take 200 mg by mouth 2 (two) times daily.    [provider]  rizatriptan (MAXALT-MLT) 10 MG disintegrating tablet Take 1 tab at onset of migraine.  May repeat in 2 hrs, if needed.  Max dose: 2 tabs/day. This is a 30 day prescription. 12/24/21   Suzzanne Cloud, NP  sucralfate (CARAFATE) 1 G tablet Take 1 tablet (1 g total) by mouth 4 (four) times daily. Patient taking differently: Take 1 g by mouth daily. 07/11/12   Malvin Johns, MD  SUMAtriptan 6 MG/0.5ML SOAJ Take 1 injection at onset of migraine.  May repeat in 2 hrs, if needed.  Max dose: 2 injections/day. This is a 30 day prescription. 12/24/21   Suzzanne Cloud, NP  tiZANidine (ZANAFLEX) 4 MG tablet Take 4 mg by mouth every 6 (six) hours as needed for muscle spasms.    [provider]  zonisamide (ZONEGRAN) 100 MG capsule Take 1 capsule (100 mg total) by mouth 2 (two) times daily. 12/24/21   Suzzanne Cloud, NP    Family History Family History  Problem Relation Age of Onset   Hypertension Mother    Arrhythmia Mother    Thyroid disease Mother    Pneumonia Father    Diabetes Maternal Grandmother    Kidney disease Sister    Multiple sclerosis Cousin     Social History Social History   Tobacco Use   Smoking status: Never   Smokeless tobacco: Never  Vaping Use   Vaping Use: Never used  Substance Use Topics   Alcohol use: No   Drug use: No     Allergies   Amoxicillin   Review of Systems Review of Systems Per HPI  Physical Exam Triage Vital Signs ED Triage Vitals [11/01/22 1737]  Enc Vitals Group     BP 118/64     Pulse Rate 65     Resp 16     Temp 98.1 F (36.7 C)     Temp Source Oral     SpO2 100 %     Weight      Height      Head Circumference      Peak Flow      Pain Score       Pain Loc      Pain Edu?      Excl. in [place]?    No data found.  Updated Vital Signs BP 118/64 (BP Location: Left Arm)   Pulse 65   Temp 98.1 F (36.7 C) (Oral)   Resp 16   LMP  (LMP Unknown) Comment: pt states before 2020  SpO2 100%   Visual Acuity  Right Eye Distance:   Left Eye Distance:   Bilateral Distance:    Right Eye Near:   Left Eye Near:    Bilateral Near:     Physical Exam Vitals and nursing note reviewed.  Constitutional:      Appearance: She is not ill-appearing or toxic-appearing.  HENT:     Head: Normocephalic and atraumatic.     Right Ear: Hearing and external ear normal.     Left Ear: Hearing and external ear normal.     Nose: Nose normal.     Mouth/Throat:     Lips: Pink.     Mouth: Mucous membranes are moist. No injury.     Tongue: No lesions. Tongue does not deviate from midline.     Palate: No mass and lesions.     Pharynx: Oropharynx is clear. Uvula midline. No pharyngeal swelling, oropharyngeal exudate, posterior oropharyngeal erythema or uvula swelling.     Tonsils: No tonsillar exudate or tonsillar abscesses.  Eyes:     General: Lids are normal. Vision grossly intact. Gaze aligned appropriately.     Extraocular Movements: Extraocular movements intact.     Conjunctiva/sclera: Conjunctivae normal.  Cardiovascular:     Rate and Rhythm: Normal rate and regular rhythm.     Heart sounds: Normal heart sounds, S1 normal and S2 normal.  Pulmonary:     Effort: Pulmonary effort is normal. No respiratory distress.     Breath sounds: Normal breath sounds and air entry. No decreased breath sounds, wheezing, rhonchi or rales.  Chest:     Chest wall: No tenderness.  Musculoskeletal:     Cervical back: Neck supple.  Skin:    General: Skin is warm and dry.     Capillary Refill: Capillary refill takes less than 2 seconds.     Findings: No rash.  Neurological:     General: No focal deficit present.     Mental Status: She is alert and oriented to  person, place, and time. Mental status is at baseline.     Cranial Nerves: No dysarthria or facial asymmetry.  Psychiatric:        Mood and Affect: Mood normal.        Speech: Speech normal.        Behavior: Behavior normal.        Thought Content: Thought content normal.        Judgment: Judgment normal.      UC Treatments / Results  Labs (all labs ordered are listed, but only abnormal results are displayed) Labs Reviewed - No data to display  EKG   Radiology No results found.  Procedures Procedures (including critical care time)  Medications Ordered in UC Medications  alum & mag hydroxide-simeth (MAALOX/MYLANTA) 200-200-20 MG/5ML suspension 30 mL (30 mLs Oral Given 11/01/22 1828)  lidocaine (XYLOCAINE) 2 % viscous mouth solution 15 mL (15 mLs Mouth/Throat Given 11/01/22 1828)    Initial Impression / Assessment and Plan / UC Course  I have reviewed the triage vital signs and the nursing notes.  Pertinent labs & imaging results that were available during my care of the patient were reviewed by me and considered in my medical decision making (see chart for details).   1. Chronic reflux esophagitis, sore throat Presentation consistent with acute exacerbation of chronic reflux esophagitis. Sore throat likely due to excessive acid production. No cough, lungs clear. No indication for strep testing, does not meet [facility] criteria and afebrile. GI cocktail given in clinic with improvement in symptoms.  Low suspicion for cardiac etiology of symptoms. Advised to continue nexium BID. Will add on famotidine 20mg  BID. Advised follow-up with PCP for further workup and evaluation of GERD symptoms as she may benefit from GI referral for endoscopy if symptoms persist/fail to improve. She is agreeable with this plan.   Discussed physical exam and available lab work findings in clinic with patient.  Counseled patient regarding appropriate use of medications and potential side effects for all  medications recommended or prescribed today. Discussed red flag signs and symptoms of worsening condition,when to call the PCP office, return to urgent care, and when to seek higher level of care in the emergency department. Patient verbalizes understanding and agreement with plan. All questions answered. Patient discharged in stable condition.    Final Clinical Impressions(s) / UC Diagnoses   Final diagnoses:  Chronic reflux esophagitis  Sore throat     Discharge Instructions      Take prescribed medicines as directed. Medicine will help reduce the amount of acid your stomach makes and therefore improve your reflux symptoms related to acid production.   Avoid spicy or acidic foods like tomatoes, chocolate, coffee, or acidic fruits like oranges as these can trigger symptoms.  I have included acid reflux education in your packet for your review. Please also allow 2 hours after meals before lying flat to help prevent symptoms.   If your symptoms do not improve in the next 5-7 days with interventions, please return. Go to the emergency room for severe symptoms of shortness of breath, worsening or uncontrolled abdominal or chest pain, headache, light headedness, feeling faint, nausea, vomiting, bloody vomit or stools, black tarry stools, or any other new/severe symptoms. I hope you feel better!     ED Prescriptions     Medication Sig Dispense Auth. Provider   famotidine (PEPCID) 20 MG tablet  (Status: Discontinued) Take 1 tablet (20 mg total) by mouth 2 (two) times daily. 30 tablet Talbot Grumbling, FNP   famotidine (PEPCID) 20 MG tablet Take 1 tablet (20 mg total) by mouth 2 (two) times daily. 60 tablet Talbot Grumbling, FNP      PDMP not reviewed this encounter.   Joella Prince [place], [place] 11/04/22 [phone]

## 2022-11-01 NOTE — ED Triage Notes (Signed)
Pt is here for chest burning x 2 days. Pt states she has a history of reflux.  Pt reports her sore throat x 2 days.

## 2022-11-11 DIAGNOSIS — K219 Gastro-esophageal reflux disease without esophagitis: Secondary | ICD-10-CM | POA: Diagnosis not present

## 2022-11-11 DIAGNOSIS — K2930 Chronic superficial gastritis without bleeding: Secondary | ICD-10-CM | POA: Diagnosis not present

## 2022-11-11 DIAGNOSIS — K449 Diaphragmatic hernia without obstruction or gangrene: Secondary | ICD-10-CM | POA: Diagnosis not present

## 2022-11-11 DIAGNOSIS — K2100 Gastro-esophageal reflux disease with esophagitis, without bleeding: Secondary | ICD-10-CM | POA: Diagnosis not present

## 2022-11-18 DIAGNOSIS — K2930 Chronic superficial gastritis without bleeding: Secondary | ICD-10-CM | POA: Diagnosis not present

## 2022-11-18 DIAGNOSIS — K2100 Gastro-esophageal reflux disease with esophagitis, without bleeding: Secondary | ICD-10-CM | POA: Diagnosis not present

## 2022-11-20 DIAGNOSIS — Z79620 Long term (current) use of immunosuppressive biologic: Secondary | ICD-10-CM | POA: Diagnosis not present

## 2022-11-20 DIAGNOSIS — H35033 Hypertensive retinopathy, bilateral: Secondary | ICD-10-CM | POA: Diagnosis not present

## 2022-11-20 DIAGNOSIS — H04123 Dry eye syndrome of bilateral lacrimal glands: Secondary | ICD-10-CM | POA: Diagnosis not present

## 2022-11-20 DIAGNOSIS — H35371 Puckering of macula, right eye: Secondary | ICD-10-CM | POA: Diagnosis not present

## 2022-11-20 DIAGNOSIS — H25813 Combined forms of age-related cataract, bilateral: Secondary | ICD-10-CM | POA: Diagnosis not present

## 2022-11-26 ENCOUNTER — Ambulatory Visit (HOSPITAL_COMMUNITY)
Admission: EM | Admit: 2022-11-26 | Discharge: 2022-11-26 | Disposition: A | Discharge status: Home / Self Care | Payer: 59 | Attending: Family Medicine | Admitting: Family Medicine

## 2022-11-26 ENCOUNTER — Other Ambulatory Visit: Payer: Self-pay

## 2022-11-26 ENCOUNTER — Encounter (HOSPITAL_COMMUNITY): Payer: Self-pay | Admitting: Emergency Medicine

## 2022-11-26 DIAGNOSIS — K219 Gastro-esophageal reflux disease without esophagitis: Secondary | ICD-10-CM

## 2022-11-26 MED ORDER — SUCRALFATE 1 G PO TABS: 1.0000 g | ORAL_TABLET | Freq: Three times a day (TID) | ORAL | 0 refills | Status: DC

## 2022-11-26 MED ORDER — ALUM & MAG HYDROXIDE-SIMETH 200-200-20 MG/5ML PO SUSP
ORAL | Status: AC
  Filled 2022-11-26: qty 30

## 2022-11-26 MED ORDER — LIDOCAINE VISCOUS HCL 2 % MT SOLN
OROMUCOSAL | Status: AC
  Filled 2022-11-26: qty 15

## 2022-11-26 MED ORDER — ALUM & MAG HYDROXIDE-SIMETH 200-200-20 MG/5ML PO SUSP
30.0000 mL | Freq: Once | ORAL | Status: AC
  Administered 2022-11-26: 30 mL via ORAL

## 2022-11-26 MED ORDER — LIDOCAINE VISCOUS HCL 2 % MT SOLN
15.0000 mL | Freq: Once | OROMUCOSAL | Status: AC
  Administered 2022-11-26: 15 mL via OROMUCOSAL

## 2022-11-26 NOTE — ED Provider Notes (Signed)
[facility]    CSN: [id number] Arrival date & time: 11/26/22  1222      History   Chief Complaint No chief complaint on file.   HPI Kerry Ortiz is a 57 y.o. female.   Patient is here for burning in her chest that started last evening.  Her throat felt scratchy, and today her throat and chest are burning.  Some vomiting.  Last night she took tylenol, nyquil, which upset her stomach.  She is on nexium for years.  She was seen 3/30 for similar symptoms.  Given GI cocktail with little help.  Given pepcid to take  bid, which was not helpful.   She is also having sinus congestion since yesterday.  She has been coughing up a bit of blood since yesterday.  Some blood tinged in her vomit.  No diarrhea or constipation.   She had an EGD on 4/9, attempted to call for results but has not been notified.       Past Medical History:  Diagnosis Date   Acid reflux    Asthma    Disturbance of skin sensation    Fibromyalgia    Migraine    Uterine fibroid     Patient Active Problem List   Diagnosis Date Noted   Vision changes 01/16/2021   Chronic migraine w/o aura w/o status migrainosus, not intractable 06/26/2020   Chronic right-sided low back pain with right-sided sciatica 06/26/2020   Atypical chest pain 01/06/2020   Diffuse pain 02/08/2019   Right lumbar radiculopathy 02/08/2019   Upper airway cough syndrome 11/03/2017   Leg weakness 05/07/2017   Hypokalemia 05/07/2017   Anemia 05/07/2017   Vertigo 05/07/2017   Chronic migraine 04/23/2017   Paresthesia 02/07/2016   ABDOMINAL BLOATING 11/08/2008   EPIGASTRIC PAIN 11/08/2008   DEPRESSION 10/31/2008   OSTEOARTHRITIS 10/31/2008   FIBROMYALGIA 10/31/2008   HEADACHE, CHRONIC 10/31/2008   ABDOMINAL PAIN, RECURRENT 10/31/2008   CARPAL TUNNEL SYNDROME, HX OF 10/31/2008    Past Surgical History:  Procedure Laterality Date   CARPAL TUNNEL RELEASE     CYSTECTOMY     HAND SURGERY Right    SHOULDER SURGERY  Right     OB History   No obstetric history on file.      Home Medications    Prior to Admission medications   Medication Sig Start Date End Date Taking? Authorizing Provider  albuterol (PROVENTIL HFA;VENTOLIN HFA) 108 (90 BASE) MCG/ACT inhaler Inhale 2 puffs into the lungs every 6 (six) hours as needed for wheezing or shortness of breath. 10/24/14   Rodolph Bong, MD  amitriptyline (ELAVIL) 25 MG tablet Take 1 tablet (25 mg total) by mouth at bedtime. 12/24/21   Glean Salvo, NP  esomeprazole (NEXIUM) 40 MG capsule TAKE ONE CAPSULE BY MOUTH TWICE DAILY, 30 TO 60 MINUTES BEFORE FIRST AND LAST MEAL OF THE DAY Patient taking differently: Take 40 mg by mouth 2 (two) times daily before a meal. 09/13/19   Nyoka Cowden, MD  famotidine (PEPCID) 20 MG tablet Take 1 tablet (20 mg total) by mouth 2 (two) times daily. Patient not taking: Reported on 11/26/2022 11/01/22   Carlisle Beers, FNP  fluticasone [facility]) 50 MCG/ACT nasal spray Place 2 sprays into both nostrils daily. 02/14/21   Domenick Gong, MD  guaiFENesin-dextromethorphan (ROBITUSSIN DM) 100-10 MG/5ML syrup Take 5 mLs by mouth every 4 (four) hours as needed for cough.    [provider]  hydroxychloroquine (PLAQUENIL) 200 MG tablet Take  400 mg by mouth daily.     [provider]  ibuprofen (ADVIL) 600 MG tablet Take 1 tablet (600 mg total) by mouth every 6 (six) hours as needed. 02/14/21   Domenick Gong, MD  levocetirizine (XYZAL) 5 MG tablet Take 1 tablet (5 mg total) by mouth every evening. 05/08/22   Nyoka Cowden, MD  meclizine (ANTIVERT) 25 MG tablet Take 1 tablet (25 mg total) by mouth 3 (three) times daily as needed for dizziness. 03/23/20   Palumbo, April, MD  montelukast (SINGULAIR) 10 MG tablet Take 10 mg by mouth at bedtime.  04/28/19   [provider]  ondansetron (ZOFRAN ODT) 4 MG disintegrating tablet Take 1 tablet (4 mg total) by mouth every 8 (eight) hours as needed. 12/24/21   Glean Salvo, NP  pregabalin (LYRICA) 200 MG capsule Take 200 mg by mouth 2 (two) times daily.    [provider]  rizatriptan (MAXALT-MLT) 10 MG disintegrating tablet Take 1 tab at onset of migraine.  May repeat in 2 hrs, if needed.  Max dose: 2 tabs/day. This is a 30 day prescription. 12/24/21   Glean Salvo, NP  sucralfate (CARAFATE) 1 G tablet Take 1 tablet (1 g total) by mouth 4 (four) times daily. Patient taking differently: Take 1 g by mouth daily. 07/11/12   Rolan Bucco, MD  SUMAtriptan 6 MG/0.5ML SOAJ Take 1 injection at onset of migraine.  May repeat in 2 hrs, if needed.  Max dose: 2 injections/day. This is a 30 day prescription. 12/24/21   Glean Salvo, NP  tiZANidine (ZANAFLEX) 4 MG tablet Take 4 mg by mouth every 6 (six) hours as needed for muscle spasms.    [provider]  zonisamide (ZONEGRAN) 100 MG capsule Take 1 capsule (100 mg total) by mouth 2 (two) times daily. 12/24/21   Glean Salvo, NP    Family History Family History  Problem Relation Age of Onset   Hypertension Mother    Arrhythmia Mother    Thyroid disease Mother    Pneumonia Father    Diabetes Maternal Grandmother    Kidney disease Sister    Multiple sclerosis Cousin     Social History Social History   Tobacco Use   Smoking status: Never   Smokeless tobacco: Never  Vaping Use   Vaping Use: Never used  Substance Use Topics   Alcohol use: No   Drug use: No     Allergies   Amoxicillin   Review of Systems Review of Systems  Constitutional: Negative.   HENT:  Positive for congestion and sore throat.   Respiratory: Negative.    Cardiovascular: Negative.   Gastrointestinal:  Positive for abdominal pain and vomiting. Negative for constipation and diarrhea.  Musculoskeletal: Negative.   Skin: Negative.   Psychiatric/Behavioral: Negative.       Physical Exam Triage Vital Signs ED Triage Vitals  Enc Vitals Group     BP 11/26/22 1409 125/73     Pulse Rate 11/26/22 1409 73      Resp 11/26/22 1409 18     Temp 11/26/22 1409 98.2 F (36.8 C)     Temp Source 11/26/22 1409 Oral     SpO2 11/26/22 1409 98 %     Weight --      Height --      Head Circumference --      Peak Flow --      Pain Score 11/26/22 1405 10     Pain Loc --  Pain Edu? --      Excl. in [place]? --    No data found.  Updated Vital Signs BP 125/73 (BP Location: Right Arm)   Pulse 73   Temp 98.2 F (36.8 C) (Oral)   Resp 18   LMP  (LMP Unknown) Comment: pt states before 2020  SpO2 98%   Visual Acuity Right Eye Distance:   Left Eye Distance:   Bilateral Distance:    Right Eye Near:   Left Eye Near:    Bilateral Near:     Physical Exam Constitutional:      Appearance: Normal appearance.  Cardiovascular:     Rate and Rhythm: Normal rate and regular rhythm.  Pulmonary:     Effort: Pulmonary effort is normal.     Breath sounds: Normal breath sounds.  Abdominal:     Tenderness: There is abdominal tenderness.     Comments: +TTP to the epigastric area;  no guarding or rebound is noted  Neurological:     Mental Status: She is alert.      UC Treatments / Results  Labs (all labs ordered are listed, but only abnormal results are displayed) Labs Reviewed - No data to display  EKG   Radiology No results found.  Procedures Procedures (including critical care time)  Medications Ordered in UC Medications  alum & mag hydroxide-simeth (MAALOX/MYLANTA) 200-200-20 MG/5ML suspension 30 mL (30 mLs Oral Given 11/26/22 1446)  lidocaine (XYLOCAINE) 2 % viscous mouth solution 15 mL (15 mLs Mouth/Throat Given 11/26/22 1446)    Initial Impression / Assessment and Plan / UC Course  I have reviewed the triage vital signs and the nursing notes.  Pertinent labs & imaging results that were available during my care of the patient were reviewed by me and considered in my medical decision making (see chart for details).   Final Clinical Impressions(s) / UC Diagnoses   Final diagnoses:   Gastroesophageal reflux disease, unspecified whether esophagitis present     Discharge Instructions      You were seen today for worsening GERD symptoms.  We have given you another course of medication here.  I have sent out carafate to take up to four times/day.  Take 1 hr before meals and bedtime.  You may continue the nexium.  Continue to try to call you GI doctor for your endoscopy results.  If you have worsening pain, or start to have bloody emesis, bloody or black tarry stools, then please go to the ER for further evaluation.     ED Prescriptions     Medication Sig Dispense Auth. Provider   sucralfate (CARAFATE) 1 g tablet Take 1 tablet (1 g total) by mouth 4 (four) times daily -  with meals and at bedtime. 40 tablet Jannifer Franklin, MD      PDMP not reviewed this encounter.   Jannifer Franklin, MD 11/26/22 [phone]

## 2022-11-26 NOTE — ED Triage Notes (Addendum)
History of reflux, history of burning in throat.  This burning in chest started last night.  Patient has recurrent episodes.  Patient was seen earlier this month for the same   Last night took tylenol, dayquil, nyquil  4/9, patient had endoscopy done and is still looking for results

## 2022-11-26 NOTE — Discharge Instructions (Signed)
You were seen today for worsening GERD symptoms.  We have given you another course of medication here.  I have sent out carafate to take up to four times/day.  Take 1 hr before meals and bedtime.  You may continue the nexium.  Continue to try to call you GI doctor for your endoscopy results.  If you have worsening pain, or start to have bloody emesis, bloody or black tarry stools, then please go to the ER for further evaluation.

## 2022-12-11 DIAGNOSIS — H04123 Dry eye syndrome of bilateral lacrimal glands: Secondary | ICD-10-CM | POA: Diagnosis not present

## 2022-12-11 DIAGNOSIS — H524 Presbyopia: Secondary | ICD-10-CM | POA: Diagnosis not present

## 2022-12-23 DIAGNOSIS — M25552 Pain in left hip: Secondary | ICD-10-CM | POA: Diagnosis not present

## 2022-12-25 DIAGNOSIS — E7800 Pure hypercholesterolemia, unspecified: Secondary | ICD-10-CM | POA: Diagnosis not present

## 2022-12-25 DIAGNOSIS — R809 Proteinuria, unspecified: Secondary | ICD-10-CM | POA: Diagnosis not present

## 2022-12-25 DIAGNOSIS — R5383 Other fatigue: Secondary | ICD-10-CM | POA: Diagnosis not present

## 2022-12-25 DIAGNOSIS — R7309 Other abnormal glucose: Secondary | ICD-10-CM | POA: Diagnosis not present

## 2022-12-30 ENCOUNTER — Encounter: Payer: Self-pay | Admitting: Neurology

## 2022-12-30 ENCOUNTER — Ambulatory Visit (INDEPENDENT_AMBULATORY_CARE_PROVIDER_SITE_OTHER): Payer: 59 | Admitting: Neurology

## 2022-12-30 VITALS — BP 127/80 | HR 72 | Ht 62.0 in | Wt 137.0 lb

## 2022-12-30 DIAGNOSIS — G43709 Chronic migraine without aura, not intractable, without status migrainosus: Secondary | ICD-10-CM | POA: Diagnosis not present

## 2022-12-30 MED ORDER — SUMATRIPTAN SUCCINATE 6 MG/0.5ML ~~LOC~~ SOAJ: SUBCUTANEOUS | 6 refills | Status: DC

## 2022-12-30 MED ORDER — ZONISAMIDE 100 MG PO CAPS: 100.0000 mg | ORAL_CAPSULE | Freq: Two times a day (BID) | ORAL | 3 refills | Status: DC

## 2022-12-30 MED ORDER — AMITRIPTYLINE HCL 25 MG PO TABS: 25.0000 mg | ORAL_TABLET | Freq: Every day | ORAL | 3 refills | Status: DC

## 2022-12-30 MED ORDER — RIZATRIPTAN BENZOATE 10 MG PO TBDP: ORAL_TABLET | ORAL | 6 refills | Status: DC

## 2022-12-30 NOTE — Progress Notes (Signed)
PATIENT: Kerry Ortiz DOB: [birth year]  Chief Complaint  Patient presents with   Migraine    Rm 14, alone Migraine: 2 in past month    HISTORICAL  [name] is a 57 year old female, seen in refer by her primary care doctor Georgianne Fick for evaluation of chronic migraine, initial evaluation was on April 23 2017.  I reviewed and summarized the referring note, she had past medical history of acid reflux, fibromyalgia, prediabetes, hyperlipidemia, chronic migraine headaches,  She reported history of headaches since age 39, her typical migraine starting from occipital region spreading forward pounding severe headache, associated light noise sensitivity, lasting for a few hours, she has tried over-the-counter Tylenol ibuprofen with limited help, she is now getting more frequent headaches since July 2018 about 2-3 times each week,  [facility], still taking nortriptyline 25 mg 2 tablets every night, zonisamide 25 mg 3 tablets night as preventative medications,  I saw her previously for diffuse body achy pain, paresthesias throughout her body in 2017,   she had a history of fibromyalgia, carpal tunnel release surgery in the past, chronic migraines, she went on disability since 2008, complains of diffuse body achy pain, on polypharmacy treatment, including Elavil, Lyrica, Zonegran, She continue complains of depression symptoms, tearful during today's interview   Since 2012, she began to notice bilateral feet cold burning sensation, involving bottom and top of her feet, gradually trending up to knee level, getting worse with pressure, barium weight, she has chronic low back and neck pain, denies radiating pain, gait difficulty due to pain, no bowel and bladder incontinence, no bilateral upper extremity paresthesia or weakness,   She complains of feeling tired all the time, lack of interest,  laboratory in July 2016, A1c was  elevated 5.9, normal B12 567, TSH 1.4, CMP, she had more laboratory evaluation in August 2016  She is on polypharmacy treatment, taking Lyrica 150 mg twice a day, Celexa 10 mg daily, she still has intermittent bilateral feet paresthesia, no significant pain  EMG nerve conduction study in December 2016, there is no evidence of large fiber peripheral neuropathy   laboratory evaluation in November 2016:normal CBC, mildly low potassium on BMP 3.4  UPDATE Aug 20 2017: She was admitted to the hospital Oct 4 to 01/2017 with acute onset of dizziness, left lower extremity weakness, had extensive evaluations,  MRI of the brain in October 2018, that was normal.  Laboratory evaluation in October 2018, showed mild anemia hemoglobin of 11.3, CMP showed creatinine 0.85, normal TSH, A1c was 5.8, normal lipid profile, LDL was 90, negative ANA, CPK, ESR, HIV, troponin,  Echocardiogram was normal, Ultrasound of carotid artery showed less than 39% stenosis bilaterally.  Since that visit, she has intermittent dizziness, eye are twitching, ears are hurting.  UPDATE February 08 2019: She continue complains of occasionally migraine headaches, well controlled by Maxalt, she is still taking preventive medication Zonegran 100 mg twice a day, Elavil 25 mg at bedtime  She also complains of diffuse body achy pain, the care of rheumatologist, Plaquenil 400 mg daily,  She also complains 4 months history of worsening right-sided low back pain, radiating pain to right lower extremity, sometimes difficulty get out of bed  UPDATE Jun 26 2020: She has headache last for days in Sept 2021, on left side, tightness, ball up into fetal position, [facility], was told it is tension headaches, was given Toradol shot, last for 2  days that helped.  She has tried Maxalt help some.   She start to keep diary and food, her headaches has improves since.   She has not had headache for one month.  She was having headaches once  very 3-4 weeks ,   I personally reviewed CT head wo in August 2021 that was normal. Laboratory evaluation in Sept 2021 showed normal CMP, CBC Hg 13.5, INR 1.0  Update January 16, 2021 SS: Here today alone, reports for 2 weeks, changes to right-sided vision, the right peripheral half of the vision, is dark, like string of hair or smoke. No pain. No double vision, it moves around. Noted to swat around her eye, like bug flying around.  Headaches remain well controlled, on Zonegran, amitriptyline.  On average 1 headache a month.  Takes Maxalt with good benefit.  Has yet to use Imitrex injection, but does need needles and syringes.  On Plaquenil for RA.  Update Dec 24, 2021 SS: Here today alone, having 2 migraines a month, for acute migraine, will do Imitrex injection, with benefit, for milder headaches will use Maxalt. Went to the eye doctor last year for vision change, no problem found, symptoms resolved. Has nagging cough, started Nexium.  Continues on Zonegran 100 mg twice a day, amitriptyline 25 mg at bedtime for migraine prevention.  Happy with her current headache control regimen.  Update Dec 30, 2022 SS: Reports 2 migraines a month, remains on amitriptyline 25 mg at bedtime, Zonegran 100 mg BID. Uses Imitrex injection for severe, has Maxalt for milder. She is pleased with her control. Lately problems with acid reflux, changing her diet. She is on disability.   REVIEW OF SYSTEMS: Full 14 system review of systems performed and notable only for as above  See HPI  ALLERGIES: Allergies  Allergen Reactions   Amoxicillin     Yeast infections    HOME MEDICATIONS: Current Outpatient Medications  Medication Sig Dispense Refill   albuterol (PROVENTIL HFA;VENTOLIN HFA) 108 (90 BASE) MCG/ACT inhaler Inhale 2 puffs into the lungs every 6 (six) hours as needed for wheezing or shortness of breath. 1 Inhaler 2   esomeprazole (NEXIUM) 40 MG capsule TAKE ONE CAPSULE BY MOUTH TWICE DAILY, 30 TO 60 MINUTES  BEFORE FIRST AND LAST MEAL OF THE DAY (Patient taking differently: Take 40 mg by mouth 2 (two) times daily before a meal.) 180 capsule 0   fluticasone (FLONASE) 50 MCG/ACT nasal spray Place 2 sprays into both nostrils daily. 16 g 0   guaiFENesin-dextromethorphan (ROBITUSSIN DM) 100-10 MG/5ML syrup Take 5 mLs by mouth every 4 (four) hours as needed for cough.     hydroxychloroquine (PLAQUENIL) 200 MG tablet Take 400 mg by mouth daily.      ibuprofen (ADVIL) 600 MG tablet Take 1 tablet (600 mg total) by mouth every 6 (six) hours as needed. 30 tablet 0   levocetirizine (XYZAL) 5 MG tablet Take 1 tablet (5 mg total) by mouth every evening. 30 tablet 3   meclizine (ANTIVERT) 25 MG tablet Take 1 tablet (25 mg total) by mouth 3 (three) times daily as needed for dizziness. 12 tablet 0   montelukast (SINGULAIR) 10 MG tablet Take 10 mg by mouth at bedtime.      ondansetron (ZOFRAN ODT) 4 MG disintegrating tablet Take 1 tablet (4 mg total) by mouth every 8 (eight) hours as needed. 20 tablet 2   pregabalin (LYRICA) 200 MG capsule Take 200 mg by mouth 2 (two) times daily.  sucralfate (CARAFATE) 1 g tablet Take 1 tablet (1 g total) by mouth 4 (four) times daily -  with meals and at bedtime. 40 tablet 0   tiZANidine (ZANAFLEX) 4 MG tablet Take 4 mg by mouth every 6 (six) hours as needed for muscle spasms.     amitriptyline (ELAVIL) 25 MG tablet Take 1 tablet (25 mg total) by mouth at bedtime. 90 tablet 3   rizatriptan (MAXALT-MLT) 10 MG disintegrating tablet Take 1 tab at onset of migraine.  May repeat in 2 hrs, if needed.  Max dose: 2 tabs/day. This is a 30 day prescription. 10 tablet 6   SUMAtriptan 6 MG/0.5ML SOAJ Take 1 injection at onset of migraine.  May repeat in 2 hrs, if needed.  Max dose: 2 injections/day. This is a 30 day prescription. 5 mL 6   zonisamide (ZONEGRAN) 100 MG capsule Take 1 capsule (100 mg total) by mouth 2 (two) times daily. 180 capsule 3   No current facility-administered medications  for this visit.    PAST MEDICAL HISTORY: Past Medical History:  Diagnosis Date   Acid reflux    Asthma    Disturbance of skin sensation    Fibromyalgia    Migraine    Uterine fibroid     PAST SURGICAL HISTORY: Past Surgical History:  Procedure Laterality Date   CARPAL TUNNEL RELEASE     CYSTECTOMY     HAND SURGERY Right    SHOULDER SURGERY Right     FAMILY HISTORY: Family History  Problem Relation Age of Onset   Hypertension Mother    Arrhythmia Mother    Thyroid disease Mother    Pneumonia Father    Diabetes Maternal Grandmother    Kidney disease Sister    Multiple sclerosis Cousin     SOCIAL HISTORY:  Social History   Socioeconomic History   Marital status: Single    Spouse name: Not on file   Number of children: 1   Years of education: HS   Highest education level: Not on file  Occupational History   Occupation: Disabled  Tobacco Use   Smoking status: Never   Smokeless tobacco: Never  Vaping Use   Vaping Use: Never used  Substance and Sexual Activity   Alcohol use: No   Drug use: No   Sexual activity: Not Currently    Birth control/protection: None  Other Topics Concern   Not on file  Social History Narrative   Lives at home alone.   Right-handed.   No caffeine use.   Social Determinants of Health   Financial Resource Strain: Not on file  Food Insecurity: Not on file  Transportation Needs: Not on file  Physical Activity: Not on file  Stress: Not on file  Social Connections: Not on file  Intimate Partner Violence: Not on file   PHYSICAL EXAM   Vitals:   12/30/22 1330  BP: 127/80  Pulse: 72  Weight: 137 lb (62.1 kg)  Height: 5\' 2"  (1.575 m)   Not recorded    Body mass index is 25.06 kg/m.  PHYSICAL EXAMNIATION:  Physical Exam  General: The patient is alert and cooperative at the time of the examination.   Skin: No significant peripheral edema is noted.  Neurologic Exam  Mental status: The patient is alert and oriented  x 3 at the time of the examination. The patient has apparent normal recent and remote memory, with an apparently normal attention span and concentration ability.  Cranial nerves: Facial symmetry is present. Speech  is normal, no aphasia or dysarthria is noted. Extraocular movements are full. Visual fields are full.  Motor: The patient has good strength in all 4 extremities.  Sensory examination: Soft touch sensation is symmetric on the face, arms, and legs.  Coordination: The patient has good finger-nose-finger and heel-to-shin bilaterally.  Gait and station: The patient has a normal gait.   Reflexes: Deep tendon reflexes are symmetric.    DIAGNOSTIC DATA (LABS, IMAGING, TESTING) - I reviewed patient records, labs, notes, testing and imaging myself where available.  ASSESSMENT AND PLAN  TESSY FEIDER is a 57 y.o. female   Chronic migraine headache  -Continues to do well about 2 migraines a month, currently pleased with her medication regimen and migraine control -Continue Zonegran 100 mg twice daily, amitriptyline 25 mg at bedtime for migraine prevention -Continue Maxalt as needed for mild to moderate acute headaches, Imitrex injection for severe headache, may combine with Zofran, NSAIDs, tizanidine -Follow-up in 1 year or sooner if needed  Meds ordered this encounter  Medications   zonisamide (ZONEGRAN) 100 MG capsule    Sig: Take 1 capsule (100 mg total) by mouth 2 (two) times daily.    Dispense:  180 capsule    Refill:  3   SUMAtriptan 6 MG/0.5ML SOAJ    Sig: Take 1 injection at onset of migraine.  May repeat in 2 hrs, if needed.  Max dose: 2 injections/day. This is a 30 day prescription.    Dispense:  5 mL    Refill:  6   rizatriptan (MAXALT-MLT) 10 MG disintegrating tablet    Sig: Take 1 tab at onset of migraine.  May repeat in 2 hrs, if needed.  Max dose: 2 tabs/day. This is a 30 day prescription.    Dispense:  10 tablet    Refill:  6   amitriptyline (ELAVIL) 25 MG  tablet    Sig: Take 1 tablet (25 mg total) by mouth at bedtime.    Dispense:  90 tablet    Refill:  3   Otila Kluver, DNP  [facility] [address], [address] [place], [place] [address] [phone]

## 2023-01-01 DIAGNOSIS — Z0000 Encounter for general adult medical examination without abnormal findings: Secondary | ICD-10-CM | POA: Diagnosis not present

## 2023-01-29 DIAGNOSIS — H04123 Dry eye syndrome of bilateral lacrimal glands: Secondary | ICD-10-CM | POA: Diagnosis not present

## 2023-01-29 DIAGNOSIS — H16223 Keratoconjunctivitis sicca, not specified as Sjogren's, bilateral: Secondary | ICD-10-CM | POA: Diagnosis not present

## 2023-02-04 ENCOUNTER — Other Ambulatory Visit: Payer: Self-pay

## 2023-02-04 MED ORDER — RIZATRIPTAN BENZOATE 10 MG PO TBDP: ORAL_TABLET | ORAL | 6 refills | Status: DC

## 2023-02-04 MED ORDER — SUMATRIPTAN SUCCINATE 6 MG/0.5ML ~~LOC~~ SOAJ: SUBCUTANEOUS | 6 refills | Status: DC

## 2023-03-25 DIAGNOSIS — M797 Fibromyalgia: Secondary | ICD-10-CM | POA: Diagnosis not present

## 2023-03-25 DIAGNOSIS — M79643 Pain in unspecified hand: Secondary | ICD-10-CM | POA: Diagnosis not present

## 2023-03-25 DIAGNOSIS — R5383 Other fatigue: Secondary | ICD-10-CM | POA: Diagnosis not present

## 2023-03-25 DIAGNOSIS — M549 Dorsalgia, unspecified: Secondary | ICD-10-CM | POA: Diagnosis not present

## 2023-03-26 DIAGNOSIS — H524 Presbyopia: Secondary | ICD-10-CM | POA: Diagnosis not present

## 2023-03-26 DIAGNOSIS — H04123 Dry eye syndrome of bilateral lacrimal glands: Secondary | ICD-10-CM | POA: Diagnosis not present

## 2023-03-31 ENCOUNTER — Ambulatory Visit (HOSPITAL_COMMUNITY)
Admission: EM | Admit: 2023-03-31 | Discharge: 2023-03-31 | Disposition: A | Discharge status: Home / Self Care | Payer: 59 | Attending: Physician Assistant | Admitting: Physician Assistant

## 2023-03-31 ENCOUNTER — Encounter (HOSPITAL_COMMUNITY): Payer: Self-pay

## 2023-03-31 DIAGNOSIS — K219 Gastro-esophageal reflux disease without esophagitis: Secondary | ICD-10-CM

## 2023-03-31 DIAGNOSIS — R519 Headache, unspecified: Secondary | ICD-10-CM

## 2023-03-31 MED ORDER — LIDOCAINE VISCOUS HCL 2 % MT SOLN
15.0000 mL | Freq: Once | OROMUCOSAL | Status: AC
  Administered 2023-03-31: 15 mL via OROMUCOSAL

## 2023-03-31 MED ORDER — LIDOCAINE VISCOUS HCL 2 % MT SOLN
OROMUCOSAL | Status: AC
  Filled 2023-03-31: qty 15

## 2023-03-31 MED ORDER — ALUM & MAG HYDROXIDE-SIMETH 200-200-20 MG/5ML PO SUSP
ORAL | Status: AC
  Filled 2023-03-31: qty 30

## 2023-03-31 MED ORDER — SUCRALFATE 1 G PO TABS: 1.0000 g | ORAL_TABLET | Freq: Three times a day (TID) | ORAL | 0 refills | Status: AC

## 2023-03-31 MED ORDER — ALUM & MAG HYDROXIDE-SIMETH 200-200-20 MG/5ML PO SUSP
30.0000 mL | Freq: Once | ORAL | Status: AC
  Administered 2023-03-31: 30 mL via ORAL

## 2023-03-31 MED ORDER — KETOROLAC TROMETHAMINE 30 MG/ML IJ SOLN
30.0000 mg | Freq: Once | INTRAMUSCULAR | Status: AC
  Administered 2023-03-31: 30 mg via INTRAMUSCULAR

## 2023-03-31 MED ORDER — KETOROLAC TROMETHAMINE 30 MG/ML IJ SOLN
INTRAMUSCULAR | Status: AC
  Filled 2023-03-31: qty 1

## 2023-03-31 MED ORDER — SUCRALFATE 1 G PO TABS: 1.0000 g | ORAL_TABLET | Freq: Three times a day (TID) | ORAL | 0 refills | Status: DC

## 2023-03-31 NOTE — ED Triage Notes (Signed)
Patient here today with c/o chest pain and headache since Sunday. She describes the pain as sharp and burning on the left side. She has taken 4 ASA 81 mg on Sunday. She has a h/o Migraines and takes Sumatriptan. She has been taking that for the last 2 days with no relief. She also has a h/o acid reflux. She takes Nexium.

## 2023-03-31 NOTE — Discharge Instructions (Signed)
Take Carafate as prescribed.  Follow-up with GI if no improvement. Take sumatriptan as needed for headache.  Follow-up with neurology if no improvement.

## 2023-03-31 NOTE — ED Provider Notes (Signed)
[facility]    CSN: [id number] Arrival date & time: 03/31/23  1646      History   Chief Complaint Chief Complaint  Patient presents with   Chest Pain    HPI Kerry Ortiz is a 57 y.o. female.   Pt complains of epigastric pain that started about two days ago.  She reports she has been seen for this in the past and has been prescribed carafate which has provided relief, she has called her GI doctor for a refill.  She also complains of headaches.  She has a h/o migraines, today's headache feel similar to her previous headache.  She has been taking sumitriptan for the last two days with no relief.  Denies photphobia, nausea, vomitin    Past Medical History:  Diagnosis Date   Acid reflux    Asthma    Disturbance of skin sensation    Fibromyalgia    Migraine    Uterine fibroid     Patient Active Problem List   Diagnosis Date Noted   Vision changes 01/16/2021   Chronic migraine w/o aura w/o status migrainosus, not intractable 06/26/2020   Chronic right-sided low back pain with right-sided sciatica 06/26/2020   Atypical chest pain 01/06/2020   Diffuse pain 02/08/2019   Right lumbar radiculopathy 02/08/2019   Upper airway cough syndrome 11/03/2017   Leg weakness 05/07/2017   Hypokalemia 05/07/2017   Anemia 05/07/2017   Vertigo 05/07/2017   Chronic migraine 04/23/2017   Paresthesia 02/07/2016   ABDOMINAL BLOATING 11/08/2008   EPIGASTRIC PAIN 11/08/2008   DEPRESSION 10/31/2008   OSTEOARTHRITIS 10/31/2008   FIBROMYALGIA 10/31/2008   HEADACHE, CHRONIC 10/31/2008   ABDOMINAL PAIN, RECURRENT 10/31/2008   CARPAL TUNNEL SYNDROME, HX OF 10/31/2008    Past Surgical History:  Procedure Laterality Date   CARPAL TUNNEL RELEASE     CYSTECTOMY     HAND SURGERY Right    SHOULDER SURGERY Right     OB History   No obstetric history on file.      Home Medications    Prior to Admission medications   Medication Sig Start Date End Date Taking? Authorizing  Provider  albuterol (PROVENTIL HFA;VENTOLIN HFA) 108 (90 BASE) MCG/ACT inhaler Inhale 2 puffs into the lungs every 6 (six) hours as needed for wheezing or shortness of breath. 10/24/14  Yes Rodolph Bong, MD  amitriptyline (ELAVIL) 25 MG tablet Take 1 tablet (25 mg total) by mouth at bedtime. 12/30/22  Yes Glean Salvo, NP  esomeprazole (NEXIUM) 40 MG capsule TAKE ONE CAPSULE BY MOUTH TWICE DAILY, 30 TO 60 MINUTES BEFORE FIRST AND LAST MEAL OF THE DAY Patient taking differently: Take 40 mg by mouth 2 (two) times daily before a meal. 09/13/19  Yes Nyoka Cowden, MD  fluticasone (FLONASE) 50 MCG/ACT nasal spray Place 2 sprays into both nostrils daily. 02/14/21  Yes Domenick Gong, MD  hydroxychloroquine (PLAQUENIL) 200 MG tablet Take 400 mg by mouth daily.    Yes [provider]  meclizine (ANTIVERT) 25 MG tablet Take 1 tablet (25 mg total) by mouth 3 (three) times daily as needed for dizziness. 03/23/20  Yes Palumbo, April, MD  montelukast (SINGULAIR) 10 MG tablet Take 10 mg by mouth at bedtime.  04/28/19  Yes [provider]  pregabalin (LYRICA) 200 MG capsule Take 200 mg by mouth 2 (two) times daily.   Yes [provider]  rizatriptan (MAXALT-MLT) 10 MG disintegrating tablet Take 1 tab at onset of migraine.  May repeat  in 2 hrs, if needed.  Max dose: 2 tabs/day. This is a 30 day prescription. 02/04/23  Yes Glean Salvo, NP  SUMAtriptan 6 MG/0.5ML SOAJ Take 1 injection at onset of migraine.  May repeat in 2 hrs, if needed.  Max dose: 2 injections/day. This is a 30 day prescription. 02/04/23  Yes Glean Salvo, NP  tiZANidine (ZANAFLEX) 4 MG tablet Take 4 mg by mouth every 6 (six) hours as needed for muscle spasms.   Yes [provider]  zonisamide (ZONEGRAN) 100 MG capsule Take 1 capsule (100 mg total) by mouth 2 (two) times daily. 12/30/22  Yes Glean Salvo, NP  guaiFENesin-dextromethorphan (ROBITUSSIN DM) 100-10 MG/5ML syrup Take 5 mLs by mouth every 4 (four) hours  as needed for cough.    [provider]  ibuprofen (ADVIL) 600 MG tablet Take 1 tablet (600 mg total) by mouth every 6 (six) hours as needed. 02/14/21   Domenick Gong, MD  levocetirizine (XYZAL) 5 MG tablet Take 1 tablet (5 mg total) by mouth every evening. 05/08/22   Nyoka Cowden, MD  ondansetron (ZOFRAN ODT) 4 MG disintegrating tablet Take 1 tablet (4 mg total) by mouth every 8 (eight) hours as needed. 12/24/21   Glean Salvo, NP  sucralfate (CARAFATE) 1 g tablet Take 1 tablet (1 g total) by mouth 4 (four) times daily -  with meals and at bedtime. 03/31/23   Ward, Tylene Fantasia, PA-C    Family History Family History  Problem Relation Age of Onset   Hypertension Mother    Arrhythmia Mother    Thyroid disease Mother    Pneumonia Father    Diabetes Maternal Grandmother    Kidney disease Sister    Multiple sclerosis Cousin     Social History Social History   Tobacco Use   Smoking status: Never   Smokeless tobacco: Never  Vaping Use   Vaping status: Never Used  Substance Use Topics   Alcohol use: No   Drug use: No     Allergies   Amoxicillin   Review of Systems Review of Systems  Constitutional:  Negative for chills and fever.  HENT:  Negative for ear pain and sore throat.   Eyes:  Negative for pain and visual disturbance.  Respiratory:  Positive for chest tightness. Negative for cough and shortness of breath.   Cardiovascular:  Negative for chest pain and palpitations.  Gastrointestinal:  Negative for abdominal pain and vomiting.  Genitourinary:  Negative for dysuria and hematuria.  Musculoskeletal:  Negative for arthralgias and back pain.  Skin:  Negative for color change and rash.  Neurological:  Positive for headaches. Negative for seizures and syncope.  All other systems reviewed and are negative.    Physical Exam Triage Vital Signs ED Triage Vitals  Encounter Vitals Group     BP 03/31/23 1828 134/75     Systolic BP Percentile --      Diastolic  BP Percentile --      Pulse Rate 03/31/23 1828 74     Resp 03/31/23 1828 16     Temp 03/31/23 1828 98 F (36.7 C)     Temp Source 03/31/23 1828 Oral     SpO2 03/31/23 1828 98 %     Weight 03/31/23 1828 134 lb (60.8 kg)     Height 03/31/23 1828 5\' 2"  (1.575 m)     Head Circumference --      Peak Flow --      Pain Score 03/31/23  1827 6     Pain Loc --      Pain Education --      Exclude from Growth Chart --    No data found.  Updated Vital Signs BP 134/75 (BP Location: Right Arm)   Pulse 74   Temp 98 F (36.7 C) (Oral)   Resp 16   Ht 5\' 2"  (1.575 m)   Wt 134 lb (60.8 kg)   LMP  (LMP Unknown) Comment: pt states before 2020  SpO2 98%   BMI 24.51 kg/m   Visual Acuity Right Eye Distance:   Left Eye Distance:   Bilateral Distance:    Right Eye Near:   Left Eye Near:    Bilateral Near:     Physical Exam Vitals and nursing note reviewed.  Constitutional:      General: She is not in acute distress.    Appearance: She is well-developed.  HENT:     Head: Normocephalic and atraumatic.  Eyes:     Conjunctiva/sclera: Conjunctivae normal.  Cardiovascular:     Rate and Rhythm: Normal rate and regular rhythm.     Heart sounds: No murmur heard. Pulmonary:     Effort: Pulmonary effort is normal. No respiratory distress.     Breath sounds: Normal breath sounds.  Chest:    Abdominal:     Palpations: Abdomen is soft.     Tenderness: There is no abdominal tenderness.  Musculoskeletal:        General: No swelling.     Cervical back: Neck supple.  Skin:    General: Skin is warm and dry.     Capillary Refill: Capillary refill takes less than 2 seconds.  Neurological:     Mental Status: She is alert.  Psychiatric:        Mood and Affect: Mood normal.      UC Treatments / Results  Labs (all labs ordered are listed, but only abnormal results are displayed) Labs Reviewed - No data to display  EKG   Radiology No results found.  Procedures Procedures (including  critical care time)  Medications Ordered in UC Medications  ketorolac (TORADOL) 30 MG/ML injection 30 mg (30 mg Intramuscular Given 03/31/23 1923)  alum & mag hydroxide-simeth (MAALOX/MYLANTA) 200-200-20 MG/5ML suspension 30 mL (30 mLs Oral Given 03/31/23 1923)  lidocaine (XYLOCAINE) 2 % viscous mouth solution 15 mL (15 mLs Mouth/Throat Given 03/31/23 1922)    Initial Impression / Assessment and Plan / UC Course  I have reviewed the triage vital signs and the nursing notes.  Pertinent labs & imaging results that were available during my care of the patient were reviewed by me and considered in my medical decision making (see chart for details).     Patient with history of acid reflux.  She typically takes soaker fate several times a day but has ran out of her medication.  She has relief of chest discomfort with GI cocktail here in clinic today.  EKG with no acute findings.  Will refill soaker fate advised patient to follow-up with GI  Headache similar to her migraines.  Toradol with relief in clinic today.  Advised to continue with sumatriptan as needed.  Advised to follow-up with neurology.  Final Clinical Impressions(s) / UC Diagnoses   Final diagnoses:  None   Discharge Instructions   None    ED Prescriptions     Medication Sig Dispense Auth. Provider   sucralfate (CARAFATE) 1 g tablet  (Status: Discontinued) Take 1 tablet (1 g total)  by mouth 4 (four) times daily -  with meals and at bedtime. 40 tablet Ward, Shanda Bumps Z, PA-C   sucralfate (CARAFATE) 1 g tablet Take 1 tablet (1 g total) by mouth 4 (four) times daily -  with meals and at bedtime. 40 tablet Ward, Tylene Fantasia, PA-C      PDMP not reviewed this encounter.   Ward, Tylene Fantasia, PA-C 03/31/23 [phone]

## 2023-04-12 ENCOUNTER — Emergency Department (HOSPITAL_COMMUNITY)
Admission: EM | Admit: 2023-04-12 | Discharge: 2023-04-13 | Disposition: A | Discharge status: Home / Self Care | Payer: 59 | Attending: Emergency Medicine | Admitting: Emergency Medicine

## 2023-04-12 ENCOUNTER — Other Ambulatory Visit: Payer: Self-pay

## 2023-04-12 ENCOUNTER — Emergency Department (HOSPITAL_COMMUNITY): Payer: 59

## 2023-04-12 DIAGNOSIS — J45909 Unspecified asthma, uncomplicated: Secondary | ICD-10-CM | POA: Insufficient documentation

## 2023-04-12 DIAGNOSIS — E876 Hypokalemia: Secondary | ICD-10-CM | POA: Diagnosis not present

## 2023-04-12 DIAGNOSIS — R0789 Other chest pain: Secondary | ICD-10-CM | POA: Diagnosis not present

## 2023-04-12 DIAGNOSIS — R079 Chest pain, unspecified: Secondary | ICD-10-CM | POA: Insufficient documentation

## 2023-04-12 HISTORY — DX: Other intervertebral disc degeneration, lumbar region without mention of lumbar back pain or lower extremity pain: M51.369

## 2023-04-12 LAB — TROPONIN I (HIGH SENSITIVITY): Troponin I (High Sensitivity): 7 ng/L (ref <18)

## 2023-04-12 LAB — BASIC METABOLIC PANEL
Anion gap: 7 (ref 5–15)
BUN: 16 mg/dL (ref 6–20)
CO2: 22 mmol/L (ref 22–32)
Calcium: 8.9 mg/dL (ref 8.9–10.3)
Chloride: 107 mmol/L (ref 98–111)
Creatinine, Ser: 0.96 mg/dL (ref 0.44–1.00)
GFR, Estimated: >60 mL/min (ref >60)
Glucose, Bld: 117 mg/dL — ABNORMAL HIGH (ref 70–99)
Potassium: 3.3 mmol/L — ABNORMAL LOW (ref 3.5–5.1)
Sodium: 136 mmol/L (ref 135–145)

## 2023-04-12 LAB — CBC
HCT: 38.5 % (ref 36.0–46.0)
Hemoglobin: 12.1 g/dL (ref 12.0–15.0)
MCH: 24.3 pg — ABNORMAL LOW (ref 26.0–34.0)
MCHC: 31.4 g/dL (ref 30.0–36.0)
MCV: 77.3 fL — ABNORMAL LOW (ref 80.0–100.0)
Platelets: 318 10*3/uL (ref 150–400)
RBC: 4.98 MIL/uL (ref 3.87–5.11)
RDW: 13.8 % (ref 11.5–15.5)
WBC: 4.7 10*3/uL (ref 4.0–10.5)
nRBC: 0 % (ref 0.0–0.2)

## 2023-04-12 NOTE — ED Triage Notes (Signed)
Pt BIB [facility] from home c/o CP that radiates into her right arm, pt took a dose of ASA with no relief. EMS gave 2 nitros with no relief.    124/76 102 20 98 161   20g LAC

## 2023-04-12 NOTE — ED Provider Triage Note (Signed)
Emergency Medicine Provider Triage Evaluation Note  Kerry Ortiz , a 57 y.o. female  was evaluated in triage.  Pt complains of chest pain that started yesterday evening.  Pain is on the right side of her chest radiates to her right shoulder.  Intermittent, aching pain.  Patient was given 2 nitros by EMS but reports minimal relief. She denies any fever, shortness of breath, nausea, vomiting.  Denies any history of PE/DVT.  No recent long travel or surgery, denies any leg swelling.  Review of Systems  Positive: As above  Negative: As above  Physical Exam  BP 101/72 (BP Location: Right Arm)   Pulse 78   Temp 98.8 F (37.1 C) (Oral)   Resp 17   LMP  (LMP Unknown) Comment: pt states before 2020  SpO2 100%  Gen:   Awake, no distress   Resp:  Normal effort  MSK:   Moves extremities without difficulty  Other:    Medical Decision Making  Medically screening exam initiated at 7:16 PM.  Appropriate orders placed.  Kerry Ortiz was informed that the remainder of the evaluation will be completed by another provider, this initial triage assessment does not replace that evaluation, and the importance of remaining in the ED until their evaluation is complete.    Kerry Ortiz, [place] 04/12/23 [phone]

## 2023-04-13 ENCOUNTER — Other Ambulatory Visit: Payer: Self-pay

## 2023-04-13 ENCOUNTER — Encounter (HOSPITAL_COMMUNITY): Payer: Self-pay

## 2023-04-13 DIAGNOSIS — R079 Chest pain, unspecified: Secondary | ICD-10-CM | POA: Diagnosis not present

## 2023-04-13 LAB — TROPONIN I (HIGH SENSITIVITY): Troponin I (High Sensitivity): 9 ng/L (ref <18)

## 2023-04-13 MED ORDER — LIDOCAINE VISCOUS HCL 2 % MT SOLN
15.0000 mL | Freq: Once | OROMUCOSAL | Status: AC
  Administered 2023-04-13: 15 mL via ORAL
  Filled 2023-04-13: qty 15

## 2023-04-13 MED ORDER — POTASSIUM CHLORIDE CRYS ER 20 MEQ PO TBCR
40.0000 meq | EXTENDED_RELEASE_TABLET | Freq: Once | ORAL | Status: AC
  Administered 2023-04-13: 40 meq via ORAL
  Filled 2023-04-13: qty 2

## 2023-04-13 MED ORDER — ALUM & MAG HYDROXIDE-SIMETH 200-200-20 MG/5ML PO SUSP
30.0000 mL | Freq: Once | ORAL | Status: AC
  Administered 2023-04-13: 30 mL via ORAL
  Filled 2023-04-13: qty 30

## 2023-04-13 NOTE — ED Provider Notes (Signed)
[place] EMERGENCY DEPARTMENT AT [facility] Provider Note   CSN: [id number] Arrival date & time: 04/12/23  1847     History  Chief Complaint  Patient presents with   Chest Pain    Kerry Ortiz is a 57 y.o. female.  57 year old female with a history of acid reflux, asthma, migraine headaches, fibromyalgia presents to the emergency department for evaluation of chest pain.  She developed pain in the right side of her chest, described as aching, at 2200 on Saturday night.  Symptoms associated with some pain in her right upper extremity as well.  She took 4 tablets of 81 mg aspirin and applied a heating pad to her right arm and went to sleep.  Woke up on Sunday morning with persistent symptoms as well as a frontal headache.  States that these symptoms have been spontaneously improving, though they are still present.  She has not had any jaw pain, back pain, syncope or near syncope, diaphoresis, vomiting, abdominal pain, leg swelling, hemoptysis.  The patient was given 2 nitroglycerin tablets by EMS en route with no change to her symptoms.  She reports similar compalints 2 weeks ago at which time she was assessed at Urgent Care. No known hx of ACS or CAD. No known FHx of ACS.  The history is provided by the patient. No language interpreter was used.  Chest Pain      Home Medications Prior to Admission medications   Medication Sig Start Date End Date Taking? Authorizing Provider  albuterol (PROVENTIL HFA;VENTOLIN HFA) 108 (90 BASE) MCG/ACT inhaler Inhale 2 puffs into the lungs every 6 (six) hours as needed for wheezing or shortness of breath. 10/24/14   Rodolph Bong, MD  amitriptyline (ELAVIL) 25 MG tablet Take 1 tablet (25 mg total) by mouth at bedtime. 12/30/22   Glean Salvo, NP  esomeprazole (NEXIUM) 40 MG capsule TAKE ONE CAPSULE BY MOUTH TWICE DAILY, 30 TO 60 MINUTES BEFORE FIRST AND LAST MEAL OF THE DAY Patient taking differently: Take 40 mg by mouth 2 (two) times daily  before a meal. 09/13/19   Nyoka Cowden, MD  fluticasone (FLONASE) 50 MCG/ACT nasal spray Place 2 sprays into both nostrils daily. 02/14/21   Domenick Gong, MD  guaiFENesin-dextromethorphan (ROBITUSSIN DM) 100-10 MG/5ML syrup Take 5 mLs by mouth every 4 (four) hours as needed for cough.    [provider]  hydroxychloroquine (PLAQUENIL) 200 MG tablet Take 400 mg by mouth daily.     [provider]  ibuprofen (ADVIL) 600 MG tablet Take 1 tablet (600 mg total) by mouth every 6 (six) hours as needed. 02/14/21   Domenick Gong, MD  levocetirizine (XYZAL) 5 MG tablet Take 1 tablet (5 mg total) by mouth every evening. 05/08/22   Nyoka Cowden, MD  meclizine (ANTIVERT) 25 MG tablet Take 1 tablet (25 mg total) by mouth 3 (three) times daily as needed for dizziness. 03/23/20   Palumbo, April, MD  montelukast (SINGULAIR) 10 MG tablet Take 10 mg by mouth at bedtime.  04/28/19   [provider]  ondansetron (ZOFRAN ODT) 4 MG disintegrating tablet Take 1 tablet (4 mg total) by mouth every 8 (eight) hours as needed. 12/24/21   Glean Salvo, NP  pregabalin (LYRICA) 200 MG capsule Take 200 mg by mouth 2 (two) times daily.    [provider]  rizatriptan (MAXALT-MLT) 10 MG disintegrating tablet Take 1 tab at onset of migraine.  May repeat in 2 hrs, if needed.  Max dose: 2 tabs/day. This is a 30 day prescription. 02/04/23   Glean Salvo, NP  sucralfate (CARAFATE) 1 g tablet Take 1 tablet (1 g total) by mouth 4 (four) times daily -  with meals and at bedtime. 03/31/23   Ward, Tylene Fantasia, PA-C  SUMAtriptan 6 MG/0.5ML SOAJ Take 1 injection at onset of migraine.  May repeat in 2 hrs, if needed.  Max dose: 2 injections/day. This is a 30 day prescription. 02/04/23   Glean Salvo, NP  tiZANidine (ZANAFLEX) 4 MG tablet Take 4 mg by mouth every 6 (six) hours as needed for muscle spasms.    [provider]  zonisamide (ZONEGRAN) 100 MG capsule Take 1 capsule (100 mg total) by mouth  2 (two) times daily. 12/30/22   Glean Salvo, NP      Allergies    Amoxicillin    Review of Systems   Review of Systems  Cardiovascular:  Positive for chest pain.  Ten systems reviewed and are negative for acute change, except as noted in the HPI.    Physical Exam Updated Vital Signs BP (!) 155/79 (BP Location: Right Arm)   Pulse 60   Temp 98.8 F (37.1 C)   Resp 16   Ht 5\' 2"  (1.575 m)   Wt 59.9 kg   LMP  (LMP Unknown) Comment: pt states before 2020  SpO2 99%   BMI 24.14 kg/m   Physical Exam Vitals and nursing note reviewed.  Constitutional:      General: She is not in acute distress.    Appearance: She is well-developed. She is not diaphoretic.     Comments: Nontoxic appearing and in NAD  HENT:     Head: Normocephalic and atraumatic.  Eyes:     General: No scleral icterus.    Extraocular Movements: EOM normal.     Conjunctiva/sclera: Conjunctivae normal.  Cardiovascular:     Rate and Rhythm: Normal rate and regular rhythm.     Pulses: Normal pulses.  Pulmonary:     Effort: Pulmonary effort is normal. No respiratory distress.     Breath sounds: No stridor. No wheezing.     Comments: Respirations even and unlabored. Lungs CTAB. No TTP to chest wall. Abdominal:     Palpations: Abdomen is soft.     Tenderness: There is no abdominal tenderness. There is no guarding.     Comments: Soft, nondistended, nontender abdomen.  Musculoskeletal:        General: Normal range of motion.     Cervical back: Normal range of motion.     Comments: No BLE edema. Also no edema to the RUE.  Skin:    General: Skin is warm and dry.     Coloration: Skin is not pale.     Findings: No erythema or rash.  Neurological:     Mental Status: She is alert and oriented to person, place, and time.  Psychiatric:        Mood and Affect: Mood and affect normal.        Behavior: Behavior normal.     ED Results / Procedures / Treatments   Labs (all labs ordered are listed, but only abnormal  results are displayed) Labs Reviewed  BASIC METABOLIC PANEL - Abnormal; Notable for the following components:      Result Value   Potassium 3.3 (*)    Glucose, Bld 117 (*)    All other components within normal limits  CBC - Abnormal; Notable for the following  components:   MCV 77.3 (*)    MCH 24.3 (*)    All other components within normal limits  TROPONIN I (HIGH SENSITIVITY)  TROPONIN I (HIGH SENSITIVITY)    EKG EKG Interpretation Date/Time:  Sunday April 12 2023 18:31:02 EDT Ventricular Rate:  80 PR Interval:  152 QRS Duration:  90 QT Interval:  360 QTC Calculation: 415 R Axis:   31  Text Interpretation: Normal sinus rhythm Normal ECG No significant change since last tracing Confirmed by Zadie Rhine ([id number]) on 04/13/2023 2:52:56 AM  Radiology DG Chest 2 View  Result Date: 04/12/2023 CLINICAL DATA:  Chest pain radiating into the right arm. EXAM: CHEST - 2 VIEW COMPARISON:  PA Lat chest 07/26/2019, high-resolution chest CT 08/13/2021 without contrast. FINDINGS: The heart size and mediastinal contours are within normal limits. Both lungs are clear. The visualized skeletal structures are unremarkable. IMPRESSION: No evidence of acute chest disease or interval changes. Electronically Signed   By: Almira Bar M.D.   On: 04/12/2023 20:08    Procedures Procedures    Medications Ordered in ED Medications  alum & mag hydroxide-simeth (MAALOX/MYLANTA) 200-200-20 MG/5ML suspension 30 mL (30 mLs Oral Given 04/13/23 0314)    And  lidocaine (XYLOCAINE) 2 % viscous mouth solution 15 mL (15 mLs Oral Given 04/13/23 0314)  potassium chloride SA (KLOR-CON M) CR tablet 40 mEq (40 mEq Oral Given 04/13/23 9629)    ED Course/ Medical Decision Making/ A&P                                 Medical Decision Making Risk OTC drugs. Prescription drug management.   This patient presents to the ED for concern of R sided chest pain, this involves an extensive number of treatment options, and  is a complaint that carries with it a high risk of complications and morbidity.  The differential diagnosis includes PNA vs PTX vs PE vs atypical ACS vs MSK vs biliary pathology   Co morbidities that complicate the patient evaluation  GERD Asthma Fibromyalgia   Additional history obtained:  External records from outside source obtained and reviewed including Urgent Care visit on 03/31/23   Lab Tests:  I Ordered, and personally interpreted labs.  The pertinent results include:  K 3.3. Glucose 117. Troponin negative x2.   Imaging Studies ordered:  I ordered imaging studies including CXR  I independently visualized and interpreted imaging which showed no acute cardiopulmonary abnormality I agree with the radiologist interpretation   Cardiac Monitoring:  The patient was maintained on a cardiac monitor.  I personally viewed and interpreted the cardiac monitored which showed an underlying rhythm of: NSR   Medicines ordered and prescription drug management:  I ordered medication including GI cocktail for chest pain  Reevaluation of the patient after these medicines showed that the patient improved I have reviewed the patients home medicines and have made adjustments as needed   Test Considered:  Mg   Problem List / ED Course:  Low suspicion for emergent cardiac etiology given reassuring workup today.  EKG is nonischemic and troponin negative x2.  Symptoms atypical for ACS.  Chest x-ray without evidence of mediastinal widening to suggest dissection.  No pneumothorax, pneumonia, pleural effusion.   Pulmonary embolus further considered; however, patient without tachycardia, tachypnea, dyspnea, hypoxia.  Well's PE score is 0. Has longstanding hx of GERD. Has had some symptomatic improvement with GI cocktail. Question underlying gastritis/PUD. No abdominal TTP  to suggest referred biliary pain. Potential for pain to be related to her known fibromyalgia. This can be followed by PCP  during recheck.   Reevaluation:  After the interventions noted above, I reevaluated the patient and found that they have :improved   Social Determinants of Health:  Lives independently   Dispostion:  After consideration of the diagnostic results and the patients response to treatment, I feel that the patent would benefit from continuation of prescribed medications, close PCP f/u. Return precautions discussed and provided. Patient discharged in stable condition with no unaddressed concerns.          Final Clinical Impression(s) / ED Diagnoses Final diagnoses:  Nonspecific chest pain  Hypokalemia    Rx / DC Orders ED Discharge Orders     None         Antony Madura, PA-C 04/13/23 0550    Zadie Rhine, MD 04/13/23 [phone]

## 2023-04-13 NOTE — Discharge Instructions (Addendum)
Your evaluation in the emergency department this evening was reassuring.  We recommend that you continue your prescribed medications including Carafate that was given at urgent care 1.5 weeks ago.  Schedule an appointment to be reassessed by your primary care doctor.  You may return for new or concerning symptoms.

## 2023-04-24 DIAGNOSIS — Z1231 Encounter for screening mammogram for malignant neoplasm of breast: Secondary | ICD-10-CM | POA: Diagnosis not present

## 2023-05-11 ENCOUNTER — Telehealth: Payer: Self-pay | Admitting: *Deleted

## 2023-05-11 NOTE — Telephone Encounter (Signed)
Transition Care Management Unsuccessful Follow-up Telephone Call  Date of discharge and from where:  [facility]  04/13/2023  Attempts:  1st Attempt  Reason for unsuccessful TCM follow-up call:  No answer/busy

## 2023-06-17 DIAGNOSIS — M549 Dorsalgia, unspecified: Secondary | ICD-10-CM | POA: Diagnosis not present

## 2023-06-17 DIAGNOSIS — Z23 Encounter for immunization: Secondary | ICD-10-CM | POA: Diagnosis not present

## 2023-09-30 DIAGNOSIS — M797 Fibromyalgia: Secondary | ICD-10-CM | POA: Diagnosis not present

## 2023-09-30 DIAGNOSIS — M549 Dorsalgia, unspecified: Secondary | ICD-10-CM | POA: Diagnosis not present

## 2023-09-30 DIAGNOSIS — R768 Other specified abnormal immunological findings in serum: Secondary | ICD-10-CM | POA: Diagnosis not present

## 2023-09-30 DIAGNOSIS — M79643 Pain in unspecified hand: Secondary | ICD-10-CM | POA: Diagnosis not present

## 2023-09-30 DIAGNOSIS — R5383 Other fatigue: Secondary | ICD-10-CM | POA: Diagnosis not present

## 2023-10-26 ENCOUNTER — Ambulatory Visit (HOSPITAL_COMMUNITY)

## 2023-10-30 ENCOUNTER — Other Ambulatory Visit: Payer: Self-pay

## 2023-10-30 ENCOUNTER — Ambulatory Visit
Admission: RE | Admit: 2023-10-30 | Discharge: 2023-10-30 | Disposition: A | Discharge status: Home / Self Care | Source: Ambulatory Visit | Attending: Family Medicine | Admitting: Family Medicine

## 2023-10-30 VITALS — BP 131/84 | HR 67 | Temp 98.1°F | Resp 18

## 2023-10-30 DIAGNOSIS — K625 Hemorrhage of anus and rectum: Secondary | ICD-10-CM | POA: Diagnosis not present

## 2023-10-30 DIAGNOSIS — R0789 Other chest pain: Secondary | ICD-10-CM

## 2023-10-30 DIAGNOSIS — R600 Localized edema: Secondary | ICD-10-CM

## 2023-10-30 MED ORDER — LIDOCAINE VISCOUS HCL 2 % MT SOLN
15.0000 mL | Freq: Once | OROMUCOSAL | Status: AC
  Administered 2023-10-30: 15 mL via OROMUCOSAL

## 2023-10-30 MED ORDER — METOCLOPRAMIDE HCL 5 MG PO TABS: 5.0000 mg | ORAL_TABLET | Freq: Two times a day (BID) | ORAL | 0 refills | Status: DC

## 2023-10-30 MED ORDER — ALUM & MAG HYDROXIDE-SIMETH 200-200-20 MG/5ML PO SUSP
30.0000 mL | Freq: Once | ORAL | Status: AC
  Administered 2023-10-30: 30 mL via ORAL

## 2023-10-30 NOTE — ED Triage Notes (Signed)
 Pt reports rectal bleeding (BRB) 3 days ago. Burning in chest "comes and goes" for over a week. She takes meds for heartburn. Abdominal pain also "sharp pain that comes and goes". Denies n/v, denies SOB.

## 2023-10-30 NOTE — ED Provider Notes (Signed)
 [facility]    CSN: [id number] Arrival date & time: 10/30/23  1409      History   Chief Complaint Chief Complaint  Patient presents with   Abdominal Pain    Blood in stool - Entered by patient   Rectal Bleeding    HPI Kerry Ortiz is a 58 y.o. female.    Abdominal Pain Associated symptoms: hematochezia   Rectal Bleeding Associated symptoms: abdominal pain   Here for burning chest pain that has been coming and going over a week.  She already takes Nexium and Carafate daily.  About 3 days ago she saw blood in her stool, but has not had any since.  She has had possibly loose stools in the last few days.  No cough or congestion or shortness of breath.  No nausea or vomiting.  She is allergic to amoxicillin   Past Medical History:  Diagnosis Date   Acid reflux    Asthma    Degenerative disc disease, lumbar    Disturbance of skin sensation    Fibromyalgia    Migraine    Uterine fibroid     Patient Active Problem List   Diagnosis Date Noted   Throat irritation 03/26/2021   Vision changes 01/16/2021   Chronic migraine w/o aura w/o status migrainosus, not intractable 06/26/2020   Chronic right-sided low back pain with right-sided sciatica 06/26/2020   Right ear impacted cerumen 05/24/2020   Atypical chest pain 01/06/2020   Pes anserinus bursitis of left knee 05/03/2019   Chondromalacia of left patella 05/03/2019   Diffuse pain 02/08/2019   Right lumbar radiculopathy 02/08/2019   Chronic cough 02/25/2018   Upper airway cough syndrome 11/03/2017   Temporomandibular joint (TMJ) pain 09/02/2017   Referred otalgia of right ear 09/02/2017   Leg weakness 05/07/2017   Hypokalemia 05/07/2017   Anemia 05/07/2017   Vertigo 05/07/2017   Chronic migraine 04/23/2017   Paresthesia 02/07/2016   ABDOMINAL BLOATING 11/08/2008   EPIGASTRIC PAIN 11/08/2008   DEPRESSION 10/31/2008   OSTEOARTHRITIS 10/31/2008   FIBROMYALGIA 10/31/2008   HEADACHE, CHRONIC  10/31/2008   ABDOMINAL PAIN, RECURRENT 10/31/2008   CARPAL TUNNEL SYNDROME, HX OF 10/31/2008    Past Surgical History:  Procedure Laterality Date   CARPAL TUNNEL RELEASE     CYSTECTOMY     HAND SURGERY Right    SHOULDER SURGERY Right     OB History   No obstetric history on file.      Home Medications    Prior to Admission medications   Medication Sig Start Date End Date Taking? Authorizing Provider  citalopram (CELEXA) 10 MG tablet Take 10 mg by mouth daily. 08/10/23  Yes [provider]  diclofenac (VOLTAREN) 75 MG EC tablet Take 75 mg by mouth 2 (two) times daily. 08/31/23  Yes [provider]  metoCLOPramide (REGLAN) 5 MG tablet Take 1 tablet (5 mg total) by mouth in the morning and at bedtime. 10/30/23  Yes Zenia Resides, MD  albuterol (PROVENTIL HFA;VENTOLIN HFA) 108 (90 BASE) MCG/ACT inhaler Inhale 2 puffs into the lungs every 6 (six) hours as needed for wheezing or shortness of breath. 10/24/14   Rodolph Bong, MD  amitriptyline (ELAVIL) 25 MG tablet Take 1 tablet (25 mg total) by mouth at bedtime. 12/30/22   Glean Salvo, NP  esomeprazole (NEXIUM) 40 MG capsule TAKE ONE CAPSULE BY MOUTH TWICE DAILY, 30 TO 60 MINUTES BEFORE FIRST AND LAST MEAL OF THE DAY Patient taking differently: Take 40  mg by mouth 2 (two) times daily before a meal. 09/13/19   Nyoka Cowden, MD  fluticasone (FLONASE) 50 MCG/ACT nasal spray Place 2 sprays into both nostrils daily. 02/14/21   Domenick Gong, MD  guaiFENesin-dextromethorphan (ROBITUSSIN DM) 100-10 MG/5ML syrup Take 5 mLs by mouth every 4 (four) hours as needed for cough.    [provider]  hydroxychloroquine (PLAQUENIL) 200 MG tablet Take 400 mg by mouth daily.     [provider]  levocetirizine (XYZAL) 5 MG tablet Take 1 tablet (5 mg total) by mouth every evening. 05/08/22   Nyoka Cowden, MD  meclizine (ANTIVERT) 25 MG tablet Take 1 tablet (25 mg total) by mouth 3 (three) times daily as needed for  dizziness. 03/23/20   Palumbo, April, MD  montelukast (SINGULAIR) 10 MG tablet Take 10 mg by mouth at bedtime.  04/28/19   [provider]  ondansetron (ZOFRAN ODT) 4 MG disintegrating tablet Take 1 tablet (4 mg total) by mouth every 8 (eight) hours as needed. 12/24/21   Glean Salvo, NP  pregabalin (LYRICA) 200 MG capsule Take 200 mg by mouth 2 (two) times daily.    [provider]  rizatriptan (MAXALT-MLT) 10 MG disintegrating tablet Take 1 tab at onset of migraine.  May repeat in 2 hrs, if needed.  Max dose: 2 tabs/day. This is a 30 day prescription. 02/04/23   Glean Salvo, NP  sucralfate (CARAFATE) 1 g tablet Take 1 tablet (1 g total) by mouth 4 (four) times daily -  with meals and at bedtime. 03/31/23   Ward, Tylene Fantasia, PA-C  SUMAtriptan 6 MG/0.5ML SOAJ Take 1 injection at onset of migraine.  May repeat in 2 hrs, if needed.  Max dose: 2 injections/day. This is a 30 day prescription. 02/04/23   Glean Salvo, NP  tiZANidine (ZANAFLEX) 4 MG tablet Take 4 mg by mouth every 6 (six) hours as needed for muscle spasms.    [provider]  zonisamide (ZONEGRAN) 100 MG capsule Take 1 capsule (100 mg total) by mouth 2 (two) times daily. 12/30/22   Glean Salvo, NP    Family History Family History  Problem Relation Age of Onset   Hypertension Mother    Arrhythmia Mother    Thyroid disease Mother    Pneumonia Father    Diabetes Maternal Grandmother    Kidney disease Sister    Multiple sclerosis Cousin     Social History Social History   Tobacco Use   Smoking status: Never   Smokeless tobacco: Never  Vaping Use   Vaping status: Never Used  Substance Use Topics   Alcohol use: No   Drug use: No     Allergies   Amoxicillin   Review of Systems Review of Systems  Gastrointestinal:  Positive for abdominal pain and hematochezia.     Physical Exam Triage Vital Signs ED Triage Vitals  Encounter Vitals Group     BP 10/30/23 1439 131/84     Systolic BP  Percentile --      Diastolic BP Percentile --      Pulse Rate 10/30/23 1439 67     Resp 10/30/23 1439 18     Temp 10/30/23 1439 98.1 F (36.7 C)     Temp Source 10/30/23 1439 Oral     SpO2 10/30/23 1439 97 %     Weight --      Height --      Head Circumference --  Peak Flow --      Pain Score 10/30/23 1437 8     Pain Loc --      Pain Education --      Exclude from Growth Chart --    No data found.  Updated Vital Signs BP 131/84 (BP Location: Right Arm)   Pulse 67   Temp 98.1 F (36.7 C) (Oral)   Resp 18   LMP  (LMP Unknown) Comment: pt states before 2020  SpO2 97%   Visual Acuity Right Eye Distance:   Left Eye Distance:   Bilateral Distance:    Right Eye Near:   Left Eye Near:    Bilateral Near:     Physical Exam Vitals reviewed.  Constitutional:      General: She is not in acute distress.    Appearance: She is not ill-appearing, toxic-appearing or diaphoretic.  HENT:     Mouth/Throat:     Mouth: Mucous membranes are moist.  Eyes:     Extraocular Movements: Extraocular movements intact.     Conjunctiva/sclera: Conjunctivae normal.     Pupils: Pupils are equal, round, and reactive to light.  Cardiovascular:     Rate and Rhythm: Normal rate and regular rhythm.     Heart sounds: No murmur heard. Pulmonary:     Effort: Pulmonary effort is normal. No respiratory distress.     Breath sounds: Normal breath sounds. No stridor. No wheezing, rhonchi or rales.  Chest:     Chest wall: No tenderness.  Abdominal:     General: There is no distension.     Palpations: Abdomen is soft.     Tenderness: There is no abdominal tenderness. There is no guarding.  Musculoskeletal:     Cervical back: Neck supple.     Comments: There is trace edema of both ankles.  No erythema and negative Homans.  Lymphadenopathy:     Cervical: No cervical adenopathy.  Skin:    Coloration: Skin is not jaundiced or pale.  Neurological:     General: No focal deficit present.     Mental  Status: She is alert and oriented to person, place, and time.  Psychiatric:        Behavior: Behavior normal.      UC Treatments / Results  Labs (all labs ordered are listed, but only abnormal results are displayed) Labs Reviewed  CBC  COMPREHENSIVE METABOLIC PANEL WITH GFR    EKG   Radiology No results found.  Procedures Procedures (including critical care time)  Medications Ordered in UC Medications  alum & mag hydroxide-simeth (MAALOX/MYLANTA) 200-200-20 MG/5ML suspension 30 mL (30 mLs Oral Given 10/30/23 1542)  lidocaine (XYLOCAINE) 2 % viscous mouth solution 15 mL (15 mLs Mouth/Throat Given 10/30/23 1542)    Initial Impression / Assessment and Plan / UC Course  I have reviewed the triage vital signs and the nursing notes.  Pertinent labs & imaging results that were available during my care of the patient were reviewed by me and considered in my medical decision making (see chart for details).     EKG shows normal sinus rhythm without any worrisome ST segment changes.  GI cocktail really did not provide her any relief.  We discussed whether or not she needed to go to the emergency room today and she feels that she does not have urgent symptoms to warrant that.  She will be seeing her primary care on 1 April.  CBC and CMP are drawn to evaluate the pedal edema and rectal  bleeding.  We will notify her of anything significantly abnormal.  Reglan is sent and to try to see if it will give her any relief for the burning in her chest.  She will continue her Nexium and Carafate the same. Final Clinical Impressions(s) / UC Diagnoses   Final diagnoses:  Pedal edema  Rectal bleeding  Atypical chest pain     Discharge Instructions      The EKG was normal  We gave you lidocaine and Maalox in the GI cocktail.  We have drawn blood to check your blood counts and electrolytes and kidney and liver function numbers.  Staff will call you if anything is significantly  abnormal.  Please feel free to call our clinic if you have any questions about your results on MyChart  Start taking metoclopramide 5 mg--1 tablet twice daily to see if that will help reflux.  Keep taking your Nexium and sucralfate.     ED Prescriptions     Medication Sig Dispense Auth. Provider   metoCLOPramide (REGLAN) 5 MG tablet Take 1 tablet (5 mg total) by mouth in the morning and at bedtime. 30 tablet Koren Plyler, Janace Aris, MD      PDMP not reviewed this encounter.   Zenia Resides, MD 10/30/23 [phone]

## 2023-10-30 NOTE — Discharge Instructions (Signed)
 The EKG was normal  We gave you lidocaine and Maalox in the GI cocktail.  We have drawn blood to check your blood counts and electrolytes and kidney and liver function numbers.  Staff will call you if anything is significantly abnormal.  Please feel free to call our clinic if you have any questions about your results on MyChart  Start taking metoclopramide 5 mg--1 tablet twice daily to see if that will help reflux.  Keep taking your Nexium and sucralfate.

## 2023-10-31 ENCOUNTER — Telehealth: Payer: Self-pay | Admitting: *Deleted

## 2023-10-31 LAB — COMPREHENSIVE METABOLIC PANEL WITH GFR
ALT: 20 IU/L (ref 0–32)
AST: 21 IU/L (ref 0–40)
Albumin: 4.5 g/dL (ref 3.8–4.9)
Alkaline Phosphatase: 54 IU/L (ref 44–121)
BUN/Creatinine Ratio: 17 (ref 9–23)
BUN: 13 mg/dL (ref 6–24)
Bilirubin Total: 0.3 mg/dL (ref 0.0–1.2)
CO2: 20 mmol/L (ref 20–29)
Calcium: 9.6 mg/dL (ref 8.7–10.2)
Chloride: 105 mmol/L (ref 96–106)
Creatinine, Ser: 0.77 mg/dL (ref 0.57–1.00)
Globulin, Total: 2.4 g/dL (ref 1.5–4.5)
Glucose: 90 mg/dL (ref 70–99)
Potassium: 4 mmol/L (ref 3.5–5.2)
Sodium: 139 mmol/L (ref 134–144)
Total Protein: 6.9 g/dL (ref 6.0–8.5)
eGFR: 89 mL/min/{1.73_m2} (ref >59)

## 2023-10-31 LAB — CBC
Hematocrit: 38.9 % (ref 34.0–46.6)
Hemoglobin: 11.8 g/dL (ref 11.1–15.9)
MCH: 24.2 pg — ABNORMAL LOW (ref 26.6–33.0)
MCHC: 30.3 g/dL — ABNORMAL LOW (ref 31.5–35.7)
MCV: 80 fL (ref 79–97)
Platelets: 316 10*3/uL (ref 150–450)
RBC: 4.87 x10E6/uL (ref 3.77–5.28)
RDW: 14.4 % (ref 11.7–15.4)
WBC: 5 10*3/uL (ref 3.4–10.8)

## 2023-10-31 NOTE — Telephone Encounter (Signed)
 Returned msg from pt; pt inquiring about her blood work results from yesterday. Informed pt that results do not show any significant abnormalities, and explained that her H&H were WNL. Pt verbalized understanding.

## 2023-11-03 DIAGNOSIS — K921 Melena: Secondary | ICD-10-CM | POA: Diagnosis not present

## 2023-11-03 DIAGNOSIS — Z791 Long term (current) use of non-steroidal anti-inflammatories (NSAID): Secondary | ICD-10-CM | POA: Diagnosis not present

## 2023-12-01 DIAGNOSIS — R109 Unspecified abdominal pain: Secondary | ICD-10-CM | POA: Diagnosis not present

## 2023-12-01 DIAGNOSIS — K625 Hemorrhage of anus and rectum: Secondary | ICD-10-CM | POA: Diagnosis not present

## 2023-12-10 DIAGNOSIS — K921 Melena: Secondary | ICD-10-CM | POA: Diagnosis not present

## 2023-12-10 DIAGNOSIS — K625 Hemorrhage of anus and rectum: Secondary | ICD-10-CM | POA: Diagnosis not present

## 2023-12-15 DIAGNOSIS — N644 Mastodynia: Secondary | ICD-10-CM | POA: Diagnosis not present

## 2023-12-22 ENCOUNTER — Encounter: Payer: Self-pay | Admitting: Family Medicine

## 2023-12-22 DIAGNOSIS — N644 Mastodynia: Secondary | ICD-10-CM

## 2023-12-29 NOTE — Progress Notes (Unsigned)
 PATIENT: Kerry Ortiz DOB: 1966/06/20  Chief Complaint  Patient presents with   Migraine    Rm16, alone, MIGRAINE:2/30 days, doing well. No concern. Triggers: food, stress, sodium intake, lights, sleep deprivation   HISTORICAL  Kerry Ortiz is a 58 year old female, seen in refer by her primary care doctor Kerry Ortiz for evaluation of chronic migraine, initial evaluation was on April 23 2017.  I reviewed and summarized the referring note, she had past medical history of acid reflux, fibromyalgia, prediabetes, hyperlipidemia, chronic migraine headaches,  She reported history of headaches since age 19, her typical migraine starting from occipital region spreading forward pounding severe headache, associated light noise sensitivity, lasting for a few hours, she has tried over-the-counter Tylenol  ibuprofen  with limited help, she is now getting more frequent headaches since July 2018 about 2-3 times each week,  [facility], still taking nortriptyline 25 mg 2 tablets every night, zonisamide  25 mg 3 tablets night as preventative medications,  I saw her previously for diffuse body achy pain, paresthesias throughout her body in 2017,   she had a history of fibromyalgia, carpal tunnel release surgery in the past, chronic migraines, she went on disability since 2008, complains of diffuse body achy pain, on polypharmacy treatment, including Elavil , Lyrica , Zonegran , She continue complains of depression symptoms, tearful during today's interview   Since 2012, she began to notice bilateral feet cold burning sensation, involving bottom and top of her feet, gradually trending up to knee level, getting worse with pressure, barium weight, she has chronic low back and neck pain, denies radiating pain, gait difficulty due to pain, no bowel and bladder incontinence, no bilateral upper extremity paresthesia or weakness,   She complains of feeling  tired all the time, lack of interest,  laboratory in July 2016, A1c was elevated 5.9, normal B12 567, TSH 1.4, CMP, she had more laboratory evaluation in August 2016  She is on polypharmacy treatment, taking Lyrica  150 mg twice a day, Celexa  10 mg daily, she still has intermittent bilateral feet paresthesia, no significant pain  EMG nerve conduction study in December 2016, there is no evidence of large fiber peripheral neuropathy   laboratory evaluation in November 2016:normal CBC, mildly low potassium on BMP 3.4  UPDATE Aug 20 2017: She was admitted to the hospital Oct 4 to 01/2017 with acute onset of dizziness, left lower extremity weakness, had extensive evaluations,  MRI of the brain in October 2018, that was normal.  Laboratory evaluation in October 2018, showed mild anemia hemoglobin of 11.3, CMP showed creatinine 0.85, normal TSH, A1c was 5.8, normal lipid profile, LDL was 90, negative ANA, CPK, ESR, HIV, troponin,  Echocardiogram was normal, Ultrasound of carotid artery showed less than 39% stenosis bilaterally.  Since that visit, she has intermittent dizziness, eye are twitching, ears are hurting.  UPDATE February 08 2019: She continue complains of occasionally migraine headaches, well controlled by Maxalt , she is still taking preventive medication Zonegran  100 mg twice a day, Elavil  25 mg at bedtime  She also complains of diffuse body achy pain, the care of rheumatologist, Plaquenil 400 mg daily,  She also complains 4 months history of worsening right-sided low back pain, radiating pain to right lower extremity, sometimes difficulty get out of bed  UPDATE Jun 26 2020: She has headache last for days in Sept 2021, on left side, tightness, ball up into fetal position, [facility], was told it is tension headaches, was  given Toradol  shot, last for 2 days that helped.  She has tried Maxalt  help some.   She start to keep diary and food, her headaches has improves since.    She has not had headache for one month.  She was having headaches once very 3-4 weeks ,   I personally reviewed CT head wo in August 2021 that was normal. Laboratory evaluation in Sept 2021 showed normal CMP, CBC Hg 13.5, INR 1.0  Update January 16, 2021 SS: Here today alone, reports for 2 weeks, changes to right-sided vision, the right peripheral half of the vision, is dark, like string of hair or smoke. No pain. No double vision, it moves around. Noted to swat around her eye, like bug flying around.  Headaches remain well controlled, on Zonegran , amitriptyline .  On average 1 headache a month.  Takes Maxalt  with good benefit.  Has yet to use Imitrex  injection, but does need needles and syringes.  On Plaquenil for RA.  Update Dec 24, 2021 SS: Here today alone, having 2 migraines a month, for acute migraine, will do Imitrex  injection, with benefit, for milder headaches will use Maxalt . Went to the eye doctor last year for vision change, no problem found, symptoms resolved. Has nagging cough, started Nexium .  Continues on Zonegran  100 mg twice a day, amitriptyline  25 mg at bedtime for migraine prevention.  Happy with her current headache control regimen.  Update Dec 30, 2022 SS: Reports 2 migraines a month, remains on amitriptyline  25 mg at bedtime, Zonegran  100 mg BID. Uses Imitrex  injection for severe, has Maxalt  for milder. She is pleased with her control. Lately problems with acid reflux, changing her diet. She is on disability.   Update Dec 30, 2023 SS: Doing well, 2 migraine a month. Remains on Zonegran  100 mg BID, amitriptyline  25 mg at bedtime. Takes Maxalt  PRN, usually twice a month, only rarely needs the Imitrex  injection, not even once a month. Has fibromyalgia, hurts more, wishes she could do more. CBC, CMP in March 2025.  REVIEW OF SYSTEMS: Full 14 system review of systems performed and notable only for as above  See HPI  ALLERGIES: Allergies  Allergen Reactions   Amoxicillin       Yeast infections    HOME MEDICATIONS: Current Outpatient Medications  Medication Sig Dispense Refill   albuterol  (PROVENTIL  HFA;VENTOLIN  HFA) 108 (90 BASE) MCG/ACT inhaler Inhale 2 puffs into the lungs every 6 (six) hours as needed for wheezing or shortness of breath. 1 Inhaler 2   citalopram  (CELEXA ) 10 MG tablet Take 10 mg by mouth daily.     diclofenac  (VOLTAREN ) 75 MG EC tablet Take 75 mg by mouth 2 (two) times daily.     esomeprazole  (NEXIUM ) 40 MG capsule TAKE ONE CAPSULE BY MOUTH TWICE DAILY, 30 TO 60 MINUTES BEFORE FIRST AND LAST MEAL OF THE DAY (Patient taking differently: Take 40 mg by mouth 2 (two) times daily before a meal.) 180 capsule 0   fluticasone  (FLONASE ) 50 MCG/ACT nasal spray Place 2 sprays into both nostrils daily. 16 g 0   guaiFENesin -dextromethorphan (ROBITUSSIN DM) 100-10 MG/5ML syrup Take 5 mLs by mouth every 4 (four) hours as needed for cough.     hydroxychloroquine (PLAQUENIL) 200 MG tablet Take 400 mg by mouth daily.      levocetirizine (XYZAL ) 5 MG tablet Take 1 tablet (5 mg total) by mouth every evening. 30 tablet 3   meclizine  (ANTIVERT ) 25 MG tablet Take 1 tablet (25 mg total) by mouth 3 (three) times  daily as needed for dizziness. 12 tablet 0   metoCLOPramide  (REGLAN ) 5 MG tablet Take 1 tablet (5 mg total) by mouth in the morning and at bedtime. 30 tablet 0   montelukast (SINGULAIR) 10 MG tablet Take 10 mg by mouth at bedtime.      pregabalin  (LYRICA ) 200 MG capsule Take 200 mg by mouth 2 (two) times daily.     sucralfate  (CARAFATE ) 1 g tablet Take 1 tablet (1 g total) by mouth 4 (four) times daily -  with meals and at bedtime. 40 tablet 0   tiZANidine  (ZANAFLEX ) 4 MG tablet Take 4 mg by mouth every 6 (six) hours as needed for muscle spasms.     amitriptyline  (ELAVIL ) 25 MG tablet Take 1 tablet (25 mg total) by mouth at bedtime. 90 tablet 3   ondansetron  (ZOFRAN  ODT) 4 MG disintegrating tablet Take 1 tablet (4 mg total) by mouth every 8 (eight) hours as needed.  20 tablet 2   rizatriptan  (MAXALT -MLT) 10 MG disintegrating tablet Take 1 tab at onset of migraine.  May repeat in 2 hrs, if needed.  Max dose: 2 tabs/day. This is a 30 day prescription. 10 tablet 6   SUMAtriptan  6 MG/0.5ML SOAJ Take 1 injection at onset of migraine.  May repeat in 2 hrs, if needed.  Max dose: 2 injections/day. This is a 30 day prescription. 5 mL 6   zonisamide  (ZONEGRAN ) 100 MG capsule Take 1 capsule (100 mg total) by mouth 2 (two) times daily. 180 capsule 3   No current facility-administered medications for this visit.    PAST MEDICAL HISTORY: Past Medical History:  Diagnosis Date   Acid reflux    Asthma    Degenerative disc disease, lumbar    Disturbance of skin sensation    Fibromyalgia    Migraine    Uterine fibroid     PAST SURGICAL HISTORY: Past Surgical History:  Procedure Laterality Date   CARPAL TUNNEL RELEASE     CYSTECTOMY     HAND SURGERY Right    SHOULDER SURGERY Right     FAMILY HISTORY: Family History  Problem Relation Age of Onset   Hypertension Mother    Arrhythmia Mother    Thyroid  disease Mother    Pneumonia Father    Diabetes Maternal Grandmother    Kidney disease Sister    Multiple sclerosis Cousin     SOCIAL HISTORY:  Social History   Socioeconomic History   Marital status: Single    Spouse name: Not on file   Number of children: 1   Years of education: HS   Highest education level: Not on file  Occupational History   Occupation: Disabled  Tobacco Use   Smoking status: Never   Smokeless tobacco: Never  Vaping Use   Vaping status: Never Used  Substance and Sexual Activity   Alcohol use: No   Drug use: No   Sexual activity: Not Currently    Birth control/protection: None  Other Topics Concern   Not on file  Social History Narrative   Lives at home alone.   Right-handed.   No caffeine use.   Social Drivers of Corporate investment banker Strain: Not on file  Food Insecurity: Not on file  Transportation  Needs: Not on file  Physical Activity: Not on file  Stress: Not on file  Social Connections: Not on file  Intimate Partner Violence: Not on file   PHYSICAL EXAM   Vitals:   12/30/23 1331  BP: 134/76  Pulse: (!) 56  Weight: 131 lb 9.6 oz (59.7 kg)  Height: 5\' 2"  (1.575 m)    Not recorded    Body mass index is 24.07 kg/m.  PHYSICAL EXAMNIATION:  Physical Exam  General: The patient is alert and cooperative at the time of the examination.   Skin: No significant peripheral edema is noted.  Neurologic Exam  Mental status: The patient is alert and oriented x 3 at the time of the examination. The patient has apparent normal recent and remote memory, with an apparently normal attention span and concentration ability.  Cranial nerves: Facial symmetry is present. Speech is normal, no aphasia or dysarthria is noted. Extraocular movements are full. Visual fields are full.  Motor: The patient has good strength in all 4 extremities.  Sensory examination: Soft touch sensation is symmetric on the face, arms, and legs.  Coordination: The patient has good finger-nose-finger and heel-to-shin bilaterally.  Gait and station: The patient has a normal gait.   Reflexes: Deep tendon reflexes are symmetric.  DIAGNOSTIC DATA (LABS, IMAGING, TESTING) - I reviewed patient records, labs, notes, testing and imaging myself where available.  ASSESSMENT AND PLAN  DAUN RENS is a 58 y.o. female   Chronic migraine headache  - Stable, about 2 migraines a month, pleased with her current migraine control -Continue Zonegran  100 mg twice daily, amitriptyline  25 mg at bedtime for migraine prevention -Continue Maxalt  as needed for mild to moderate acute headaches, Imitrex  injection for severe headache, may combine with Zofran , NSAIDs, tizanidine  - Follow-up with primary care for refills going forward, return here for new or worsening symptoms  Meds ordered this encounter  Medications   zonisamide   (ZONEGRAN ) 100 MG capsule    Sig: Take 1 capsule (100 mg total) by mouth 2 (two) times daily.    Dispense:  180 capsule    Refill:  3   rizatriptan  (MAXALT -MLT) 10 MG disintegrating tablet    Sig: Take 1 tab at onset of migraine.  May repeat in 2 hrs, if needed.  Max dose: 2 tabs/day. This is a 30 day prescription.    Dispense:  10 tablet    Refill:  6   ondansetron  (ZOFRAN  ODT) 4 MG disintegrating tablet    Sig: Take 1 tablet (4 mg total) by mouth every 8 (eight) hours as needed.    Dispense:  20 tablet    Refill:  2   amitriptyline  (ELAVIL ) 25 MG tablet    Sig: Take 1 tablet (25 mg total) by mouth at bedtime.    Dispense:  90 tablet    Refill:  3   SUMAtriptan  6 MG/0.5ML SOAJ    Sig: Take 1 injection at onset of migraine.  May repeat in 2 hrs, if needed.  Max dose: 2 injections/day. This is a 30 day prescription.    Dispense:  5 mL    Refill:  6   Cortland Ding, [facility]  [facility] [address] [place], [place] [address] [phone]

## 2023-12-30 ENCOUNTER — Encounter: Payer: Self-pay | Admitting: Neurology

## 2023-12-30 ENCOUNTER — Ambulatory Visit (INDEPENDENT_AMBULATORY_CARE_PROVIDER_SITE_OTHER): Payer: 59 | Admitting: Neurology

## 2023-12-30 ENCOUNTER — Telehealth: Payer: Self-pay | Admitting: Neurology

## 2023-12-30 VITALS — BP 134/76 | HR 56 | Ht 62.0 in | Wt 131.6 lb

## 2023-12-30 DIAGNOSIS — E7800 Pure hypercholesterolemia, unspecified: Secondary | ICD-10-CM | POA: Diagnosis not present

## 2023-12-30 DIAGNOSIS — R7309 Other abnormal glucose: Secondary | ICD-10-CM | POA: Diagnosis not present

## 2023-12-30 DIAGNOSIS — Z0000 Encounter for general adult medical examination without abnormal findings: Secondary | ICD-10-CM | POA: Diagnosis not present

## 2023-12-30 DIAGNOSIS — G43709 Chronic migraine without aura, not intractable, without status migrainosus: Secondary | ICD-10-CM | POA: Diagnosis not present

## 2023-12-30 DIAGNOSIS — E785 Hyperlipidemia, unspecified: Secondary | ICD-10-CM | POA: Diagnosis not present

## 2023-12-30 MED ORDER — AMITRIPTYLINE HCL 25 MG PO TABS: 25.0000 mg | ORAL_TABLET | Freq: Every day | ORAL | 3 refills | Status: AC

## 2023-12-30 MED ORDER — ONDANSETRON 4 MG PO TBDP: 4.0000 mg | ORAL_TABLET | Freq: Three times a day (TID) | ORAL | 2 refills | Status: AC | PRN

## 2023-12-30 MED ORDER — SUMATRIPTAN SUCCINATE 6 MG/0.5ML ~~LOC~~ SOAJ: SUBCUTANEOUS | 6 refills | Status: AC

## 2023-12-30 MED ORDER — ZONISAMIDE 100 MG PO CAPS: 100.0000 mg | ORAL_CAPSULE | Freq: Two times a day (BID) | ORAL | 3 refills | Status: AC

## 2023-12-30 MED ORDER — RIZATRIPTAN BENZOATE 10 MG PO TBDP: ORAL_TABLET | ORAL | 6 refills | Status: DC

## 2023-12-30 NOTE — Telephone Encounter (Signed)
 Pt called asking if it is possible to still be seen, phone rep checked with Isa Manuel, NP who said as long as she is stable she can be worked in, pt making her way to check in

## 2023-12-30 NOTE — Patient Instructions (Signed)
 Great to see you today.  Will continue current medications.  Your primary care doctor can refill your medications going forward.  Please return for new or worsening symptoms.  Thanks!!

## 2024-01-04 DIAGNOSIS — R92331 Mammographic heterogeneous density, right breast: Secondary | ICD-10-CM | POA: Diagnosis not present

## 2024-01-04 DIAGNOSIS — D360 Benign neoplasm of lymph nodes: Secondary | ICD-10-CM | POA: Diagnosis not present

## 2024-01-04 DIAGNOSIS — N644 Mastodynia: Secondary | ICD-10-CM | POA: Diagnosis not present

## 2024-01-06 DIAGNOSIS — Z0000 Encounter for general adult medical examination without abnormal findings: Secondary | ICD-10-CM | POA: Diagnosis not present

## 2024-01-06 DIAGNOSIS — G43909 Migraine, unspecified, not intractable, without status migrainosus: Secondary | ICD-10-CM | POA: Diagnosis not present

## 2024-01-25 DIAGNOSIS — L299 Pruritus, unspecified: Secondary | ICD-10-CM | POA: Diagnosis not present

## 2024-02-19 DIAGNOSIS — H04123 Dry eye syndrome of bilateral lacrimal glands: Secondary | ICD-10-CM | POA: Diagnosis not present

## 2024-03-31 DIAGNOSIS — M79643 Pain in unspecified hand: Secondary | ICD-10-CM | POA: Diagnosis not present

## 2024-03-31 DIAGNOSIS — R768 Other specified abnormal immunological findings in serum: Secondary | ICD-10-CM | POA: Diagnosis not present

## 2024-03-31 DIAGNOSIS — M1990 Unspecified osteoarthritis, unspecified site: Secondary | ICD-10-CM | POA: Diagnosis not present

## 2024-03-31 DIAGNOSIS — R5383 Other fatigue: Secondary | ICD-10-CM | POA: Diagnosis not present

## 2024-03-31 DIAGNOSIS — M549 Dorsalgia, unspecified: Secondary | ICD-10-CM | POA: Diagnosis not present

## 2024-05-03 DIAGNOSIS — Z1231 Encounter for screening mammogram for malignant neoplasm of breast: Secondary | ICD-10-CM | POA: Diagnosis not present

## 2024-08-16 ENCOUNTER — Other Ambulatory Visit: Payer: Self-pay

## 2024-08-16 ENCOUNTER — Other Ambulatory Visit: Payer: Self-pay | Admitting: Neurology

## 2024-08-20 ENCOUNTER — Ambulatory Visit
Admission: EM | Admit: 2024-08-20 | Discharge: 2024-08-20 | Disposition: A | Discharge status: Home / Self Care | Attending: Student | Admitting: Student

## 2024-08-20 ENCOUNTER — Encounter: Payer: Self-pay | Admitting: Emergency Medicine

## 2024-08-20 DIAGNOSIS — K2100 Gastro-esophageal reflux disease with esophagitis, without bleeding: Secondary | ICD-10-CM | POA: Diagnosis not present

## 2024-08-20 MED ORDER — LIDOCAINE VISCOUS HCL 2 % MT SOLN
15.0000 mL | Freq: Once | OROMUCOSAL | Status: AC
  Administered 2024-08-20: 15 mL via OROMUCOSAL

## 2024-08-20 MED ORDER — ALUM & MAG HYDROXIDE-SIMETH 200-200-20 MG/5ML PO SUSP
30.0000 mL | Freq: Once | ORAL | Status: AC
  Administered 2024-08-20: 30 mL via ORAL

## 2024-08-20 MED ORDER — FAMOTIDINE 20 MG PO TABS: 20.0000 mg | ORAL_TABLET | Freq: Two times a day (BID) | ORAL | 0 refills | Status: AC

## 2024-08-20 NOTE — ED Provider Notes (Signed)
 " Kerry Ortiz [facility]    CSN: [id number] Arrival date & time: 08/20/24  1339      History   Chief Complaint Chief Complaint  Patient presents with   Cough    sore   Sore Throat   Gastroesophageal Reflux    HPI Kerry Ortiz is a 59 y.o. female -Describes burning in throat since 08/17/2024, and cough as well.  -Today, she describes the sore throat as mild. Describes as irritated and burning sensation. This wakes her at night. Also with hoarse voice.  -Cough is causing chest wall pain only with coughing. -H/o asthma, has required albuterol  4x in last 24 hours with improvement. -Denies abd pain - She is taking Nexium  bid,, sucralfate  qid  HPI  Past Medical History:  Diagnosis Date   Acid reflux    Asthma    Degenerative disc disease, lumbar    Disturbance of skin sensation    Fibromyalgia    Migraine    Uterine fibroid     Patient Active Problem List   Diagnosis Date Noted   Throat irritation 03/26/2021   Vision changes 01/16/2021   Chronic migraine w/o aura w/o status migrainosus, not intractable 06/26/2020   Chronic right-sided low back pain with right-sided sciatica 06/26/2020   Right ear impacted cerumen 05/24/2020   Atypical chest pain 01/06/2020   Pes anserinus bursitis of left knee 05/03/2019   Chondromalacia of left patella 05/03/2019   Diffuse pain 02/08/2019   Right lumbar radiculopathy 02/08/2019   Chronic cough 02/25/2018   Upper airway cough syndrome 11/03/2017   Temporomandibular joint (TMJ) pain 09/02/2017   Referred otalgia of right ear 09/02/2017   Leg weakness 05/07/2017   Hypokalemia 05/07/2017   Anemia 05/07/2017   Vertigo 05/07/2017   Chronic migraine 04/23/2017   Paresthesia 02/07/2016   ABDOMINAL BLOATING 11/08/2008   EPIGASTRIC PAIN 11/08/2008   DEPRESSION 10/31/2008   OSTEOARTHRITIS 10/31/2008   FIBROMYALGIA 10/31/2008   HEADACHE, CHRONIC 10/31/2008   ABDOMINAL PAIN, RECURRENT 10/31/2008   CARPAL TUNNEL SYNDROME, HX OF  10/31/2008    Past Surgical History:  Procedure Laterality Date   CARPAL TUNNEL RELEASE     CYSTECTOMY     HAND SURGERY Right    SHOULDER SURGERY Right     OB History   No obstetric history on file.      Home Medications    Prior to Admission medications  Medication Sig Start Date End Date Taking? Authorizing Provider  famotidine  (PEPCID ) 20 MG tablet Take 1 tablet (20 mg total) by mouth 2 (two) times daily. 08/20/24 09/19/24 Yes Artesha Wemhoff E, PA-C  albuterol  (PROVENTIL  HFA;VENTOLIN  HFA) 108 (90 BASE) MCG/ACT inhaler Inhale 2 puffs into the lungs every 6 (six) hours as needed for wheezing or shortness of breath. 10/24/14   Joane Artist RAMAN, MD  amitriptyline  (ELAVIL ) 25 MG tablet Take 1 tablet (25 mg total) by mouth at bedtime. 12/30/23   Gayland Lauraine PARAS, NP  citalopram  (CELEXA ) 10 MG tablet Take 10 mg by mouth daily. 08/10/23   [provider]  diclofenac  (VOLTAREN ) 75 MG EC tablet Take 75 mg by mouth 2 (two) times daily. 08/31/23   [provider]  esomeprazole  (NEXIUM ) 40 MG capsule TAKE ONE CAPSULE BY MOUTH TWICE DAILY, 30 TO 60 MINUTES BEFORE FIRST AND LAST MEAL OF THE DAY Patient taking differently: Take 40 mg by mouth 2 (two) times daily before a meal. 09/13/19   Darlean Ozell NOVAK, MD  fluticasone  (FLONASE ) 50 MCG/ACT nasal spray Place 2  sprays into both nostrils daily. 02/14/21   Mortenson, Ashley, MD  guaiFENesin -dextromethorphan (ROBITUSSIN DM) 100-10 MG/5ML syrup Take 5 mLs by mouth every 4 (four) hours as needed for cough.    [provider]  hydroxychloroquine (PLAQUENIL) 200 MG tablet Take 400 mg by mouth daily.     [provider]  levocetirizine (XYZAL ) 5 MG tablet Take 1 tablet (5 mg total) by mouth every evening. 05/08/22   Darlean Ozell NOVAK, MD  meclizine  (ANTIVERT ) 25 MG tablet Take 1 tablet (25 mg total) by mouth 3 (three) times daily as needed for dizziness. 03/23/20   Palumbo, April, MD  montelukast (SINGULAIR) 10 MG tablet Take 10 mg by  mouth at bedtime.  04/28/19   [provider]  ondansetron  (ZOFRAN  ODT) 4 MG disintegrating tablet Take 1 tablet (4 mg total) by mouth every 8 (eight) hours as needed. 12/30/23   Gayland Lauraine PARAS, NP  pregabalin  (LYRICA ) 200 MG capsule Take 200 mg by mouth 2 (two) times daily.    [provider]  rizatriptan  (MAXALT -MLT) 10 MG disintegrating tablet DISSOLVE 1 TABLET ON THE TONGUE AT ONSET OF MIGRAINE. MAY REPEAT IN 2 HOURS, AS NEEDED. MAX DOSE: 2 TABLETS DAILY. 08/16/24   Gayland Lauraine PARAS, NP  sucralfate  (CARAFATE ) 1 g tablet Take 1 tablet (1 g total) by mouth 4 (four) times daily -  with meals and at bedtime. 03/31/23   Ward, Harlene PEDLAR, PA-C  SUMAtriptan  6 MG/0.5ML SOAJ Take 1 injection at onset of migraine.  May repeat in 2 hrs, if needed.  Max dose: 2 injections/day. This is a 30 day prescription. 12/30/23   Gayland Lauraine PARAS, NP  tiZANidine  (ZANAFLEX ) 4 MG tablet Take 4 mg by mouth every 6 (six) hours as needed for muscle spasms.    [provider]  zonisamide  (ZONEGRAN ) 100 MG capsule Take 1 capsule (100 mg total) by mouth 2 (two) times daily. 12/30/23   Gayland Lauraine PARAS, NP    Family History Family History  Problem Relation Age of Onset   Hypertension Mother    Arrhythmia Mother    Thyroid  disease Mother    Pneumonia Father    Diabetes Maternal Grandmother    Kidney disease Sister    Multiple sclerosis Cousin     Social History Social History[1]   Allergies   Amoxicillin    Review of Systems Review of Systems  HENT:  Positive for sore throat.      Physical Exam Triage Vital Signs ED Triage Vitals  Encounter Vitals Group     BP 08/20/24 1452 112/72     Girls Systolic BP Percentile --      Girls Diastolic BP Percentile --      Boys Systolic BP Percentile --      Boys Diastolic BP Percentile --      Pulse Rate 08/20/24 1452 66     Resp 08/20/24 1452 16     Temp 08/20/24 1452 97.9 F (36.6 C)     Temp Source 08/20/24 1452 Oral     SpO2 08/20/24 1452 98 %      Weight --      Height --      Head Circumference --      Peak Flow --      Pain Score 08/20/24 1451 7     Pain Loc --      Pain Education --      Exclude from Growth Chart --    No data found.  Updated Vital  Signs BP 112/72 (BP Location: Left Arm)   Pulse 66   Temp 97.9 F (36.6 C) (Oral)   Resp 16   LMP  (LMP Unknown) Comment: pt states before 2020  SpO2 98%   Visual Acuity Right Eye Distance:   Left Eye Distance:   Bilateral Distance:    Right Eye Near:   Left Eye Near:    Bilateral Near:     Physical Exam Vitals reviewed.  Constitutional:      General: She is not in acute distress.    Appearance: Normal appearance. She is not ill-appearing.  HENT:     Head: Normocephalic and atraumatic.     Right Ear: Tympanic membrane, ear canal and external ear normal. No tenderness. No middle ear effusion. There is no impacted cerumen. Tympanic membrane is not perforated, erythematous, retracted or bulging.     Left Ear: Tympanic membrane, ear canal and external ear normal. No tenderness.  No middle ear effusion. There is no impacted cerumen. Tympanic membrane is not perforated, erythematous, retracted or bulging.     Nose: Nose normal. No congestion.     Mouth/Throat:     Mouth: Mucous membranes are moist.     Pharynx: Uvula midline. No oropharyngeal exudate or posterior oropharyngeal erythema.     Tonsils: No tonsillar exudate.     Comments: Tonsils are small.  Minimal posterior pharyngeal erythema. Eyes:     Extraocular Movements: Extraocular movements intact.     Pupils: Pupils are equal, round, and reactive to light.  Cardiovascular:     Rate and Rhythm: Normal rate and regular rhythm.     Heart sounds: Normal heart sounds.  Pulmonary:     Effort: Pulmonary effort is normal.     Breath sounds: Normal breath sounds. No decreased breath sounds, wheezing, rhonchi or rales.  Abdominal:     Palpations: Abdomen is soft.     Tenderness: There is no abdominal tenderness.  There is no guarding or rebound.     Comments: No epigastric pain to palpation.  No tenderness to palpation.  Lymphadenopathy:     Cervical: No cervical adenopathy.     Right cervical: No superficial, deep or posterior cervical adenopathy.    Left cervical: No superficial, deep or posterior cervical adenopathy.  Skin:    Comments: No rash   Neurological:     General: No focal deficit present.     Mental Status: She is alert and oriented to person, place, and time.  Psychiatric:        Mood and Affect: Mood normal.        Behavior: Behavior normal.        Thought Content: Thought content normal.        Judgment: Judgment normal.      UC Treatments / Results  Labs (all labs ordered are listed, but only abnormal results are displayed) Labs Reviewed - No data to display  EKG   Radiology No results found.  Procedures Procedures (including critical care time)  Medications Ordered in UC Medications  lidocaine  (XYLOCAINE ) 2 % viscous mouth solution 15 mL (15 mLs Mouth/Throat Given 08/20/24 1511)  alum & mag hydroxide-simeth (MAALOX/MYLANTA) 200-200-20 MG/5ML suspension 30 mL (30 mLs Oral Given 08/20/24 1511)    Initial Impression / Assessment and Plan / UC Course  I have reviewed the triage vital signs and the nursing notes.  Pertinent labs & imaging results that were available during my care of the patient were reviewed by me and considered in  my medical decision making (see chart for details).     Patient is a pleasant 58 y.o. female presenting with reflux with esophagitis. The patient is afebrile and nontachycardic.  Antipyretic has not been administered today.  Postmenopausal.  On exam, she does not have epigastric pain, though based on history, I think she has esophagitis.  Following GI cocktail, improvement in burning sore throat sensation.  She is already taking esomeprazole  and sucralfate .  Will add famotidine .  Follow-up with PCP for further guidance.  Final  Clinical Impressions(s) / UC Diagnoses   Final diagnoses:  Gastroesophageal reflux disease with esophagitis without hemorrhage     Discharge Instructions      -Continue esomeprazole  and sucralfate , and add famotidine  twice daily. -Limit acidic food, spicy food, caffeine/coffee on empty stomach. -Follow-up with primary care provider in 2 to 3 weeks or sooner if symptoms worsen.,      ED Prescriptions     Medication Sig Dispense Auth. Provider   famotidine  (PEPCID ) 20 MG tablet Take 1 tablet (20 mg total) by mouth 2 (two) times daily. 60 tablet Nathaniel Wakeley E, PA-C      PDMP not reviewed this encounter.     [1]  Social History Tobacco Use   Smoking status: Never   Smokeless tobacco: Never  Vaping Use   Vaping status: Never Used  Substance Use Topics   Alcohol use: No   Drug use: No     Arlyss Leita BRAVO, PA-C 08/20/24 1553  "

## 2024-08-20 NOTE — ED Triage Notes (Signed)
 Patient has a burning in her throat  that started on 08/17/2024 and a cough that started that day as well.  Patient has acid flux and does not know if this is causing everything.  Patient is having chest pain from cough.  Patient has hoarseness.  Patient is taking prescribe medication.

## 2024-08-20 NOTE — Discharge Instructions (Addendum)
-  Continue esomeprazole  and sucralfate , and add famotidine  twice daily. -Limit acidic food, spicy food, caffeine/coffee on empty stomach. -Follow-up with primary care provider in 2 to 3 weeks or sooner if symptoms worsen.,
